# Patient Record
Sex: Female | Born: 1935 | Race: White | Hispanic: No | Smoking: Never smoker
Health system: Southern US, Community
[De-identification: ages and names within clinical notes are randomized; demographics above are authoritative.]

## PROBLEM LIST (undated history)

## (undated) DIAGNOSIS — Z860100 Personal history of colon polyps, unspecified: Secondary | ICD-10-CM

## (undated) DIAGNOSIS — B019 Varicella without complication: Secondary | ICD-10-CM

## (undated) DIAGNOSIS — C801 Malignant (primary) neoplasm, unspecified: Secondary | ICD-10-CM

## (undated) DIAGNOSIS — J45909 Unspecified asthma, uncomplicated: Secondary | ICD-10-CM

## (undated) DIAGNOSIS — Z85048 Personal history of other malignant neoplasm of rectum, rectosigmoid junction, and anus: Secondary | ICD-10-CM

## (undated) DIAGNOSIS — K921 Melena: Secondary | ICD-10-CM

## (undated) HISTORY — DX: Varicella without complication: B01.9

## (undated) HISTORY — PX: TONSILLECTOMY AND ADENOIDECTOMY: SUR1326

## (undated) HISTORY — DX: Melena: K92.1

---

## 1999-07-31 ENCOUNTER — Ambulatory Visit (HOSPITAL_COMMUNITY): Admission: RE | Admit: 1999-07-31 | Discharge: 1999-07-31 | Payer: Self-pay | Admitting: Obstetrics and Gynecology

## 1999-07-31 ENCOUNTER — Encounter: Payer: Self-pay | Admitting: Obstetrics and Gynecology

## 2000-05-03 ENCOUNTER — Encounter: Payer: Self-pay | Admitting: Specialist

## 2000-05-03 ENCOUNTER — Ambulatory Visit (HOSPITAL_COMMUNITY): Admission: RE | Admit: 2000-05-03 | Discharge: 2000-05-03 | Payer: Self-pay | Admitting: Specialist

## 2000-05-16 ENCOUNTER — Ambulatory Visit (HOSPITAL_COMMUNITY): Admission: RE | Admit: 2000-05-16 | Discharge: 2000-05-16 | Payer: Self-pay | Admitting: Internal Medicine

## 2000-05-16 ENCOUNTER — Encounter: Payer: Self-pay | Admitting: Internal Medicine

## 2001-01-29 ENCOUNTER — Other Ambulatory Visit: Admission: RE | Admit: 2001-01-29 | Discharge: 2001-01-29 | Payer: Self-pay | Admitting: Obstetrics and Gynecology

## 2002-02-13 ENCOUNTER — Encounter: Payer: Self-pay | Admitting: Obstetrics and Gynecology

## 2002-02-13 ENCOUNTER — Ambulatory Visit (HOSPITAL_COMMUNITY): Admission: RE | Admit: 2002-02-13 | Discharge: 2002-02-13 | Payer: Self-pay | Admitting: Obstetrics and Gynecology

## 2004-03-02 ENCOUNTER — Ambulatory Visit (HOSPITAL_COMMUNITY): Admission: RE | Admit: 2004-03-02 | Discharge: 2004-03-02 | Payer: Self-pay | Admitting: Obstetrics and Gynecology

## 2006-07-09 HISTORY — PX: OTHER SURGICAL HISTORY: SHX169

## 2006-12-27 ENCOUNTER — Ambulatory Visit: Payer: Self-pay | Admitting: Gastroenterology

## 2006-12-27 LAB — CONVERTED CEMR LAB
AST: 16 units/L (ref 0–37)
BUN: 15 mg/dL (ref 6–23)
Basophils Absolute: 0.1 10*3/uL (ref 0.0–0.1)
Bilirubin, Direct: 0.1 mg/dL (ref 0.0–0.3)
CEA: 33.6 ng/mL — ABNORMAL HIGH (ref 0.0–5.0)
CO2: 32 meq/L (ref 19–32)
Creatinine, Ser: 0.7 mg/dL (ref 0.4–1.2)
Eosinophils Absolute: 0.2 10*3/uL (ref 0.0–0.6)
GFR calc non Af Amer: 86 mL/min
Iron: 40 ug/dL — ABNORMAL LOW (ref 42–145)
Lymphocytes Relative: 13 % (ref 12.0–46.0)
Monocytes Absolute: 0.6 10*3/uL (ref 0.2–0.7)
Platelets: 280 10*3/uL (ref 150–400)
Potassium: 4.5 meq/L (ref 3.5–5.1)
RBC: 3.78 M/uL — ABNORMAL LOW (ref 3.87–5.11)
Saturation Ratios: 17.5 % — ABNORMAL LOW (ref 20.0–50.0)
Sodium: 142 meq/L (ref 135–145)
TSH: 1.64 microintl units/mL (ref 0.35–5.50)
Transferrin: 163.7 mg/dL — ABNORMAL LOW (ref >=212.0)
WBC: 6.3 10*3/uL (ref 4.5–10.5)

## 2007-01-01 ENCOUNTER — Encounter: Payer: Self-pay | Admitting: Gastroenterology

## 2007-01-01 ENCOUNTER — Ambulatory Visit: Payer: Self-pay | Admitting: Gastroenterology

## 2007-01-02 ENCOUNTER — Ambulatory Visit: Payer: Self-pay | Admitting: Cardiology

## 2007-01-14 DIAGNOSIS — Z85048 Personal history of other malignant neoplasm of rectum, rectosigmoid junction, and anus: Secondary | ICD-10-CM | POA: Diagnosis present

## 2007-01-15 ENCOUNTER — Inpatient Hospital Stay (HOSPITAL_COMMUNITY): Admission: RE | Admit: 2007-01-15 | Discharge: 2007-01-23 | Payer: Self-pay | Admitting: Surgery

## 2007-01-15 ENCOUNTER — Encounter (INDEPENDENT_AMBULATORY_CARE_PROVIDER_SITE_OTHER): Payer: Self-pay | Admitting: Surgery

## 2007-01-15 DIAGNOSIS — C20 Malignant neoplasm of rectum: Secondary | ICD-10-CM

## 2007-01-15 HISTORY — PX: LOW ANTERIOR BOWEL RESECTION: SUR1240

## 2007-01-15 HISTORY — DX: Malignant neoplasm of rectum: C20

## 2007-01-28 ENCOUNTER — Ambulatory Visit: Payer: Self-pay | Admitting: Oncology

## 2007-02-20 LAB — CBC WITH DIFFERENTIAL/PLATELET
BASO%: 0.6 % (ref 0.0–2.0)
LYMPH%: 16.2 % (ref 14.0–48.0)
MCV: 87.5 fL (ref 81.0–101.0)
MONO%: 7.7 % (ref 0.0–13.0)
NEUT#: 4.5 10*3/uL (ref 1.5–6.5)
NEUT%: 72.2 % (ref 39.6–76.8)
Platelets: 254 10*3/uL (ref 145–400)
RBC: 3.44 10*6/uL — ABNORMAL LOW (ref 3.70–5.32)
WBC: 6.3 10*3/uL (ref 3.9–10.0)

## 2007-02-20 LAB — CEA: CEA: <0.5 ng/mL (ref 0.0–5.0)

## 2007-02-20 LAB — IRON AND TIBC
%SAT: 10 % — ABNORMAL LOW (ref 20–55)
Iron: 22 ug/dL — ABNORMAL LOW (ref 42–145)
UIBC: 207 ug/dL

## 2007-02-20 LAB — COMPREHENSIVE METABOLIC PANEL
Alkaline Phosphatase: 61 U/L (ref 39–117)
CO2: 28 mEq/L (ref 19–32)
Chloride: 105 mEq/L (ref 96–112)
Creatinine, Ser: 0.82 mg/dL (ref 0.40–1.20)
Potassium: 4.1 mEq/L (ref 3.5–5.3)
Total Bilirubin: 0.5 mg/dL (ref 0.3–1.2)

## 2007-02-20 LAB — FERRITIN: Ferritin: 445 ng/mL — ABNORMAL HIGH (ref 10–291)

## 2007-02-20 LAB — LACTATE DEHYDROGENASE: LDH: 164 U/L (ref 94–250)

## 2007-02-26 ENCOUNTER — Ambulatory Visit: Admission: RE | Admit: 2007-02-26 | Discharge: 2007-04-08 | Payer: Self-pay | Admitting: Radiation Oncology

## 2007-03-20 ENCOUNTER — Ambulatory Visit: Payer: Self-pay | Admitting: Oncology

## 2007-03-20 LAB — CBC WITH DIFFERENTIAL/PLATELET
EOS%: 1.1 % (ref 0.0–7.0)
HGB: 11.3 g/dL — ABNORMAL LOW (ref 11.6–15.9)
MCV: 87.6 fL (ref 81.0–101.0)
NEUT#: 5.2 10*3/uL (ref 1.5–6.5)
NEUT%: 79 % — ABNORMAL HIGH (ref 39.6–76.8)
RBC: 3.84 10*6/uL (ref 3.70–5.32)
RDW: 15.6 % — ABNORMAL HIGH (ref 11.3–14.5)
WBC: 6.6 10*3/uL (ref 3.9–10.0)

## 2007-04-21 ENCOUNTER — Ambulatory Visit (HOSPITAL_COMMUNITY): Admission: RE | Admit: 2007-04-21 | Discharge: 2007-04-21 | Payer: Self-pay | Admitting: Obstetrics and Gynecology

## 2007-04-25 ENCOUNTER — Encounter: Admission: RE | Admit: 2007-04-25 | Discharge: 2007-04-25 | Payer: Self-pay | Admitting: Obstetrics and Gynecology

## 2007-05-16 ENCOUNTER — Ambulatory Visit: Payer: Self-pay | Admitting: Oncology

## 2007-05-20 LAB — COMPREHENSIVE METABOLIC PANEL
CO2: 29 mEq/L (ref 19–32)
Calcium: 9.4 mg/dL (ref 8.4–10.5)
Creatinine, Ser: 0.81 mg/dL (ref 0.40–1.20)
Glucose, Bld: 124 mg/dL — ABNORMAL HIGH (ref 70–99)
Sodium: 145 mEq/L (ref 135–145)
Total Bilirubin: 0.6 mg/dL (ref 0.3–1.2)

## 2007-05-20 LAB — CBC WITH DIFFERENTIAL/PLATELET
BASO%: 1 % (ref 0.0–2.0)
Basophils Absolute: 0.1 10*3/uL (ref 0.0–0.1)
EOS%: 1.4 % (ref 0.0–7.0)
HGB: 11.7 g/dL (ref 11.6–15.9)
MCHC: 33.3 g/dL (ref 32.0–36.0)
MONO#: 0.5 10*3/uL (ref 0.1–0.9)
RBC: 4.03 10*6/uL (ref 3.70–5.32)
RDW: 16.5 % — ABNORMAL HIGH (ref 11.3–14.5)
lymph#: 1 10*3/uL (ref 0.9–3.3)

## 2007-05-20 LAB — LACTATE DEHYDROGENASE: LDH: 149 U/L (ref 94–250)

## 2007-05-20 LAB — CEA: CEA: <0.5 ng/mL (ref 0.0–5.0)

## 2007-07-10 LAB — HM PAP SMEAR

## 2007-07-18 ENCOUNTER — Ambulatory Visit: Payer: Self-pay | Admitting: Oncology

## 2007-07-22 LAB — CBC WITH DIFFERENTIAL/PLATELET
BASO%: 0.6 % (ref 0.0–2.0)
EOS%: 1.5 % (ref 0.0–7.0)
HCT: 36.6 % (ref 34.8–46.6)
HGB: 12.1 g/dL (ref 11.6–15.9)
LYMPH%: 15.2 % (ref 14.0–48.0)
MCH: 29.3 pg (ref 26.0–34.0)
MCHC: 33.2 g/dL (ref 32.0–36.0)
MONO%: 6.8 % (ref 0.0–13.0)

## 2007-07-22 LAB — COMPREHENSIVE METABOLIC PANEL
CO2: 30 mEq/L (ref 19–32)
Calcium: 9.1 mg/dL (ref 8.4–10.5)
Chloride: 105 mEq/L (ref 96–112)
Glucose, Bld: 125 mg/dL — ABNORMAL HIGH (ref 70–99)
Sodium: 142 mEq/L (ref 135–145)
Total Bilirubin: 0.5 mg/dL (ref 0.3–1.2)
Total Protein: 7 g/dL (ref 6.0–8.3)

## 2007-07-22 LAB — LACTATE DEHYDROGENASE: LDH: 143 U/L (ref 94–250)

## 2007-07-22 LAB — CEA: CEA: <0.5 ng/mL (ref 0.0–5.0)

## 2007-10-16 ENCOUNTER — Ambulatory Visit: Payer: Self-pay | Admitting: Oncology

## 2007-10-21 LAB — CBC WITH DIFFERENTIAL/PLATELET
BASO%: 0.8 % (ref 0.0–2.0)
HGB: 13 g/dL (ref 11.6–15.9)
MCH: 30.4 pg (ref 26.0–34.0)
MONO#: 0.5 10*3/uL (ref 0.1–0.9)
MONO%: 7.1 % (ref 0.0–13.0)
NEUT#: 4.8 10*3/uL (ref 1.5–6.5)
Platelets: 230 10*3/uL (ref 145–400)
RBC: 4.27 10*6/uL (ref 3.70–5.32)
WBC: 6.5 10*3/uL (ref 3.9–10.0)
lymph#: 1 10*3/uL (ref 0.9–3.3)

## 2007-10-21 LAB — COMPREHENSIVE METABOLIC PANEL
BUN: 19 mg/dL (ref 6–23)
Creatinine, Ser: 0.83 mg/dL (ref 0.40–1.20)
Glucose, Bld: 107 mg/dL — ABNORMAL HIGH (ref 70–99)
Sodium: 143 mEq/L (ref 135–145)
Total Bilirubin: 0.6 mg/dL (ref 0.3–1.2)
Total Protein: 7.3 g/dL (ref 6.0–8.3)

## 2007-10-21 LAB — LACTATE DEHYDROGENASE: LDH: 149 U/L (ref 94–250)

## 2007-11-04 DIAGNOSIS — R1032 Left lower quadrant pain: Secondary | ICD-10-CM

## 2007-11-04 DIAGNOSIS — R142 Eructation: Secondary | ICD-10-CM

## 2007-11-04 DIAGNOSIS — R141 Gas pain: Secondary | ICD-10-CM

## 2007-11-04 DIAGNOSIS — R143 Flatulence: Secondary | ICD-10-CM

## 2008-02-17 ENCOUNTER — Ambulatory Visit: Payer: Self-pay | Admitting: Oncology

## 2008-03-23 ENCOUNTER — Ambulatory Visit: Payer: Self-pay | Admitting: Gastroenterology

## 2008-03-23 DIAGNOSIS — Z85038 Personal history of other malignant neoplasm of large intestine: Secondary | ICD-10-CM | POA: Insufficient documentation

## 2008-04-07 ENCOUNTER — Ambulatory Visit: Payer: Self-pay | Admitting: Gastroenterology

## 2008-04-12 ENCOUNTER — Encounter: Payer: Self-pay | Admitting: Gastroenterology

## 2008-04-12 ENCOUNTER — Ambulatory Visit: Payer: Self-pay | Admitting: Gastroenterology

## 2008-04-17 ENCOUNTER — Encounter: Payer: Self-pay | Admitting: Gastroenterology

## 2008-10-21 IMAGING — CT CT ABDOMEN W/ CM
2 of 7 series · 17 of 46 positions shown, 19 images · IV contrast (Omnipaque 300)
Comparison: None.

ABDOMEN CT WITH CONTRAST

CLINICAL DATA: Heavy rectal bleeding for the past 2 months. Intermittent
abdominal pain and diarrhea.
TECHNIQUE: Multidetector CT imaging of the abdomen and pelvis was performed
following the standard protocol during bolus administration of intravenous
contrast.

Contrast:  100 cc Omnipaque 300

[Series 2: abd_pel 5.0 b30f st · axial · 0.68mm/px · z∈[-395,-40]mm · 14 of 81 slices shown, 16 images]
[im 5/81  soft-tissue]
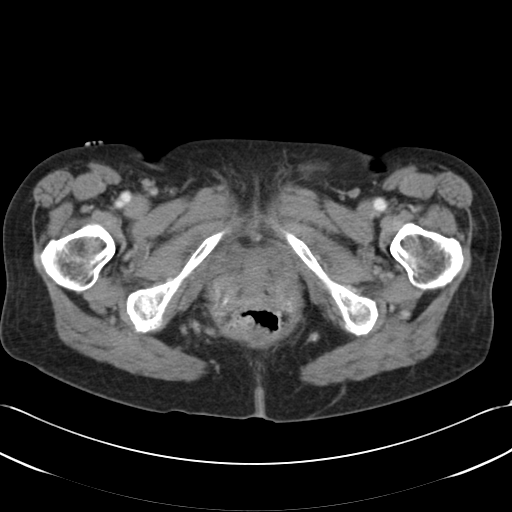
[im 5/81  bone]
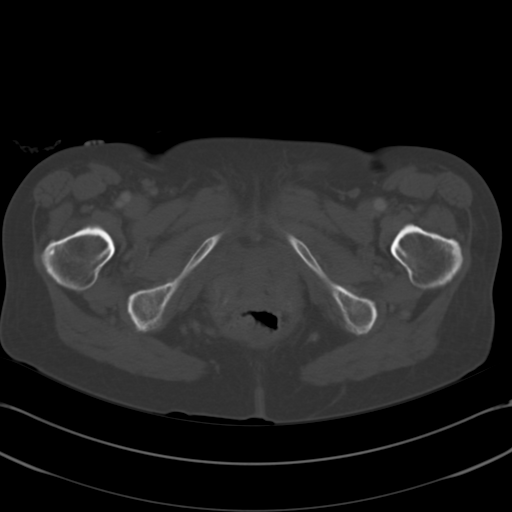
[im 9/81  soft-tissue]
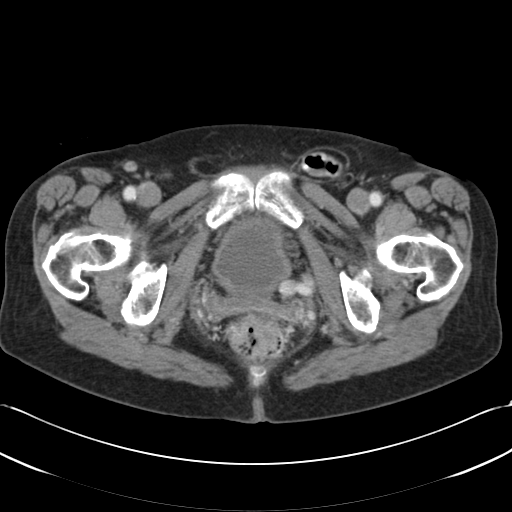
[im 18/81  soft-tissue]
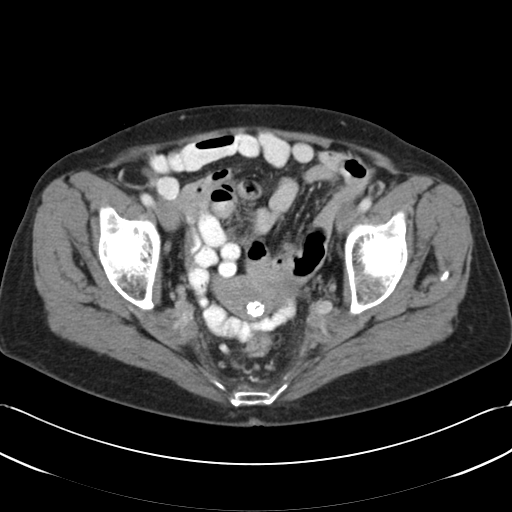
[im 23/81  soft-tissue]
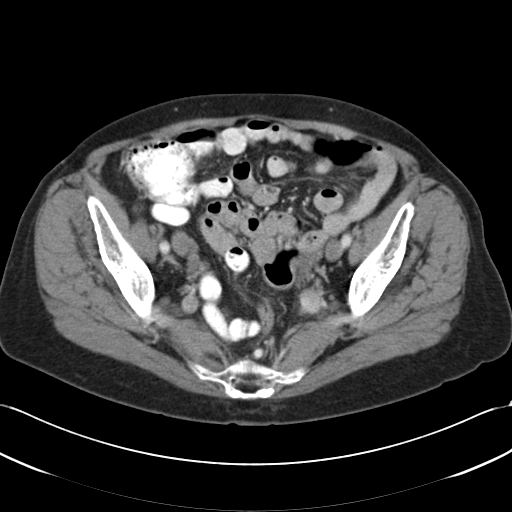
[im 27/81  soft-tissue]
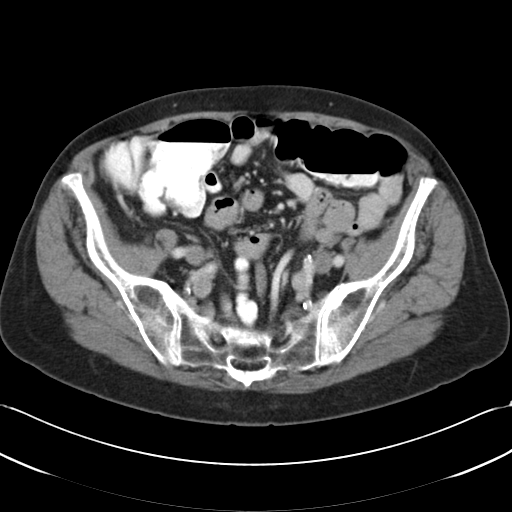
[im 32/81  soft-tissue]
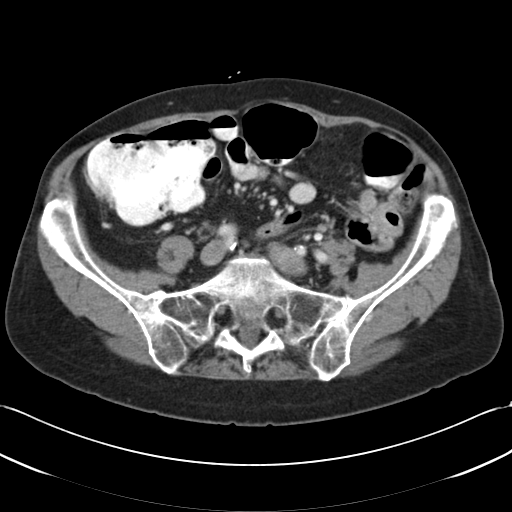
[im 36/81  soft-tissue]
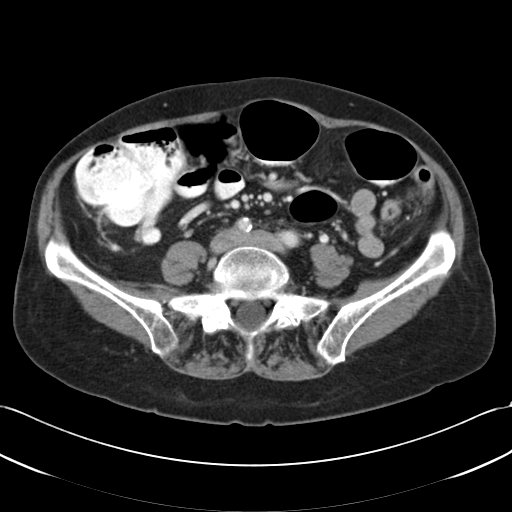
[im 45/81  soft-tissue]
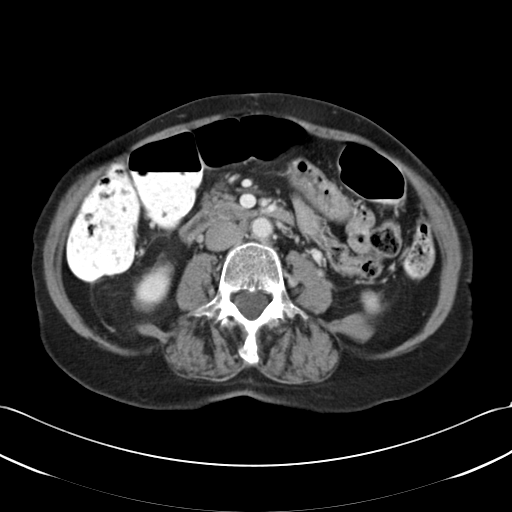
[im 49/81  soft-tissue]
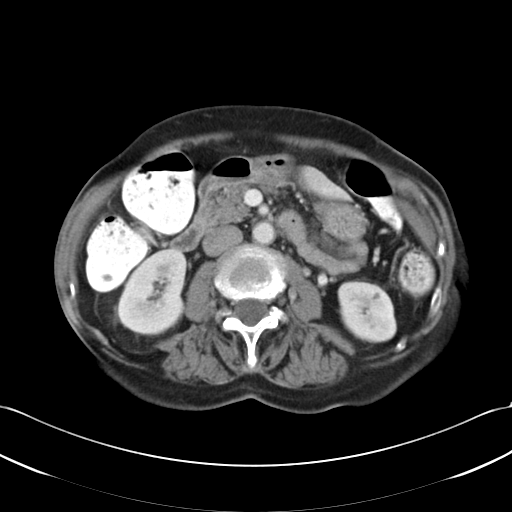
[im 49/81  bone]
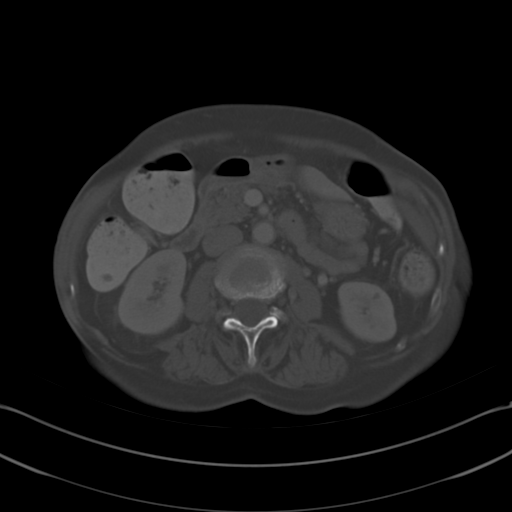
[im 54/81  soft-tissue]
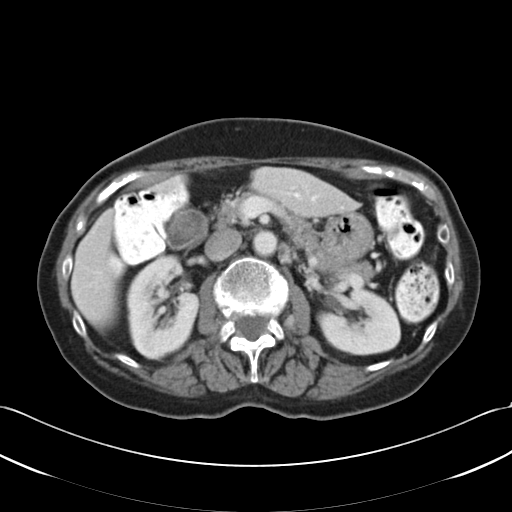
[im 58/81  soft-tissue]
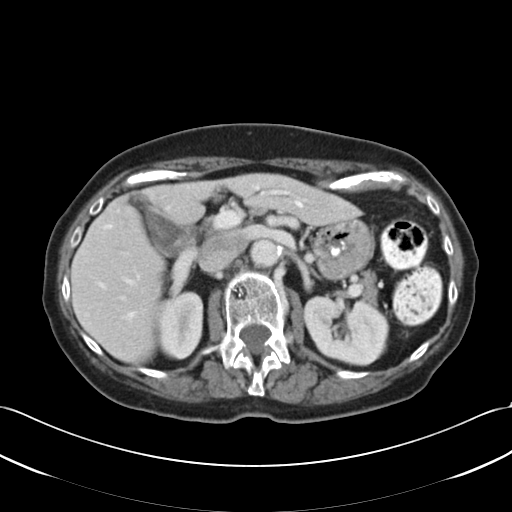
[im 63/81  soft-tissue]
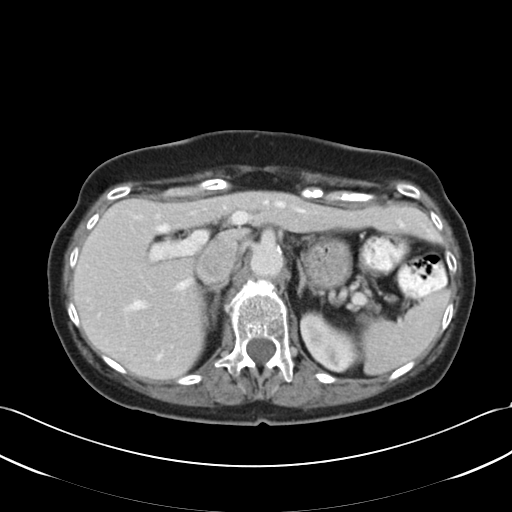
[im 72/81  soft-tissue]
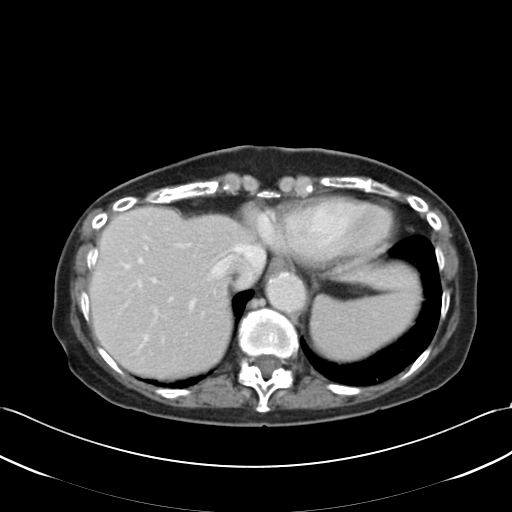
[im 76/81  soft-tissue]
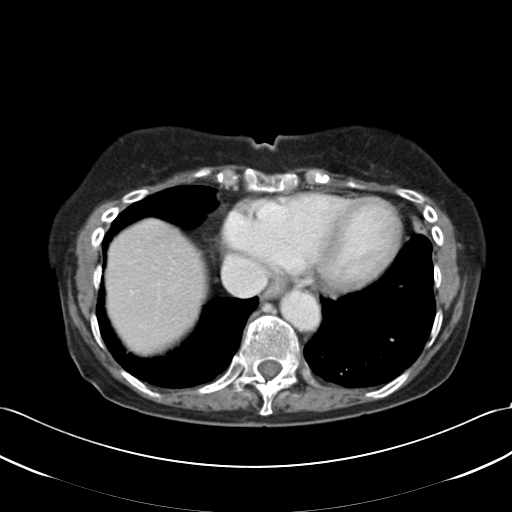

[Series 602: <mpr thick range> · coronal · 0.85mm/px · 3 of 72 slices shown]
[im 18/72  soft-tissue]
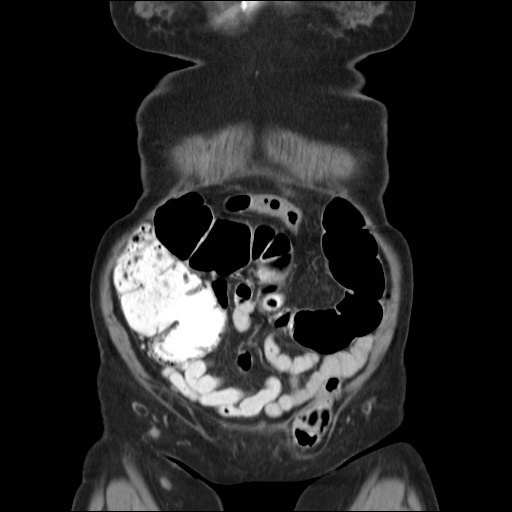
[im 36/72  soft-tissue]
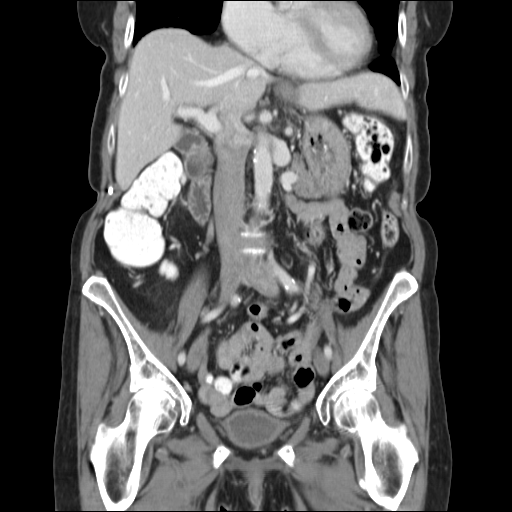
[im 54/72  soft-tissue]
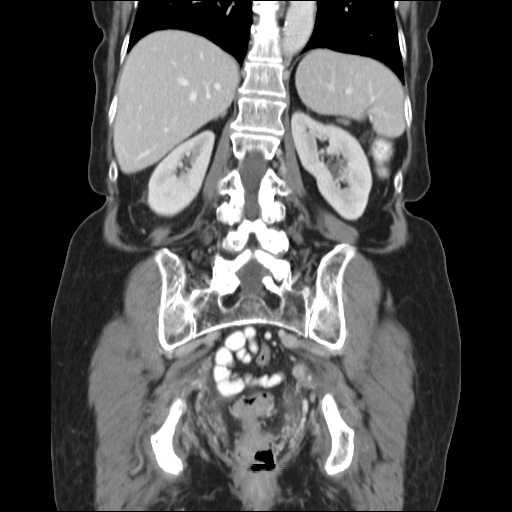

[17 of 46 positions shown; findings below may reference images not displayed]

FINDINGS: 2.3 cm oval area of mild soft tissue density within the gallbladder.
No gallbladder wall thickening or pericholecystic fluid. Tiny lower pole left
renal cyst. Unremarkable liver, spleen, pancreas, and adrenal glands and left
kidney. No intestinal abnormalities or enlarged lymph nodes. Specifically, no
colon mass visualized. Clear lung bases. Lumbar and lower thoracic spine
degenerative changes.

IMPRESSION

2.3 cm probable sludge ball in the gallbladder. No acute abnormality.

PELVIS CT WITH CONTRAST
FINDINGS: Small, calcified posterior uterine fibroid. Normal-appearing ovaries
urinary bladder. Left inguinal hernia containing a small bowel loop without
dilatation or wall thickening. Unremarkable bones.

IMPRESSION

Left inguinal hernia containing a small bowel loop without obstruction.

## 2009-03-17 ENCOUNTER — Encounter: Payer: Self-pay | Admitting: Gastroenterology

## 2009-11-15 ENCOUNTER — Ambulatory Visit: Payer: Self-pay | Admitting: Gastroenterology

## 2009-11-15 LAB — CONVERTED CEMR LAB
ALT: 14 units/L (ref 0–35)
AST: 22 units/L (ref 0–37)
Alkaline Phosphatase: 62 units/L (ref 39–117)
Basophils Absolute: 0.1 10*3/uL (ref 0.0–0.1)
CEA: 0.7 ng/mL (ref 0.0–5.0)
CO2: 33 meq/L — ABNORMAL HIGH (ref 19–32)
Creatinine, Ser: 0.7 mg/dL (ref 0.4–1.2)
GFR calc non Af Amer: 80.78 mL/min (ref >60)
Hemoglobin: 13 g/dL (ref 12.0–15.0)
Lymphocytes Relative: 15.8 % (ref 12.0–46.0)
MCHC: 33.9 g/dL (ref 30.0–36.0)
MCV: 93.2 fL (ref 78.0–100.0)
Monocytes Relative: 8.7 % (ref 3.0–12.0)
Neutrophils Relative %: 73 % (ref 43.0–77.0)
RBC: 4.13 M/uL (ref 3.87–5.11)
RDW: 15.3 % — ABNORMAL HIGH (ref 11.5–14.6)
Saturation Ratios: 19 % — ABNORMAL LOW (ref 20.0–50.0)
TSH: 1.81 microintl units/mL (ref 0.35–5.50)
Total Protein: 6.9 g/dL (ref 6.0–8.3)
Vitamin B-12: 570 pg/mL (ref 211–911)
WBC: 6.6 10*3/uL (ref 4.5–10.5)

## 2009-11-23 ENCOUNTER — Telehealth: Payer: Self-pay | Admitting: Gastroenterology

## 2010-07-30 ENCOUNTER — Encounter (HOSPITAL_COMMUNITY): Payer: Self-pay | Admitting: Oncology

## 2010-08-10 NOTE — Procedures (Signed)
Summary: Recall / [facility]  Recall / [facility]   Imported By: Lennie Odor 12/08/2009 16:49:21  _____________________________________________________________________  External Attachment:    Type:   Image     Comment:   External Document

## 2010-08-10 NOTE — Progress Notes (Signed)
Summary: results  Phone Note Call from Patient Call back at Home Phone [phone]   Caller: Patient Call For: Jarold Motto Reason for Call: Talk to Nurse, Lab or Test Results Summary of Call: Patient would like lab results from 5-10 Initial call taken by: Tawni Levy,  Nov 23, 2009 2:03 PM  Follow-up for Phone Call        Discussed lab results with pt.  Follow-up by: Ashok Cordia RN,  Nov 23, 2009 2:20 PM

## 2010-08-10 NOTE — Assessment & Plan Note (Signed)
Summary: recall office visit, hx colon cancer/lk   History of Present Illness Visit Type: Follow-up Visit Primary GI MD: Sheryn Bison MD FACP FAGA Primary Provider: n/a Requesting Provider: n/a Chief Complaint: Patient here for routine office visit. She has a history of colon cancer. She currently has no GI complaints. History of Present Illness:   77 year old Caucasian female status post low anterior resection for rectal carcinoma in July of 2008. She's had followup colonoscopy proximally year ago which was unremarkable including biopsies of the anastomotic area. She is status post chemotherapy by Dr. Arline Asp. Actually review her record shows no evidence of chemoradiation therapy. She currently has  regular bowel movements and denies abdominal pain, rectal bleeding, or any systemic symptomatology. She doesn't have a primary care physician.She  is not on any medications currently.   GI Review of Systems      Denies abdominal pain, acid reflux, belching, bloating, chest pain, dysphagia with liquids, dysphagia with solids, heartburn, loss of appetite, nausea, vomiting, vomiting blood, weight loss, and  weight gain.        Denies anal fissure, black tarry stools, change in bowel habit, constipation, diarrhea, diverticulosis, fecal incontinence, heme positive stool, hemorrhoids, irritable bowel syndrome, jaundice, light color stool, liver problems, rectal bleeding, and  rectal pain. Preventive Screening-Counseling & Management  Caffeine-Diet-Exercise     Does Patient Exercise: yes    Current Medications (verified): 1)  None  Allergies (verified): 1)  ! Levaquin  Past History:  Past medical, surgical, family and social histories (including risk factors) reviewed for relevance to current acute and chronic problems.  Past Medical History: Reviewed history from 03/23/2008 and no changes required. Current Problems:   ABDOMINAL PAIN, LEFT LOWER QUADRANT (ICD-789.04) ABDOMINAL  BLOATING (ICD-787.3)  Past Surgical History: Reviewed history from 03/23/2008 and no changes required. colon ca surgery low anterior resection 01/15/2007.for adenocarcinoma of the rectum. Tonsillectomy  Family History: Reviewed history from 03/22/2008 and no changes required. No FH of Colon Cancer: Lung Cancer: Father  Social History: Reviewed history from 03/22/2008 and no changes required. Occupation: retired from [employer] Three children one son and 2 daughters Patient has never smoked.  Alcohol Use - no Illicit Drug Use - no Patient gets regular exercise.  Review of Systems       The patient complains of muscle pains/cramps.  The patient denies allergy/sinus, anemia, anxiety-new, arthritis/joint pain, back pain, blood in urine, breast changes/lumps, change in vision, confusion, cough, coughing up blood, depression-new, fainting, fatigue, fever, headaches-new, hearing problems, heart murmur, heart rhythm changes, itching, menstrual pain, night sweats, nosebleeds, pregnancy symptoms, shortness of breath, skin rash, sleeping problems, sore throat, swelling of feet/legs, swollen lymph glands, thirst - excessive , urination - excessive , urination changes/pain, urine leakage, vision changes, and voice change.    Vital Signs:  Patient profile:   77 year old female Height:      64 inches Weight:      129 pounds BMI:     22.22 BSA:     1.62 Pulse rate:   80 / minute Pulse rhythm:   regular BP sitting:   176 / 70  (left arm) Cuff size:   regular  Vitals Entered By: Lamona Curl CMA Duncan Dull) (Nov 15, 2009 2:51 PM)  Physical Exam  General:  Well developed, well nourished, no acute distress.healthy appearing.   Head:  Normocephalic and atraumatic. Eyes:  PERRLA, no icterus.exam deferred to patient's ophthalmologist.   Abdomen:  Soft, nontender and nondistended. No masses, hepatosplenomegaly or hernias  noted. Normal bowel sounds. Rectal:  inspection of the perirectal  area is unremarkable. Rectal exam shows some stenosis in the rectum at the anastomotic site, but I was able to insert my digit without problems. There is no friability or rectal bleeding. Stools guaiac negative. Psych:  Alert and cooperative. Normal mood and affect.depressed affect.     Impression & Recommendations:  Problem # 1:  PERSONAL HX COLON CANCER (ICD-V10.05) Assessment Unchanged Labs ordered including CEA level. I have offered to establish her with care with primary care,but she refused. She also does not want her records sent to oncology. She seems to be doing well at this time and I will see her yearly basis. I do not think colonoscopy is needed just yet. This may change depending on her lab review. Orders: TLB-CBC Platelet - w/Differential (85025-CBCD) TLB-BMP (Basic Metabolic Panel-BMET) (80048-METABOL) TLB-Hepatic/Liver Function Pnl (80076-HEPATIC) TLB-TSH (Thyroid Stimulating Hormone) (84443-TSH) TLB-B12, Serum-Total ONLY (41324-M01) TLB-Ferritin (82728-FER) TLB-Folic Acid (Folate) (82746-FOL) TLB-IBC Pnl (Iron/FE;Transferrin) (83550-IBC) TLB-CEA (Carcinoembryonic Antigen) (82378-CEA)  Problem # 2:  ABDOMINAL PAIN, LEFT LOWER QUADRANT (ICD-789.04) Assessment: Improved  Orders: TLB-CBC Platelet - w/Differential (85025-CBCD) TLB-BMP (Basic Metabolic Panel-BMET) (80048-METABOL) TLB-Hepatic/Liver Function Pnl (80076-HEPATIC) TLB-TSH (Thyroid Stimulating Hormone) (84443-TSH) TLB-B12, Serum-Total ONLY (02725-D66) TLB-Ferritin (82728-FER) TLB-Folic Acid (Folate) (82746-FOL) TLB-IBC Pnl (Iron/FE;Transferrin) (83550-IBC) TLB-CEA (Carcinoembryonic Antigen) (82378-CEA)  Patient Instructions: 1)  Please go to the basement for lab work. 2)  Please continue current medications.  3)  The medication list was reviewed and reconciled.  All changed / newly prescribed medications were explained.  A complete medication list was provided to the patient / caregiver. 4)  Please schedule  a follow-up appointment in 1 year.

## 2010-11-21 NOTE — Assessment & Plan Note (Signed)
[facility]                         GASTROENTEROLOGY OFFICE NOTE   NAME:[name], [name]                        MRN:          [id number]  DATE:12/27/2006                            DOB:          [birth year]    [name] is a very nice 77 year old white female retiree referred by  Dr. Ambrose Faulkner for hematochezia.   [name] has had rectal bleeding almost daily for the last 3 months.  She saw Dr. Ambrose Faulkner yesterday who removed a pessary from her vaginal area  that has been present for several years.  On rectal exam, she was noted  to have a rectal mass.  The patient denies GI symptoms except for some  gas, bloating, and occasional left lower quadrant discomfort.  She has  never had colonoscopy or barium studies.  She says her appetite is good  and her weight is stable, and she denies any systemic complaints such as  fever, chills, anorexia, weight loss, etc.  She denies upper GI or  hepatobiliary problems.   PAST MEDICAL HISTORY:  She is a surprisingly healthy white female who  really has had no medical problems and is on no medications.   FAMILY HISTORY:  Remarkable for lung cancer in her father, but no known  gastrointestinal problems.   SOCIAL HISTORY:  The patient is married and lives with her husband.  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  She  does not smoke or use ethanol.   REVIEW OF SYSTEMS:  Noncontributory except for some nonspecific edema of  her lower extremities and some mild urinary incontinence.  She receives  her gynecologic care per Dr. Ambrose Faulkner.   She is a healthy-appearing white female appearing younger than her  stated age in no acute distress.  She is 5 feet 4 inches tall and weighs 121 pounds.  Blood pressure is  118/72 and pulse was 88 and regular.  There is no stigmata of chronic liver disease noted.  She appeared to be in a regular rhythm without murmurs, gallops, or  rubs.  Her chest was  clear.  I could not appreciate hepatosplenomegaly, abdominal masses, or  tenderness.  Bowel sounds were normal.  Inspection of the rectum was unremarkable.  RECTAL EXAM:  Did show a polypoid, firm, indurated mass in the rectum  with guaiac-positive stools.  Peripheral extremities were unremarkable.  Mental status was clear.   ASSESSMENT:  [name] most likely has a rectal adenocarcinoma per  her symptomatology and rectal exam.   RECOMMENDATIONS:  I have sent her by the lab to:  1. Check repeat lab tests including iron studies.  2. Outpatient colonoscopy as soon as possible for diagnostic purposes.  3. Consider abdominal/pelvic CT scans once colposcopy is completed.     [name]. Jarold Motto, MD, Caleen Essex, FAGA  Electronically Signed    DRP/MedQ  DD: 12/27/2006  DT: 12/27/2006  Job #: [id number]   cc:   [name], M.D.

## 2010-11-21 NOTE — Discharge Summary (Signed)
[name], [name]               ACCOUNT NO.:  [id number]   MEDICAL RECORD NO.:  [id number]          PATIENT TYPE:  INP   LOCATION:  1522                         FACILITY:  [facility]   PHYSICIAN:  Wilmon Arms. Corliss Skains, M.D. DATE OF BIRTH:  24-Feb-1924   DATE OF ADMISSION:  01/15/2007  DATE OF DISCHARGE:  01/23/2007                               DISCHARGE SUMMARY   ADMISSION DIAGNOSIS:  Rectal carcinoma   DISCHARGE DIAGNOSIS:  Rectal carcinoma   BRIEF HISTORY:  The patient is an 77 year old female in good health who  presents with gross blood per rectum over the last 3 months.  She  underwent a colonoscopy on 01/01/2007.  This showed a circumferential  fungating mass in the proximal rectum.  Biopsies confirmed  adenocarcinoma.  She was referred for surgical evaluation.   HOSPITAL COURSE:  The patient underwent a bowel prep at home.  She is  admitted to the hospital on 01/15/2007 and underwent a low anterior  resection.  She had a low stapled anastomosis approximately 3 cm above  the dentate line.  Her pathology showed invasive adenocarcinoma with  negative margins.  17 lymph nodes were examined and these were all  negative.  Postoperatively the patient had an inadvertent postop dose of  Lovenox the evening of her surgery.  This may have contributed to some  oozing from her midline incision.  This was addressed with additional  sutures in the lower midline which succeeded in stopping this bleeding.  The patient was managed conservatively with ice chips and sips of  liquids until postoperative day number five.  She began having flatus.  Her diet was slowly increased and she was transitioned to oral pain  medication.  She was given Milk of Magnesia and oral Dulcolax which  resulted in some diarrhea.  On postoperative day #7 the patient had  significant loose stool.  All of her laxatives were stopped and she was  started fiber supplement.  Her clostridium difficile titer was negative.  She  was discharged home on postop day #8.  Her wound looked good.  Her  bowel movements had decreased in frequency.  She was able to control her  bowel movements without difficulty.   DISCHARGE INSTRUCTIONS:  To follow-up with Dr. Corliss Skains in 1 week for  staple removal.  She should use fiber supplement as needed.  She may  also use Imodium as needed for diarrhea.  She is given a prescription  for Vicodin p.r.n. for pain.  She should refrain from any heavy lifting.      Wilmon Arms. Tsuei, M.D.  Electronically Signed     MKT/MEDQ  D:  02/08/2007  T:  02/09/2007  Job:  [id number]   cc:   Vania Rea. Jarold Motto, MD, FACG, FACP, FAGA  [address]  [place]  [place] [address]

## 2010-11-21 NOTE — Op Note (Signed)
[name], [name]               ACCOUNT NO.:  [id number]   MEDICAL RECORD NO.:  [id number]          PATIENT TYPE:  INP   LOCATION:  0004                         FACILITY:  [facility]   PHYSICIAN:  Wilmon Arms. Corliss Skains, M.D. DATE OF BIRTH:  1924-04-08   DATE OF PROCEDURE:  01/15/2007  DATE OF DISCHARGE:                               OPERATIVE REPORT   PREOPERATIVE DIAGNOSIS:  Rectal carcinoma.   POSTOPERATIVE DIAGNOSIS:  Rectal carcinoma.   PROCEDURE:  Low anterior resection.   SURGEON:  Wilmon Arms. Corliss Skains, M.D.   ASSISTANT:  Anselm Pancoast. Zachery Dakins, M.D.   ANESTHESIA:  General endotracheal.   INDICATIONS:  The patient is a 77 year old female in remarkably good  health who presents with gross blood per rectum over the last three  months.  She underwent evaluation by Dr. Sheryn Bison who performed a  colonoscopy in 01/01/2007.  This showed a circumferential fungating mass  in the proximal rectum.  Biopsies confirmed adenocarcinoma.  The  remainder of the colon was normal.  She is now referred for surgical  evaluation.  Preoperative CEA level was 33.6.  Hemoglobin was 11.0.   DESCRIPTION OF PROCEDURE:  The patient underwent a 2-day bowel prep at  home.  She was admitted to hospital on 01/15/2007 and was brought to the  operating room.  After an adequate level of general anesthesia was  obtained, the patient's legs were placed in yellow fin stirrups in  lithotomy position.  Foley catheter was placed under sterile technique.  The patient's abdomen was then prepped with Betadine and draped in  sterile fashion.  A time-out was taken assure proper patient, proper  procedure.  A lower midline incision was made from umbilicus to the  symphysis pubis.  Dissection was carried down to the fascia which was  opened vertically.  The peritoneal cavity was entered.  A Balfour  retractor was then inserted.  There were no adhesions in the abdomen.  The patient's sigmoid colon was very redundant.  I  palpated the liver.  There were no obvious metastatic lesions noted.  Her small bowel and  omentum appeared normal.  The small bowel and omentum were packed away  in the upper part of the abdomen and packed away behind the Balfour  expander retractor.  The distal sigmoid colon was divided with a GIA 55  stapler.  The mesocolon was taken with the LigaSure device.  We opened  up the peritoneum on both sides of the rectum heading down to the  pelvis.  We were able to identify both ureters and these were preserved  laterally.  We continued dissecting down into the presacral space.  Blunt dissection was used to continued this dissection only down to the  pelvic floor.  We took the lateral stalks with the LigaSure device.  The  uterus was retracted anteriorly and the peritoneum was opened across the  front of the rectum.  We continued this dissection all the way down  until we were at the levator muscles.  We could palpate the tumor in the  rectum.  We continued dissecting we were past the  tumor.  The rectum was  then divided very low with a contour stapler with green staple load.  The rectum was passed off the field and opened on the back table.  It  appeared that we had a clear margin all the way around the tumor.  The  specimen was sent for pathologic examination.  The pelvis was then  thoroughly irrigated with saline.  No bleeding was noted.  We amputated  the end of the staple line and the proximal sigmoid colon.  A 29 mm EEA  anvil was inserted and a pursestring suture of 2-0 Prolene was placed  around the end of the proximal sigmoid colon.  This was tied down around  the shaft of the anvil.  I then went below and dilated the anus up to  three fingers.  We passed a 29 mm EEA stapler and performed a low  stapled anastomosis.  This was only about 3 cm above the dentate line.  The pelvis was then filled with saline.  I gently inserted rigid  proctoscope and inflated the rectum with air.  No  air bubbles were  noted.  The staple line appeared to be intact with no sign of bleeding.  The air was released and the scope was removed.  We then counted all of  our sponges which were removed from the abdomen.  The abdomen was then  thoroughly irrigated with saline.  A #1 PDS suture was used to close the  fascia.  Staples were used to close the skin.  Clean dressing was  applied.  The patient was then extubated and brought to recovery in  stable condition.  All sponge, instrument, needle counts correct.      Wilmon Arms. Tsuei, M.D.  Electronically Signed     MKT/MEDQ  D:  01/15/2007  T:  01/16/2007  Job:  [id number]   cc:   Vania Rea. Jarold Motto, MD, FACG, FACP, FAGA  [address]  [place]  [place] [address]

## 2011-04-23 LAB — CLOSTRIDIUM DIFFICILE EIA

## 2011-04-24 LAB — DIFFERENTIAL
Eosinophils Relative: 2
Lymphocytes Relative: 13
Lymphs Abs: 0.8

## 2011-04-24 LAB — CBC
HCT: 30.8 — ABNORMAL LOW
HCT: 32.8 — ABNORMAL LOW
Hemoglobin: 11 — ABNORMAL LOW
MCHC: 33.2
MCHC: 33.3
MCV: 88
Platelets: 299
RBC: 3.49 — ABNORMAL LOW
RBC: 3.73 — ABNORMAL LOW
RDW: 15.4 — ABNORMAL HIGH
RDW: 15.6 — ABNORMAL HIGH
WBC: 15.9 — ABNORMAL HIGH
WBC: 6.1

## 2011-04-24 LAB — COMPREHENSIVE METABOLIC PANEL
ALT: 13
AST: 18
Albumin: 2.8 — ABNORMAL LOW
Alkaline Phosphatase: 59
BUN: 16
CO2: 28
Calcium: 8.9
Chloride: 107
Creatinine, Ser: 0.74
GFR calc Af Amer: >60
GFR calc non Af Amer: >60
Glucose, Bld: 86
Potassium: 3.8
Sodium: 139
Total Bilirubin: 0.8
Total Protein: 6.8

## 2011-04-24 LAB — TYPE AND SCREEN
ABO/RH(D): O POS
Antibody Screen: NEGATIVE

## 2011-04-24 LAB — PROTIME-INR
INR: 1
Prothrombin Time: 13.4

## 2011-04-24 LAB — ABO/RH: ABO/RH(D): O POS

## 2012-02-26 ENCOUNTER — Inpatient Hospital Stay (HOSPITAL_COMMUNITY)
Admission: EM | Admit: 2012-02-26 | Discharge: 2012-02-27 | DRG: 603 | Disposition: A | Discharge status: Home / Self Care | Payer: Medicare Other | Attending: Internal Medicine | Admitting: Internal Medicine

## 2012-02-26 ENCOUNTER — Encounter (HOSPITAL_COMMUNITY): Payer: Self-pay | Admitting: *Deleted

## 2012-02-26 DIAGNOSIS — Z85048 Personal history of other malignant neoplasm of rectum, rectosigmoid junction, and anus: Secondary | ICD-10-CM

## 2012-02-26 DIAGNOSIS — M7989 Other specified soft tissue disorders: Secondary | ICD-10-CM

## 2012-02-26 DIAGNOSIS — L0390 Cellulitis, unspecified: Secondary | ICD-10-CM

## 2012-02-26 DIAGNOSIS — R030 Elevated blood-pressure reading, without diagnosis of hypertension: Secondary | ICD-10-CM

## 2012-02-26 DIAGNOSIS — L03115 Cellulitis of right lower limb: Secondary | ICD-10-CM

## 2012-02-26 DIAGNOSIS — I10 Essential (primary) hypertension: Secondary | ICD-10-CM | POA: Diagnosis present

## 2012-02-26 DIAGNOSIS — L03119 Cellulitis of unspecified part of limb: Secondary | ICD-10-CM

## 2012-02-26 DIAGNOSIS — M79609 Pain in unspecified limb: Secondary | ICD-10-CM

## 2012-02-26 DIAGNOSIS — L02419 Cutaneous abscess of limb, unspecified: Principal | ICD-10-CM | POA: Diagnosis present

## 2012-02-26 HISTORY — DX: Personal history of colon polyps, unspecified: Z86.0100

## 2012-02-26 HISTORY — DX: Unspecified asthma, uncomplicated: J45.909

## 2012-02-26 HISTORY — DX: Malignant (primary) neoplasm, unspecified: C80.1

## 2012-02-26 HISTORY — DX: Personal history of colonic polyps: Z86.010

## 2012-02-26 HISTORY — DX: Personal history of other malignant neoplasm of rectum, rectosigmoid junction, and anus: Z85.048

## 2012-02-26 LAB — CBC
MCH: 30.6 pg (ref 26.0–34.0)
Platelets: 247 10*3/uL (ref 150–400)
RBC: 4.21 MIL/uL (ref 3.87–5.11)
RDW: 14.2 % (ref 11.5–15.5)
WBC: 9 10*3/uL (ref 4.0–10.5)

## 2012-02-26 LAB — BASIC METABOLIC PANEL
CO2: 29 mEq/L (ref 19–32)
Calcium: 9 mg/dL (ref 8.4–10.5)
Chloride: 103 mEq/L (ref 96–112)
GFR calc Af Amer: 89 mL/min — ABNORMAL LOW (ref >90)
Sodium: 139 mEq/L (ref 135–145)

## 2012-02-26 MED ORDER — ENOXAPARIN SODIUM 40 MG/0.4ML ~~LOC~~ SOLN
40.0000 mg | SUBCUTANEOUS | Status: DC
  Administered 2012-02-26: 40 mg via SUBCUTANEOUS
  Filled 2012-02-26 (×2): qty 0.4

## 2012-02-26 MED ORDER — HYDRALAZINE HCL 25 MG PO TABS
25.0000 mg | ORAL_TABLET | Freq: Three times a day (TID) | ORAL | Status: DC | PRN
  Administered 2012-02-26: 25 mg via ORAL
  Filled 2012-02-26 (×2): qty 1

## 2012-02-26 MED ORDER — CLINDAMYCIN PHOSPHATE 300 MG/50ML IV SOLN
300.0000 mg | Freq: Three times a day (TID) | INTRAVENOUS | Status: DC
  Filled 2012-02-26: qty 50

## 2012-02-26 MED ORDER — SODIUM CHLORIDE 0.9 % IJ SOLN: 3.0000 mL | Freq: Two times a day (BID) | INTRAMUSCULAR | Status: DC

## 2012-02-26 MED ORDER — VANCOMYCIN HCL IN DEXTROSE 1-5 GM/200ML-% IV SOLN
1000.0000 mg | Freq: Once | INTRAVENOUS | Status: AC
  Administered 2012-02-26: 1000 mg via INTRAVENOUS
  Filled 2012-02-26: qty 200

## 2012-02-26 MED ORDER — ACETAMINOPHEN 650 MG RE SUPP: 650.0000 mg | Freq: Four times a day (QID) | RECTAL | Status: DC | PRN

## 2012-02-26 MED ORDER — CLINDAMYCIN PHOSPHATE 600 MG/50ML IV SOLN
600.0000 mg | Freq: Three times a day (TID) | INTRAVENOUS | Status: DC
  Administered 2012-02-26 – 2012-02-27 (×3): 600 mg via INTRAVENOUS
  Filled 2012-02-26 (×4): qty 50

## 2012-02-26 MED ORDER — DEXTROSE 5 % IV SOLN
1.0000 g | Freq: Once | INTRAVENOUS | Status: AC
  Administered 2012-02-26: 1 g via INTRAVENOUS
  Filled 2012-02-26: qty 10

## 2012-02-26 MED ORDER — SODIUM CHLORIDE 0.9 % IV SOLN: 250.0000 mL | INTRAVENOUS | Status: DC | PRN

## 2012-02-26 MED ORDER — ACETAMINOPHEN 325 MG PO TABS: 650.0000 mg | ORAL_TABLET | Freq: Four times a day (QID) | ORAL | Status: DC | PRN

## 2012-02-26 MED ORDER — SODIUM CHLORIDE 0.9 % IJ SOLN: 3.0000 mL | INTRAMUSCULAR | Status: DC | PRN

## 2012-02-26 NOTE — Progress Notes (Signed)
[name], is a 79 y.o. female,   MRN: [id number]  -  DOB - 1923-12-21  Outpatient Primary MD for the patient is No primary provider on file.  in for    Chief Complaint  Patient presents with  . Leg Swelling     Blood pressure 204/77, pulse 84, temperature 98.1 F (36.7 C), temperature source Oral, resp. rate 16, SpO2 99.00%.  Principal Problem:  *Cellulitis Active Problems:  Swelling of left lower extremity  HTN (hypertension)   78 yo hx rectal CA, asthma, presents to ED with cc right lower extremity swelling/redness/pain. States started 3-4 days ago and has gotten worse. RLE pain with weight bearing. Also some decreased po intake and 2 episodes of diarrhea this am. Denies CP, SOB. Has been sitting prolonged periods as her husband is pt intubated in ICU.   In Ed RLE duplex without evidence DVT. Rt LE with erythema from ankle to mid calf. Warm/tender to touch.   SBP 200 no hx HTN. Other vitals stable. Non toxic appearing  Will admit to obs for IV antibiotics and BP monitoring.

## 2012-02-26 NOTE — ED Provider Notes (Signed)
History     CSN: [id number]  Arrival date & time 02/26/12  1107   First MD Initiated Contact with Patient 02/26/12 1123      Chief Complaint  Patient presents with  . Leg Swelling     The history is provided by the patient.   the patient reports worsening swelling of her right lower extremity over the past 3 days.  She's developed a redness and irritation this area.  She reports painful.  No prior history of DVT or pulmonary embolism.  She denies chest pain or shortness of breath.  Denies numbness or tingling in her right lower extremity.  No recent trauma.  She denies fevers or chills.  Her pain is worsened by movement and palpation.  Nothing improves her symptoms.  She reports the redness is spreading.  History reviewed. No pertinent past medical history.  History reviewed. No pertinent past surgical history.  No family history on file.  History  Substance Use Topics  . Smoking status: Never Smoker   . Smokeless tobacco: Not on file  . Alcohol Use: No    OB History    Grav Para Term Preterm Abortions TAB SAB Ect Mult Living                  Review of Systems  All other systems reviewed and are negative.    Allergies  Levofloxacin  Home Medications  No current outpatient prescriptions on file.  BP 204/77  Pulse 84  Temp 98.1 F (36.7 C) (Oral)  Resp 16  SpO2 99%  Physical Exam  Nursing note and vitals reviewed. Constitutional: She is oriented to person, place, and time. She appears well-developed and well-nourished.  HENT:  Head: Normocephalic.  Eyes: EOM are normal.  Neck: Normal range of motion.  Pulmonary/Chest: Effort normal.  Abdominal: She exhibits no distension.  Musculoskeletal: Normal range of motion.       Right lower extremity with erythema warmth and swelling from her proximal right ankle to her proximal third anterior tibia.  She has pitting edema in this area.  She has no swelling proximal in her right leg or thigh.  Normal pulses in her  right foot.  Normal perfusion of her right foot.  Neurological: She is alert and oriented to person, place, and time.  Psychiatric: She has a normal mood and affect.    ED Course  Procedures (including critical care time)   Labs Reviewed  CBC  BASIC METABOLIC PANEL  CULTURE, BLOOD (ROUTINE X 2)  CULTURE, BLOOD (ROUTINE X 2)   No results found.    1.  Cellulitis right lower extremity   MDM  The patient has evidence of right lower extremity cellulitis.  She was started on antibiotics at this time.  She has no PCP and given her age I think she'll benefit from observational stay in the hospital for IV antibiotics and observation of her cellulitis.  Patient and family are agreeable to this.  Right lower extremity duplex without evidence of deep vein thromboses.        Lyanne Co, MD 02/26/12 1318

## 2012-02-26 NOTE — ED Notes (Signed)
Pt states she has been having swelling in her rt lower leg for 3-4 days, notes to be swollen and red in appearance

## 2012-02-26 NOTE — H&P (Addendum)
[facility] History and Physical  [name] WUJ:[id number] DOB: 06/28/1924 DOA: 02/26/2012  Referring physician:Dr campos PCP: does not have a PCP.  Chief Complaint: erythema and swelling of right leg x 3-4 days  HPI:  78 year old female with history of rectal cancer stage status post low anterior resection in 2008 (followed with Dr. Arline Asp and Dr. Jarold Motto) presented to the ED with symptoms of  Right lower leg erythema and swelling since past 3-4 days. Patient noticed erythema over the right leg for over a week but did not have any pain or swelling or difficulty ambulating until 4 days back when she noticed swelling with pain around the calf and lower leg with difficulty walking . She denies any fever, chills, trauma to the leg, any insect bite, or recent travel. She denies any chest pain, palpitations, shortness of breath, chills, abdominal pain, nausea or vomiting, bowel or urinary symptoms. In the ED she had Doppler of her lower extremity done which was negative for DVT , Baker's cyst or superficial thrombosis. Patient being admitted under hospitalist service for cellulitis of her right lower leg.  Review of Systems:  Constitutional: Denies fever, chills, diaphoresis, appetite change and fatigue.  HEENT: Denies photophobia, eye pain, redness, hearing loss, ear pain, congestion, sore throat, rhinorrhea, sneezing, mouth sores, trouble swallowing, neck pain, neck stiffness and tinnitus.   Respiratory: Denies SOB, DOE, cough, chest tightness,  and wheezing.   Cardiovascular: Denies chest pain, palpitations and leg swelling.  Gastrointestinal: Denies nausea, vomiting, abdominal pain, diarrhea, constipation, blood in stool and abdominal distention.  Genitourinary: Denies dysuria, urgency, frequency, hematuria, flank pain and difficulty urinating.  Musculoskeletal: pain and swelling over right lower leg. Denies myalgias, back pain, joint swelling, arthralgias. Has difficulty walking on  right leg. Skin: Denies pallor, rash and wound.  Neurological: Denies dizziness, seizures, syncope, weakness, light-headedness, numbness and headaches.  Hematological: Denies adenopathy. Easy bruising, personal or family bleeding history  Psychiatric/Behavioral: Denies suicidal ideation, mood changes, confusion, nervousness, sleep disturbance and agitation   Past Medical History  Diagnosis Date  . History of rectal cancer   . History of colon polyps   . Cancer     rectal ca no chemo/radiation  . Asthma    History reviewed. No pertinent past surgical history. Social History:  reports that she has never smoked. She has never used smokeless tobacco. She reports that she does not drink alcohol or use illicit drugs.  Family history: non pertinent  Allergies  Allergen Reactions  . Levofloxacin     REACTION: Got very hot and turned red all over    History reviewed. No pertinent family history.  Prior to Admission medications   Not on File    Physical Exam:  Filed Vitals:   02/26/12 1121 02/26/12 1422  BP: 204/77 159/69  Pulse: 84 71  Temp: 98.1 F (36.7 C) 97.9 F (36.6 C)  TempSrc: Oral Oral  Resp: 16 16  SpO2: 99% 98%    Constitutional: Vital signs reviewed.  Patient is a well-developed and well-nourished in no acute distress and cooperative with exam. Alert and oriented x3.  Head: Normocephalic and atraumatic Ear: TM normal bilaterally Mouth: no erythema or exudates, MMM Eyes: PERRL, EOMI, conjunctivae normal, No scleral icterus.  Neck: Supple, Trachea midline normal ROM, No JVD, mass, thyromegaly, or carotid bruit present.  Cardiovascular: RRR, S1 normal, S2 normal, no MRG, pulses symmetric and intact bilaterally Pulmonary/Chest: CTAB, no wheezes, rales, or rhonchi Abdominal: old laparotomy scar.  Soft. Non-tender, non-distended, bowel sounds are  normal, no masses, organomegaly, or guarding present.  GU: no CVA tenderness Musculoskeletal: erythema involving middle  third of  right tibia extending to the calf with swelling and tenderness to palpation. No joint deformities,  or stiffness, ROM full and no nontender Ext: no edema and no cyanosis, pulses palpable bilaterally (DP and PT) Hematology: no cervical, inginal, or axillary adenopathy.  Neurological: A&O x3, Strenght is normal and symmetric bilaterally, cranial nerve II-XII are grossly intact, no focal motor deficit, sensory intact to light touch bilaterally.  Skin: Warm, dry and intact. No rash, cyanosis, or clubbing.  Psychiatric: Normal mood and affect. speech and behavior is normal. Judgment and thought content normal. Cognition and memory are normal.   Labs on Admission:  Basic Metabolic Panel:  Lab 02/26/12 0454  NA 139  K 4.2  CL 103  CO2 29  GLUCOSE 93  BUN 17  CREATININE 0.71  CALCIUM 9.0  MG --  PHOS --   Liver Function Tests: No results found for this basename: AST:5,ALT:5,ALKPHOS:5,BILITOT:5,PROT:5,ALBUMIN:5 in the last 168 hours No results found for this basename: LIPASE:5,AMYLASE:5 in the last 168 hours No results found for this basename: AMMONIA:5 in the last 168 hours CBC:  Lab 02/26/12 1238  WBC 9.0  NEUTROABS --  HGB 12.9  HCT 39.4  MCV 93.6  PLT 247   Cardiac Enzymes: No results found for this basename: CKTOTAL:5,CKMB:5,CKMBINDEX:5,TROPONINI:5 in the last 168 hours BNP: No components found with this basename: POCBNP:5 CBG: No results found for this basename: GLUCAP:5 in the last 168 hours  Radiological Exams on Admission: No results found.   Assessment/Plan Principal Problem:  *Cellulitis of right leg Patient received a dose of IV vancomycin and rocephin in ED  -Place patient on IV clindamycin 600 mg every 8 hours -No signs of DVT on Doppler -Get PT eval -Monitor for progression of symptoms   Elevated BP Patient noted to be hypertensive in ED. No on any meds at home. Will place on prn hydralazine   Hx of rectal ca  s/p surgery in 08. Stable and  follows with Dr Jarold Motto as outpt  Remaining medical issues are stable patient not on any medications at home  Code Status: Full code  **Disposition Plan: Monitor overnight and can be discharged in 1 to 2 days on po abx  if symptoms improve   Eddie North [facility] Pager [phone]*  If 7PM-7AM, please contact night-coverage [email or web address] Password [email or web address] 02/26/2012, 3:11 PM

## 2012-02-26 NOTE — Progress Notes (Signed)
Right:  No evidence of DVT, superficial thrombosis, or Baker's cyst.  Left:  Negative for DVT in the common femoral vein.  

## 2012-02-27 DIAGNOSIS — L02419 Cutaneous abscess of limb, unspecified: Principal | ICD-10-CM

## 2012-02-27 DIAGNOSIS — L03119 Cellulitis of unspecified part of limb: Principal | ICD-10-CM

## 2012-02-27 DIAGNOSIS — I10 Essential (primary) hypertension: Secondary | ICD-10-CM

## 2012-02-27 MED ORDER — DOXYCYCLINE HYCLATE 100 MG PO TABS: 100.0000 mg | ORAL_TABLET | Freq: Two times a day (BID) | ORAL | Status: AC

## 2012-02-27 NOTE — Evaluation (Signed)
Physical Therapy Evaluation Patient Details Name: [name] MRN: [id number] DOB: 10-Dec-1923 Today's Date: 02/27/2012 Time: 1125-1140 PT Time Calculation (min): 15 min  PT Assessment / Plan / Recommendation Clinical Impression  78 yo admitted with cellulitis who has had reduction in edema in right leg since admission,but still with pain in right ankle.  She was very independent prior to admission and pt does not want to use a RW at home.  Anticipate she will improve in mobility as pain decreases and cellulitis resolves.  She does not need further PT or DME    PT Assessment  Patent does not need any further PT services    Follow Up Recommendations  No PT follow up    Barriers to Discharge        Equipment Recommendations  None recommended by PT    Recommendations for Other Services     Frequency      Precautions / Restrictions     Pertinent Vitals/Pain Pt c/o pain in right ankle that increased with activity      Mobility  Bed Mobility Bed Mobility: Rolling Right;Rolling Left Rolling Right: 7: Independent Rolling Left: 7: Independent Transfers Transfers: Sit to Stand;Stand to Sit Sit to Stand: 7: Independent Stand to Sit: 7: Independent Ambulation/Gait Ambulation/Gait Assistance: 6: Modified independent (Device/Increase time) Ambulation Distance (Feet): 20 Feet (10x2) Assistive device: Rolling walker;Other (Comment) (pushes IV pole) Ambulation/Gait Assistance Details: pt with some c/o pain in right ankle when walking, but she does not want to use RW to help take weight off it.  She did try it, however, but found that it did not help Gait Pattern: Antalgic;Decreased stance time - right General Gait Details: pt with pain with weight bearing on right leg Stairs: No (verbally reviewed sideways technique) Wheelchair Mobility Wheelchair Mobility: No    Exercises     PT Diagnosis:    PT Problem List:   PT Treatment Interventions:     PT Goals    Visit  Information  Last PT Received On: 02/27/12 Assistance Needed: +1    Subjective Data  Subjective: I don't want no confounded walker Patient Stated Goal: to go home ASAP   Prior Functioning  Home Living Lives With: Family Available Help at Discharge: Family Type of Home: House Home Layout: Two level Alternate Level Stairs-Number of Steps: flight Alternate Level Stairs-Rails: Right;Left Home Adaptive Equipment: Walker - rolling;Bedside commode/3-in-1 Additional Comments: pt has access to husband's medical equipment.  Husband currently in ICU Prior Function Level of Independence: Independent Able to Take Stairs?: Yes Communication Communication: No difficulties    Cognition  Overall Cognitive Status: Appears within functional limits for tasks assessed/performed Arousal/Alertness: Awake/alert Orientation Level: Appears intact for tasks assessed Behavior During Session: [facility] for tasks performed    Extremity/Trunk Assessment Right Lower Extremity Assessment RLE ROM/Strength/Tone: Deficits RLE ROM/Strength/Tone Deficits: pt with some trophic changes in  skin around right ankle. No real edema perceived, but pt does have pain in palpation. Left Lower Extremity Assessment LLE ROM/Strength/Tone: Within functional levels   Balance Balance Balance Assessed: No  End of Session PT - End of Session Activity Tolerance: Patient limited by pain Patient left: in bed;with family/visitor present  GP     Donnetta Hail 02/27/2012, 11:54 AM

## 2012-02-27 NOTE — Progress Notes (Signed)
Pt. Discharged to daughters. IV discontinued. Discharge instructions explained to daughter and pt. Prescriptions called in to [facility] on [address]. Pt's husband being taken off life support in the ICU, she and other family members going to ICU after being discharged.

## 2012-02-27 NOTE — Progress Notes (Signed)
   CARE MANAGEMENT NOTE 02/27/2012  Patient:  SOMYA, JAUREGUI   Account Number:  [id number]  Date Initiated:  02/27/2012  Documentation initiated by:  PEARSON,COOKIE  Subjective/Objective Assessment:   pt admitted with cco right leg erythema and swelling for 3-4 days.     Action/Plan:   from home   Anticipated DC Date:  02/28/2012   Anticipated DC Plan:  HOME/SELF CARE      DC Planning Services  CM consult      Choice offered to / List presented to:             Status of service:  In process, will continue to follow Medicare Important Message given?  NA - LOS <3 / Initial given by admissions (If response is "NO", the following Medicare IM given date fields will be blank) Date Medicare IM given:   Date Additional Medicare IM given:    Discharge Disposition:    Per UR Regulation:  Reviewed for med. necessity/level of care/duration of stay  If discussed at Long Length of Stay Meetings, dates discussed:    Comments:  02/27/12 MPearson, RN, BSN Chart reviewed. Pt plan to dc home at discharge. Will continue to follow for any home health needs.

## 2012-02-27 NOTE — Discharge Summary (Addendum)
Physician Discharge Summary  Eniyah Eastmond ZOX:[id number] DOB: 1924-03-04 DOA: 02/26/2012  PCP: No primary provider on file.  Admit date: 02/26/2012 Discharge date: 02/27/2012  Recommendations for Outpatient Follow-up:  1. Discharge home 2. Needs to have her BP checked preferably at an urgent care in 1 week. She is planning to get a PCP in the community.  Discharge Diagnoses:  Principal Problem:  *Cellulitis of right lowe leg    Discharge Condition: fair  Diet recommendation: low sodium  Filed Weights   02/26/12 1527  Weight: 53.7 kg (118 lb 6.2 oz)    History of present illness:  78 year old female with history of rectal cancer stage status post low anterior resection in 2008 (followed with Dr. Arline Asp and Dr. Jarold Motto) presented to the ED with symptoms of Right lower leg erythema and swelling since past 3-4 days. Patient noticed erythema over the right leg for over a week but did not have any pain or swelling or difficulty ambulating until 4 days back when she noticed swelling with pain around the calf and lower leg with difficulty walking . She denies any fever, chills, trauma to the leg, any insect bite, or recent travel. She denies any chest pain, palpitations, shortness of breath, chills, abdominal pain, nausea or vomiting, bowel or urinary symptoms.  In the ED she had Doppler of her lower extremity done which was negative for DVT , Baker's cyst or superficial thrombosis. Patient being admitted under hospitalist service for cellulitis of her right lower leg.      Hospital Course:  Cellulitis of right leg  Patient received a dose of IV vancomycin and rocephin in ED  -Place patient on IV clindamycin 600 mg every 8 hours  -No signs of DVT on Doppler USG -her cellulitis has markedly resolved. i will discharge her on a course of po doxycycline to complete a  10 day course -Get PT eval recommends stable for discharge home   Elevated BP  Patient noted to be hypertensive in  ED. No on any meds at home. Possibly related to acute infection. Should follow up her BP as outpatient. does not have a PCP and is planning to establish care in the community.   Hx of rectal ca  s/p surgery in 08. Stable and follows with Dr Jarold Motto as outpt   Procedures:  none  Consultations:  none  Discharge Exam: Filed Vitals:   02/27/12 0649  BP: 174/76  Pulse: 76  Temp: 98.2 F (36.8 C)  Resp: 20   Filed Vitals:   02/26/12 1527 02/26/12 2015 02/26/12 2237 02/27/12 0649  BP: 191/71 143/62 147/68 174/76  Pulse: 73  85 76  Temp: 97.6 F (36.4 C)  98.4 F (36.9 C) 98.2 F (36.8 C)  TempSrc: Oral  Oral Oral  Resp: 18  20 20   Height: 5\' 4"  (1.626 m)     Weight: 53.7 kg (118 lb 6.2 oz)     SpO2: 100%  96% 98%    General: elderly female in NAD Cardiovascular: N S1 1 &s2,no murmurs Respiratory: clear b./l , no added sounds Abd: soft, NT, ND, BS+ Ext: warm, no edema,RLE erythema and swelling markedly improved CNS: AAOX3   Discharge Instructions   Medication List  As of 02/27/2012 12:07 PM   TAKE these medications         doxycycline 100 MG tablet   Commonly known as: VIBRA-TABS   Take 1 tablet (100 mg total) by mouth 2 (two) times daily. FOR 9 DAYS  The results of significant diagnostics from this hospitalization (including imaging, microbiology, ancillary and laboratory) are listed below for reference.    Significant Diagnostic Studies: No results found.  Microbiology: No results found for this or any previous visit (from the past 240 hour(s)).   Labs: Basic Metabolic Panel:  Lab 02/26/12 9604  NA 139  K 4.2  CL 103  CO2 29  GLUCOSE 93  BUN 17  CREATININE 0.71  CALCIUM 9.0  MG --  PHOS --   Liver Function Tests: No results found for this basename: AST:5,ALT:5,ALKPHOS:5,BILITOT:5,PROT:5,ALBUMIN:5 in the last 168 hours No results found for this basename: LIPASE:5,AMYLASE:5 in the last 168 hours No results found for this  basename: AMMONIA:5 in the last 168 hours CBC:  Lab 02/26/12 1238  WBC 9.0  NEUTROABS --  HGB 12.9  HCT 39.4  MCV 93.6  PLT 247   Cardiac Enzymes: No results found for this basename: CKTOTAL:5,CKMB:5,CKMBINDEX:5,TROPONINI:5 in the last 168 hours BNP: BNP (last 3 results) No results found for this basename: PROBNP:3 in the last 8760 hours CBG: No results found for this basename: GLUCAP:5 in the last 168 hours  Time coordinating discharge: 40 minutes  Signed:  Eddie North  [facility] 02/27/2012, 12:07 PM

## 2012-03-04 LAB — CULTURE, BLOOD (ROUTINE X 2): Culture: NO GROWTH

## 2012-03-25 ENCOUNTER — Ambulatory Visit (INDEPENDENT_AMBULATORY_CARE_PROVIDER_SITE_OTHER): Payer: Medicare Other | Admitting: Family Medicine

## 2012-03-25 ENCOUNTER — Encounter: Payer: Self-pay | Admitting: Family Medicine

## 2012-03-25 VITALS — BP 162/76 | HR 104 | Temp 98.1°F | Ht 63.0 in | Wt 116.0 lb

## 2012-03-25 DIAGNOSIS — I10 Essential (primary) hypertension: Secondary | ICD-10-CM

## 2012-03-25 DIAGNOSIS — L0291 Cutaneous abscess, unspecified: Secondary | ICD-10-CM

## 2012-03-25 DIAGNOSIS — L0390 Cellulitis, unspecified: Secondary | ICD-10-CM

## 2012-03-25 LAB — CBC WITH DIFFERENTIAL/PLATELET
Basophils Absolute: 0 10*3/uL (ref 0.0–0.1)
Basophils Relative: 0.5 % (ref 0.0–3.0)
Eosinophils Absolute: 0.1 10*3/uL (ref 0.0–0.7)
Hemoglobin: 12.7 g/dL (ref 12.0–15.0)
Lymphocytes Relative: 10.7 % — ABNORMAL LOW (ref 12.0–46.0)
MCHC: 32.1 g/dL (ref 30.0–36.0)
MCV: 94 fl (ref 78.0–100.0)
Monocytes Absolute: 0.6 10*3/uL (ref 0.1–1.0)
Neutro Abs: 6.6 10*3/uL (ref 1.4–7.7)
Neutrophils Relative %: 80.1 % — ABNORMAL HIGH (ref 43.0–77.0)
RDW: 14.9 % — ABNORMAL HIGH (ref 11.5–14.6)
WBC: 8.3 10*3/uL (ref 4.5–10.5)

## 2012-03-25 LAB — BASIC METABOLIC PANEL
BUN: 20 mg/dL (ref 6–23)
Chloride: 97 mEq/L (ref 96–112)
Creatinine, Ser: 0.9 mg/dL (ref 0.4–1.2)
GFR: 67.39 mL/min (ref >60.00)
Potassium: 5.3 mEq/L — ABNORMAL HIGH (ref 3.5–5.1)

## 2012-03-25 MED ORDER — DOXYCYCLINE HYCLATE 100 MG PO TABS: 100.0000 mg | ORAL_TABLET | Freq: Two times a day (BID) | ORAL | Status: DC

## 2012-03-25 MED ORDER — SULFAMETHOXAZOLE-TRIMETHOPRIM 800-160 MG PO TABS: 1.0000 | ORAL_TABLET | Freq: Two times a day (BID) | ORAL | Status: DC

## 2012-03-25 NOTE — Progress Notes (Addendum)
Chief Complaint  Patient presents with  . Establish Care    HPI:  [name] is here to establish care and to address her blood pressure. Per review of chart she was hospitalized in March 24, 2012 for treatment of cellulitis and had elevated blood pressures during her stay. The discharge document states it was recommended that she establish care with a PCP for management of her elevated blood pressure and follow up for her cellulitis.  She reports she had difficulty finding our office and is flustered because she was late.  She has a history of rectal cancer and colon polyps followed by Dr. Jarold Motto, but has been doing well in this regard for many years.  She reports she has not had any major chronic problems otherwise and is here to address the hypertension. She reports that remotely she was treated with prednisone for bilateral leg stiffness. This resolved years ago and per her report she was told she did not need to take prednisone again.   Social: She now finally has time to take care of herself as her husband recently passed away in 24-Mar-2012  Medications, PMH, PSH, SH, FH and allergies were reviewed. Vaccines Reviewed and will address at next appointment.  HTN: -no history of treatment -did have elevated BP during hospital stay recently -pt thinks this may be related to stress from recent passing of husband -now lives with daughter -denies CP, SOB, palpitations, vision changes, HA  Leg Swelling: -seen in ED in Sep 16, 2013for this and treated with IV vanc and then oral abx per patient and review of chart -per discharge summary [place] for DVT negative at the time -patient reports R lower leg has looked the same since discharge with swelling, erythema and pain -she reports the swelling improves when she elevates her legs at night -denies fever, chills, nausea, anorexia, numbness in her R leg or foot, weakness, malaise or SOB     Past Medical History  Diagnosis Date  . History of rectal  cancer   . History of colon polyps   . Cancer     rectal ca no chemo/radiation  . Asthma   . Blood in stool   . Chicken pox     Family History  Problem Relation Age of Onset  . Cancer Other     colon  . Heart disease Other     History   Social History  . Marital Status: Married    Spouse Name: N/A    Number of Children: N/A  . Years of Education: N/A   Social History Main Topics  . Smoking status: Never Smoker   . Smokeless tobacco: Never Used  . Alcohol Use: No  . Drug Use: No  . Sexually Active: No   Other Topics Concern  . None   Social History Narrative  . None    Current outpatient prescriptions:doxycycline (VIBRA-TABS) 100 MG tablet, Take 1 tablet (100 mg total) by mouth 2 (two) times daily., Disp: 20 tablet, Rfl: 0;  LOTEMAX 0.5 % GEL, Place 1 drop into both eyes daily. , Disp: , Rfl: ;  sulfamethoxazole-trimethoprim (BACTRIM DS) 800-160 MG per tablet, Take 1 tablet by mouth 2 (two) times daily., Disp: 20 tablet, Rfl: 0  EXAM:  Filed Vitals:   03/25/12 1119  BP: 162/76  Pulse: 104  Temp: 98.1 F (36.7 C)  Pulse normal on recheck: 84  GENERAL: vitals reviewed and listed below, alert, oriented, appears well hydrated and in no acute distress though is slightly embarrassed and  flustered about being late - otherwise appears very comfortable  HEENT: atraumatic, conjucntiva clear, no obvious abnormalities on inspection  LUNGS: clear to auscultation bilaterally, no wheezes, rales or rhonchi, good air movement  CV: HRRR, no peripheral edema  MS: moves all extremities: R lower leg mild-mod  Erythema, rubor, pitting edema/swelling and tenderness to palpation to upper calf, normal pedal pulses, normal movement of lower leg and foot  PSYCH: pleasant and cooperative, no obvious depression or anxiety  ASSESSMENT AND PLAN:  Discussed the following assessment and plan:  1. Cellulitis  Lower Extremity Venous Reflux Right, CBC with Differential  2. Hypertension   Basic metabolic panel    Orders Placed This Encounter  Procedures  . HM MAMMOGRAPHY    This external order was created through the Results Console.  Marland Kitchen HM PAP SMEAR    This external order was created through the Results Console.  Marland Kitchen CBC with Differential  . Basic metabolic panel  . Lower Extremity Venous Reflux Right    Standing Status: Future     Number of Occurrences:      Standing Expiration Date: 03/25/2013    R/o DVT - RLE swelling and erythema for 1 month    Order Specific Question:  Laterality    Answer:  Right    Order Specific Question:  Where should this test be performed:    Answer:  [facility]- [address]   Cellulitis: likley, Patient appears to have a persistent cellulitis vs erysiphelas in the R lower extremity. Currently no symptoms to suggest systemic infection, but will obtain a repeat LE [place] and labs to rule out new DVT and systemic infection. Will double cover for MRSA and strep given persistent infection after treatment. Serious and and common potential adverse effects discussed. Patient to follow up in 48 hours or sooner if worsening or new symptoms.  HTN: will order renal function and electrolytes today and add BP medication at follow up appointment pending results.   Patient Instructions  -we ordered labs and will contact you if these are abnormal  -please take your antibiotics as instructed and contact us if you have any problems with these medications  -please get the ultrasound done as scheduled  -follow up with me on Thursday or Friday of this week  Cellulitis Cellulitis is an infection of the skin and the tissue beneath it. The area is typically red and tender. It is caused by germs (bacteria) (usually staph or strep) that enter the body through cuts or sores. Cellulitis most commonly occurs in the arms or lower legs.  HOME CARE INSTRUCTIONS   If you are given a prescription for medications which kill germs (antibiotics), take as directed until  finished.   If the infection is on the arm or leg, keep the limb elevated as able.   Use a warm cloth several times per day to relieve pain and encourage healing.   See your caregiver for recheck of the infected site as directed if problems arise.   Only take over-the-counter or prescription medicines for pain, discomfort, or fever as directed by your caregiver.  SEEK MEDICAL CARE IF:   The area of redness (inflammation) is spreading, there are red streaks coming from the infected site, or if a part of the infection begins to turn dark in color.   The joint or bone underneath the infected skin becomes painful after the skin has healed.   The infection returns in the same or another area after it seems to have gone  away.   A boil or bump swells up. This may be an abscess.   New, unexplained problems such as pain or fever develop.  SEEK IMMEDIATE MEDICAL CARE IF:   You have a fever.   You or your child feels drowsy or lethargic.   There is vomiting, diarrhea, or lasting discomfort or feeling ill (malaise) with muscle aches and pains.  MAKE SURE YOU:   Understand these instructions.   Will watch your condition.   Will get help right away if you are not doing well or get worse.  Document Released: 04/04/2005 Document Revised: 06/14/2011 Document Reviewed: 02/11/2008 [facility] Patient Information 2012 [place], [place].   Thank you for enrolling in MyChart. Please follow the instructions below to securely access your online medical record. MyChart allows you to send messages to your doctor, view your test results, renew your prescriptions, schedule appointments, and more.  How Do I Sign Up? 1. In your Internet browser, go to [email or web address] 2. Click on the New  User? link in the Sign In box.  3. Enter your MyChart Access Code exactly as it appears below. You will not need to use this code after you have completed the sign-up process. If you do not sign up  before the expiration date, you must request a new code. MyChart Access Code: [email or web address] Expires: 04/24/2012 12:07 PM  4. Enter the last four digits of your Social Security Number (xxxx) and Date of Birth (mm/dd/yyyy) as indicated and click Next. You will be taken to the next sign-up page. 5. Create a MyChart ID. This will be your MyChart login ID and cannot be changed, so think of one that is secure and easy to remember. 6. Create a MyChart password. You can change your password at any time. 7. Enter your Password Reset Question and Answer and click Next. This can be used at a later time if you forget your password.  8. Select your communication preference, and if applicable enter your e-mail address. You will receive e-mail notification when new information is available in MyChart by choosing to receive e-mail notifications and filling in your e-mail. 9. Click Sign In. You can now view your medical record.   Additional Information If you have questions, you can email [email or web address] or call [phone] to talk to our MyChart staff. Remember, MyChart is NOT to be used for urgent needs. For medical emergencies, dial 911.      Return to clinic immediately if symptoms worsen or persist or new concerns.  Return in about 2 days (around 03/27/2012).  Kriste Basque R.

## 2012-03-25 NOTE — Patient Instructions (Addendum)
-we ordered labs and will contact you if these are abnormal  -please take your antibiotics as instructed and contact us if you have any problems with these medications  -please get the ultrasound done as scheduled  -follow up with me on Thursday or Friday of this week  Cellulitis Cellulitis is an infection of the skin and the tissue beneath it. The area is typically red and tender. It is caused by germs (bacteria) (usually staph or strep) that enter the body through cuts or sores. Cellulitis most commonly occurs in the arms or lower legs.  HOME CARE INSTRUCTIONS   If you are given a prescription for medications which kill germs (antibiotics), take as directed until finished.   If the infection is on the arm or leg, keep the limb elevated as able.   Use a warm cloth several times per day to relieve pain and encourage healing.   See your caregiver for recheck of the infected site as directed if problems arise.   Only take over-the-counter or prescription medicines for pain, discomfort, or fever as directed by your caregiver.  SEEK MEDICAL CARE IF:   The area of redness (inflammation) is spreading, there are red streaks coming from the infected site, or if a part of the infection begins to turn dark in color.   The joint or bone underneath the infected skin becomes painful after the skin has healed.   The infection returns in the same or another area after it seems to have gone away.   A boil or bump swells up. This may be an abscess.   New, unexplained problems such as pain or fever develop.  SEEK IMMEDIATE MEDICAL CARE IF:   You have a fever.   You or your child feels drowsy or lethargic.   There is vomiting, diarrhea, or lasting discomfort or feeling ill (malaise) with muscle aches and pains.  MAKE SURE YOU:   Understand these instructions.   Will watch your condition.   Will get help right away if you are not doing well or get worse.  Document Released: 04/04/2005  Document Revised: 06/14/2011 Document Reviewed: 02/11/2008 [facility] Patient Information 2012 [place], [place].   Thank you for enrolling in MyChart. Please follow the instructions below to securely access your online medical record. MyChart allows you to send messages to your doctor, view your test results, renew your prescriptions, schedule appointments, and more.  How Do I Sign Up? 1. In your Internet browser, go to [email or web address] 2. Click on the New  User? link in the Sign In box.  3. Enter your MyChart Access Code exactly as it appears below. You will not need to use this code after you have completed the sign-up process. If you do not sign up before the expiration date, you must request a new code. MyChart Access Code: [email or web address] Expires: 04/24/2012 12:07 PM  4. Enter the last four digits of your Social Security Number (xxxx) and Date of Birth (mm/dd/yyyy) as indicated and click Next. You will be taken to the next sign-up page. 5. Create a MyChart ID. This will be your MyChart login ID and cannot be changed, so think of one that is secure and easy to remember. 6. Create a MyChart password. You can change your password at any time. 7. Enter your Password Reset Question and Answer and click Next. This can be used at a later time if you forget your password.  8. Select your communication preference, and if applicable enter your e-mail  address. You will receive e-mail notification when new information is available in MyChart by choosing to receive e-mail notifications and filling in your e-mail. 9. Click Sign In. You can now view your medical record.   Additional Information If you have questions, you can email [email or web address] or call [phone] to talk to our MyChart staff. Remember, MyChart is NOT to be used for urgent needs. For medical emergencies, dial 911.

## 2012-03-26 ENCOUNTER — Encounter (INDEPENDENT_AMBULATORY_CARE_PROVIDER_SITE_OTHER): Payer: No Typology Code available for payment source

## 2012-03-26 DIAGNOSIS — M7989 Other specified soft tissue disorders: Secondary | ICD-10-CM

## 2012-03-26 DIAGNOSIS — L0390 Cellulitis, unspecified: Secondary | ICD-10-CM

## 2012-03-26 DIAGNOSIS — M79609 Pain in unspecified limb: Secondary | ICD-10-CM

## 2012-03-27 ENCOUNTER — Ambulatory Visit (INDEPENDENT_AMBULATORY_CARE_PROVIDER_SITE_OTHER): Payer: No Typology Code available for payment source | Admitting: Family Medicine

## 2012-03-27 ENCOUNTER — Encounter: Payer: Self-pay | Admitting: Family Medicine

## 2012-03-27 VITALS — BP 140/70 | Temp 98.2°F | Wt 118.0 lb

## 2012-03-27 DIAGNOSIS — L0390 Cellulitis, unspecified: Secondary | ICD-10-CM

## 2012-03-27 NOTE — Patient Instructions (Addendum)
-  take your antibiotics every day as directed  -elevated feet 3-4 times per day for 20-30 minutes  -See a doctor immediately if fevers, chills, feeling bad or redness or swelling worsening or spreading   -follow up in 3 days

## 2012-03-27 NOTE — Progress Notes (Addendum)
Chief Complaint  Patient presents with  . Follow-up    HPI:  Cellulitis: -R lower ext -warmth, redness, swelling - unchanged -unfortunately she just started the abx today -denies: fevers, chills, malaise, palpations, CP  Hyperkalemia: -very mild on labs -has had a little GI bug that went thru the family recenlty -eats lots of bananas and K in OJ -denies: palpations   ROS: See pertinent positives and negatives per HPI.  Past Medical History  Diagnosis Date  . History of rectal cancer   . History of colon polyps   . Cancer     rectal ca no chemo/radiation  . Asthma   . Blood in stool   . Chicken pox     Family History  Problem Relation Age of Onset  . Cancer Other     colon  . Heart disease Other     History   Social History  . Marital Status: Married    Spouse Name: N/A    Number of Children: N/A  . Years of Education: N/A   Social History Main Topics  . Smoking status: Never Smoker   . Smokeless tobacco: Never Used  . Alcohol Use: No  . Drug Use: No  . Sexually Active: No   Other Topics Concern  . None   Social History Narrative  . None    Current outpatient prescriptions:doxycycline (VIBRA-TABS) 100 MG tablet, Take 1 tablet (100 mg total) by mouth 2 (two) times daily., Disp: 20 tablet, Rfl: 0;  LOTEMAX 0.5 % GEL, Place 1 drop into both eyes daily. , Disp: , Rfl: ;  sulfamethoxazole-trimethoprim (BACTRIM DS) 800-160 MG per tablet, Take 1 tablet by mouth 2 (two) times daily., Disp: 20 tablet, Rfl: 0  EXAM:  Filed Vitals:   03/27/12 1401  BP: 140/70  Temp: 98.2 F (36.8 C)    There is no height on file to calculate BMI.  GENERAL: vitals reviewed and listed below, alert, oriented, appears well hydrated and in no acute distress  HEENT: atraumatic, conjucntiva clear, no obvious abnormalities on inspection of external nose and ears  NECK: no masses on inspection  MS: moves all extremities without noticeable abnormality, rubor, erthyema and  swelling of R LE unchanged from previous exam, good DP pulses  PSYCH: pleasant and cooperative, no obvious depression or anxiety  ASSESSMENT AND PLAN:  Discussed the following assessment and plan:  1. Cellulitis    LE venous duplex results reviewed and normal with no evidence of DVT. Labs reviewed and ok with a mild hypokal - pt recovering from diarrheal illness last visit - no doing better. Advised to drink plenty of fluids and decrease fortified OJ and will recheck on Monday. Cellulitis unchanges, but pt just started abx today. Stressed importance of aggressive management - pt repeated back how to take her abx and is to elevate and use compresses. Advised daughter and pt  of seriousness of infection and that in someone this age this can become a serious systemic infection with the risk of multiple complications including death. I also discussed the option of hospital management, but patient was adamantly against this and patient and daughter agreed to follow treatment recs closely and go to the ED if any worsening. Close follow up and strict ED precautions provided to patient and daughter who was present for this visit.  No orders of the defined types were placed in this encounter.    Patient Instructions  -take your antibiotics every day as directed  -elevated feet 3-4 times per  day for 20-30 minutes  -See a doctor immediately if fevers, chills, feeling bad or redness or swelling worsening or spreading   -follow up in 3 days     Return to clinic immediately if symptoms worsen or persist or new concerns.  Return in about 4 days (around 03/31/2012) for cellulitis.  Kriste Basque R.

## 2012-03-29 ENCOUNTER — Telehealth: Payer: Self-pay | Admitting: Family Medicine

## 2012-03-29 NOTE — Telephone Encounter (Signed)
Call to check on pt. Left message to see a doctor if worsening or feels unwell.

## 2012-03-31 ENCOUNTER — Encounter: Payer: Self-pay | Admitting: Family Medicine

## 2012-03-31 ENCOUNTER — Ambulatory Visit (INDEPENDENT_AMBULATORY_CARE_PROVIDER_SITE_OTHER): Payer: No Typology Code available for payment source | Admitting: Family Medicine

## 2012-03-31 VITALS — BP 124/70 | Temp 98.1°F | Wt 116.0 lb

## 2012-03-31 DIAGNOSIS — L0291 Cutaneous abscess, unspecified: Secondary | ICD-10-CM

## 2012-03-31 DIAGNOSIS — L0390 Cellulitis, unspecified: Secondary | ICD-10-CM

## 2012-03-31 MED ORDER — SULFAMETHOXAZOLE-TRIMETHOPRIM 800-160 MG PO TABS: 1.0000 | ORAL_TABLET | Freq: Two times a day (BID) | ORAL | Status: DC

## 2012-03-31 MED ORDER — DOXYCYCLINE HYCLATE 100 MG PO TABS: 100.0000 mg | ORAL_TABLET | Freq: Two times a day (BID) | ORAL | Status: DC

## 2012-03-31 NOTE — Progress Notes (Signed)
Chief Complaint  Patient presents with  . 4 day check up    HPI:  ROS: See pertinent positives and negatives per HPI.  Cellulitis: -improving a little -has been taking abx every day -did warm compresses a few time first day -elevating leg frequently -denies: fevers, malaise, chills, nausea   Past Medical History  Diagnosis Date  . History of rectal cancer   . History of colon polyps   . Cancer     rectal ca no chemo/radiation  . Asthma   . Blood in stool   . Chicken pox     Family History  Problem Relation Age of Onset  . Cancer Other     colon  . Heart disease Other     History   Social History  . Marital Status: Married    Spouse Name: N/A    Number of Children: N/A  . Years of Education: N/A   Social History Main Topics  . Smoking status: Never Smoker   . Smokeless tobacco: Never Used  . Alcohol Use: No  . Drug Use: No  . Sexually Active: No   Other Topics Concern  . None   Social History Narrative  . None    Current outpatient prescriptions:doxycycline (VIBRA-TABS) 100 MG tablet, Take 1 tablet (100 mg total) by mouth 2 (two) times daily., Disp: 20 tablet, Rfl: 0;  LOTEMAX 0.5 % GEL, Place 1 drop into both eyes daily. , Disp: , Rfl: ;  sulfamethoxazole-trimethoprim (BACTRIM DS) 800-160 MG per tablet, Take 1 tablet by mouth 2 (two) times daily., Disp: 20 tablet, Rfl: 0  EXAM:  Filed Vitals:   03/31/12 1556  BP: 124/70  Temp: 98.1 F (36.7 C)    There is no height on file to calculate BMI.  GENERAL: vitals reviewed and listed below, alert, oriented, appears well hydrated and in no acute distress  Skin: improving appearance of cellulitis (diminished erythema and swelling)  MS: moves all extremities without noticeable abnormality  PSYCH: pleasant and cooperative, no obvious depression or anxiety  ASSESSMENT AND PLAN:  Discussed the following assessment and plan:  1. Cellulitis  doxycycline (VIBRA-TABS) 100 MG tablet,  sulfamethoxazole-trimethoprim (BACTRIM DS) 800-160 MG per tablet   Cellulitis improving. Advised continued tx with close follow up and strict return precautions.  Patient Instructions  - continue anitbiotic every day until next appointment - refill sent to your pharmacy  -elevate leg for 30 minutes at least 3-4 times per day  -warm compresses 3-4 times per day  -follow up next week (7-10 days)    Return to clinic immediately if symptoms worsen or persist or new concerns.  Return in about 8 weeks (around 05/26/2012) for Cellulitis.  Kriste Basque R.

## 2012-03-31 NOTE — Patient Instructions (Signed)
-   continue anitbiotic every day until next appointment - refill sent to your pharmacy  -elevate leg for 30 minutes at least 3-4 times per day  -warm compresses 3-4 times per day  -follow up next week (7-10 days)

## 2012-04-04 ENCOUNTER — Telehealth: Payer: Self-pay | Admitting: Family Medicine

## 2012-04-04 NOTE — Telephone Encounter (Signed)
Caller: [name]/Patient; Patient Name: [name]; PCP: Kriste Basque [facility]); Best Callback Phone Number: [phone] Caller seen in office and diagnosed with cellulitis  on 03/31/12. Started Doxycline 100mg  bid  and Generic Bactrim DS- 800-160mg   bid. Caller now reporting Nausea x 04/02/12. States she can no longer take medicine due to nausea. Denies vomiting or fever. Reports Cellulitis area to leg seems to be iimproving.  Hiccuping " all the time" x 04/03/12.  Decreased appetite. All emergent symptoms ruled out per Nausea guideline with exception "  unable to take or keep down prescription medication ". Dr. Milinda Antis contacted via cell at approximately 17:32pm. MD recommended triage nurse call in Phenergan 25mg  tabs. , one tab three times a day as needed, #15 tabs with no refill and have patient take before taking cuirrent med treatment and then try taking current meds with food.,and call back with any problems. Call returned to patient and advised Dr.Tower would like for her to continue the current meds she is on since her cellulitis is improving with treatment. Discussed with patient that Dr. Milinda Antis requested nurse call patient in a script for Phenergan to take before taking meds. to prevent the nausea. Also, discussed with patient that she should take antibiotics wth food after taking Phenergan. Suggested patient not take both antibiotics together and try to space out with an hour between if possible. Advsied patient that traige nurses are available all night and all weekend covering the clinic  if she has any concerns, and all she has to do is dial the clinic phone number to reach one. Caller verbalized understanding and agreement. Script for Phenergan 25mg  as ordered per Dr. Milinda Antis called into  [facility] - [phone].

## 2012-04-07 NOTE — Telephone Encounter (Signed)
Called and spoke with pt and pt states that she is feeling better.  Pt states the phenergan helped a lot.  Pt states she is still taking the antibotics.  Advised pt to call office if symptoms come back or worsen.

## 2012-04-09 ENCOUNTER — Ambulatory Visit (INDEPENDENT_AMBULATORY_CARE_PROVIDER_SITE_OTHER): Payer: No Typology Code available for payment source | Admitting: Family Medicine

## 2012-04-09 ENCOUNTER — Encounter: Payer: Self-pay | Admitting: Family Medicine

## 2012-04-09 VITALS — BP 130/70 | Temp 97.7°F | Wt 114.0 lb

## 2012-04-09 DIAGNOSIS — L0390 Cellulitis, unspecified: Secondary | ICD-10-CM

## 2012-04-09 MED ORDER — DOXYCYCLINE HYCLATE 100 MG PO TABS: 100.0000 mg | ORAL_TABLET | Freq: Two times a day (BID) | ORAL | Status: DC

## 2012-04-09 NOTE — Patient Instructions (Addendum)
-  keep taking both antibiotics for two more days until October 4th (Friday) --> THEN stop taking the Bactrim and continue the doxycycline  -follow up with me next Wednesday or Thursday  -continue to elevate for 30 minutes at least 3 times per day with warm compress and wear compression stockings

## 2012-04-09 NOTE — Progress Notes (Addendum)
Chief Complaint  Patient presents with  . 7-10 day f/u    HPI:  F/u Cellulitis: -initially seen by me on 03/25/12 for persistent cellulitis after hospitalization for this with IV abx -improved on oral abx last visit - day 13 of abx -did have some nausea with abx but this resolved with phenergan rx by on call system -no diarrhea -continues to take doxy and bactrim -appetite good and drinking plenty of fluids -thinks leg is improving -no fevers, chills, malaise   ROS: See pertinent positives and negatives per HPI.  Past Medical History  Diagnosis Date  . History of rectal cancer   . History of colon polyps   . Cancer     rectal ca no chemo/radiation  . Asthma   . Blood in stool   . Chicken pox     Family History  Problem Relation Age of Onset  . Cancer Other     colon  . Heart disease Other     History   Social History  . Marital Status: Married    Spouse Name: N/A    Number of Children: N/A  . Years of Education: N/A   Social History Main Topics  . Smoking status: Never Smoker   . Smokeless tobacco: Never Used  . Alcohol Use: No  . Drug Use: No  . Sexually Active: No   Other Topics Concern  . None   Social History Narrative  . None    Current outpatient prescriptions:doxycycline (VIBRA-TABS) 100 MG tablet, Take 1 tablet (100 mg total) by mouth 2 (two) times daily., Disp: 14 tablet, Rfl: 0;  LOTEMAX 0.5 % GEL, Place 1 drop into both eyes daily. , Disp: , Rfl: ;  promethazine (PHENERGAN) 25 MG tablet, , Disp: , Rfl: ;  sulfamethoxazole-trimethoprim (BACTRIM DS) 800-160 MG per tablet, Take 1 tablet by mouth 2 (two) times daily., Disp: 20 tablet, Rfl: 0  EXAM:  Filed Vitals:   04/09/12 1024  BP: 130/70  Temp: 97.7 F (36.5 C)    There is no height on file to calculate BMI.  GENERAL: vitals reviewed and listed below, alert, oriented, appears well hydrated and in no acute distress  HEENT: atraumatic, conjucntiva clear, no obvious abnormalities on  inspection of external nose and ears  SKIN: improved appearance of cellulitis R LE some continued erythema and woody induration of distal LE  MS: moves all extremities without noticeable abnormality  PSYCH: pleasant and cooperative, no obvious depression or anxiety  ASSESSMENT AND PLAN:  Discussed the following assessment and plan:  1. Cellulitis  doxycycline (VIBRA-TABS) 100 MG tablet  -improving -discussed possibly stopping abx all together at next visit pending continued improvement   Patient Instructions  -keep taking both antibiotics for two more days until October 4th (Friday) --> THEN stop taking the Bactrim and continue the doxycycline  -follow up with me next Wednesday or Thursday  -continue to elevate for 30 minutes at least 3 times per day with warm compress and wear compression stockings      Return to clinic immediately if symptoms worsen or persist or new concerns.  Return in about 1 week (around 04/16/2012) for cellulitis.  Kriste Basque R.

## 2012-04-15 ENCOUNTER — Ambulatory Visit (INDEPENDENT_AMBULATORY_CARE_PROVIDER_SITE_OTHER): Payer: No Typology Code available for payment source | Admitting: Family Medicine

## 2012-04-15 ENCOUNTER — Telehealth: Payer: Self-pay | Admitting: Family Medicine

## 2012-04-15 ENCOUNTER — Encounter: Payer: Self-pay | Admitting: Family Medicine

## 2012-04-15 VITALS — BP 128/70 | Temp 97.6°F | Wt 118.0 lb

## 2012-04-15 DIAGNOSIS — I872 Venous insufficiency (chronic) (peripheral): Secondary | ICD-10-CM

## 2012-04-15 DIAGNOSIS — I8310 Varicose veins of unspecified lower extremity with inflammation: Secondary | ICD-10-CM

## 2012-04-15 DIAGNOSIS — L0390 Cellulitis, unspecified: Secondary | ICD-10-CM

## 2012-04-15 MED ORDER — TRIAMCINOLONE ACETONIDE 0.5 % EX CREA: TOPICAL_CREAM | Freq: Two times a day (BID) | CUTANEOUS | Status: DC

## 2012-04-15 NOTE — Patient Instructions (Addendum)
-  apply steroid cream 2x daily and moisturizer then elevate legs to let absorb and cover with an old knee high sock before putting on compression stocking  Good moisterizers:vasoline or eucerin or other think moisterizer that does not have any scent or color - hypoallergenic  -elevate legs at least 2x daily for 30- 90 minutes  -get the compression stockings today and use all the time  -please follow up with me in 2 weeks UNLESS it starts to get worse, more red - then let us know immediately and we may have you see a skin specialist

## 2012-04-15 NOTE — Telephone Encounter (Signed)
Pls advise.  

## 2012-04-15 NOTE — Progress Notes (Signed)
Chief Complaint  Patient presents with  . 1 week f/u    HPI:  Here for follow up for cellulitis: -initially tx in hospital with IV vanc, then PO abx, but presented to me with recurring cellulitis -she completed a 14 day course of doxy and bactrim since the last visit -she reports continued improvement with no chills, fevers or malaise -no pain anymore -improvement in swelling  ROS: See pertinent positives and negatives per HPI.  Past Medical History  Diagnosis Date  . History of rectal cancer   . History of colon polyps   . Cancer     rectal ca no chemo/radiation  . Asthma   . Blood in stool   . Chicken pox     Family History  Problem Relation Age of Onset  . Cancer Other     colon  . Heart disease Other     History   Social History  . Marital Status: Married    Spouse Name: N/A    Number of Children: N/A  . Years of Education: N/A   Social History Main Topics  . Smoking status: Never Smoker   . Smokeless tobacco: Never Used  . Alcohol Use: No  . Drug Use: No  . Sexually Active: No   Other Topics Concern  . None   Social History Narrative  . None    Current outpatient prescriptions:doxycycline (VIBRA-TABS) 100 MG tablet, Take 1 tablet (100 mg total) by mouth 2 (two) times daily., Disp: 14 tablet, Rfl: 0;  LOTEMAX 0.5 % GEL, Place 1 drop into both eyes daily. , Disp: , Rfl: ;  promethazine (PHENERGAN) 25 MG tablet, , Disp: , Rfl: ;  sulfamethoxazole-trimethoprim (BACTRIM DS) 800-160 MG per tablet, Take 1 tablet by mouth 2 (two) times daily., Disp: 20 tablet, Rfl: 0 triamcinolone cream (KENALOG) 0.5 %, Apply topically 2 (two) times daily. Apply to affected area of leg twice daily and cover with vasoline or eucerin, Disp: 30 g, Rfl: 0  EXAM:  Filed Vitals:   04/15/12 1345  BP: 128/70  Temp: 97.6 F (36.4 C)    There is no height on file to calculate BMI.  GENERAL: vitals reviewed and listed above, alert, oriented, appears well hydrated and in no  acute distress  HEENT: atraumatic, conjunttiva clear, no obvious abnormalities on inspection of external nose and ears  NECK: no obvious masses on inspection  LUNGS: clear to auscultation bilaterally, no wheezes, rales or rhonchi, good air movement  CV: HRRR, no peripheral edema  SKIN: continued improved appearance of R LE with only a small amount of edema (1+), mild erythema around lower leg, and mild induration of tissue. Tr edema contralateral leg as well.  MS: moves all extremities without noticeable abnormality  PSYCH: pleasant and cooperative, no obvious depression or anxiety  ASSESSMENT AND PLAN:  Discussed the following assessment and plan:  1. Cellulitis   2. Venous stasis dermatitis    -continued improvement and do think residual findings more resolving inflammatory findings following longstanding infection and venous stasis dermatitis. -will stop abx, provided rx for compression stockings to use all the time and will try a topical steroid (discussed risk/benefits) -follow up in 2 weeks or sooner if returning erythema or any other concerns  -Patient advised to return to notify a doctor immediately if symptoms worsen or persist or new concerns arise.  Patient Instructions  -apply steroid cream 2x daily and moisturizer then elevate legs to let absorb and cover with an old knee high sock  before puttine on compression stocking  Good moisterizers:vasoline or eucerin or other think moisterizer that does not have any scent or color - hypoallergenic  -elevate legs at least 2x daily for 30- 90 minutes  -get the compression stockings today and use all the time  -please follow up with me in 2 weeks UNLESS it start to get worse, more red - then let us know immediately and we may have you see a skin specialist     Tiny Chaudhary, Dahlia Client R.

## 2012-04-15 NOTE — Telephone Encounter (Signed)
Pt stated [facility] [address] does not have compression stocking. [facility]   will have to order stocking and it will take 10 business day. Please advise

## 2012-04-16 ENCOUNTER — Telehealth: Payer: Self-pay

## 2012-04-16 NOTE — Telephone Encounter (Signed)
Pt called and states that [facility] told pt she would have to schedule an appt and then the compression stockings would be ordered but it may take the same amount of time as [facility].  Pt would like to know if Dr. Selena Batten would like her to use the cream even though she does not have the stockings yet or does pt need to wait for the stockings.  Pt states she has to go to [facility] today and may see if they can put a rush on her compression stockings.  Pls advise.

## 2012-04-16 NOTE — Telephone Encounter (Signed)
Called pt to make aware that [facility] on [address] may have compression stockings as well.

## 2012-04-16 NOTE — Telephone Encounter (Signed)
Called and spoke with pt and pt is aware of [facility] information.  Pt given number to [facility] and advised to call office back if compression stockings are not available to give [place] a call back.

## 2012-04-16 NOTE — Telephone Encounter (Signed)
[name],  Can we find out what other places in [name] have compression stockings and send her there? They should be able to use the Rx I gave her. Don't want her to wait for 10 days.  [name]

## 2012-04-17 NOTE — Telephone Encounter (Signed)
Yes, can use cream and very important she elevated legs for 30-90 minutes 2 times daily. Thanks.

## 2012-04-17 NOTE — Telephone Encounter (Signed)
Called and spoke with pt and pt is awrae.

## 2012-04-29 ENCOUNTER — Encounter: Payer: Self-pay | Admitting: Family Medicine

## 2012-04-29 ENCOUNTER — Ambulatory Visit (INDEPENDENT_AMBULATORY_CARE_PROVIDER_SITE_OTHER): Payer: No Typology Code available for payment source | Admitting: Family Medicine

## 2012-04-29 VITALS — BP 120/70 | HR 93 | Temp 98.0°F | Wt 117.0 lb

## 2012-04-29 DIAGNOSIS — L0390 Cellulitis, unspecified: Secondary | ICD-10-CM

## 2012-04-29 DIAGNOSIS — Z23 Encounter for immunization: Secondary | ICD-10-CM

## 2012-04-29 DIAGNOSIS — L0291 Cutaneous abscess, unspecified: Secondary | ICD-10-CM

## 2012-04-29 NOTE — Patient Instructions (Signed)
-  wear compression stockings all day  -continue to use cream and elevate legs when you can

## 2012-04-29 NOTE — Progress Notes (Signed)
. Chief Complaint  Patient presents with  . 2 week follow up    cellulitis     HPI:  Here for follow up for cellulitis: -initially tx in hospital with IV vanc, then PO abx, but presented to me with recurring cellulitis -she completed a 14 day course of doxy and bactrim on 10/3, and was doing topical steroid and compression starting on 10/10 for residual woody edema  -she did have some trouble getting the compression stockings as multiple stores had them on back order and just started compression last week but only  wearing for 30 minutes per day -she reports continued improvement with no chills, fevers or malaise -no pain anymore -improvement in swelling  ROS: See pertinent positives and negatives per HPI.  Past Medical History  Diagnosis Date  . History of rectal cancer   . History of colon polyps   . Cancer     rectal ca no chemo/radiation  . Asthma   . Blood in stool   . Chicken pox     Family History  Problem Relation Age of Onset  . Cancer Other     colon  . Heart disease Other     History   Social History  . Marital Status: Married    Spouse Name: N/A    Number of Children: N/A  . Years of Education: N/A   Social History Main Topics  . Smoking status: Never Smoker   . Smokeless tobacco: Never Used  . Alcohol Use: No  . Drug Use: No  . Sexually Active: No   Other Topics Concern  . None   Social History Narrative  . None    Current outpatient prescriptions:triamcinolone cream (KENALOG) 0.5 %, Apply topically 2 (two) times daily. Apply to affected area of leg twice daily and cover with vasoline or eucerin, Disp: 30 g, Rfl: 0;  LOTEMAX 0.5 % GEL, Place 1 drop into both eyes daily. , Disp: , Rfl: ;  promethazine (PHENERGAN) 25 MG tablet, , Disp: , Rfl:   EXAM:  Filed Vitals:   04/29/12 1343  BP: 120/70  Pulse: 93  Temp: 98 F (36.7 C)    There is no height on file to calculate BMI.  GENERAL: vitals reviewed and listed above, alert, oriented,  appears well hydrated and in no acute distress  HEENT: atraumatic, conjunttiva clear, no obvious abnormalities on inspection of external nose and ears  NECK: no obvious masses on inspection  LUNGS: clear to auscultation bilaterally, no wheezes, rales or rhonchi, good air movement  CV: HRRR, no peripheral edema  SKIN: continued improved appearance of R LE with only a small amount of edema (1+), mild erythema around lower leg, and mild induration of tissue. Tr edema contralateral leg as well.  MS: moves all extremities without noticeable abnormality  PSYCH: pleasant and cooperative, no obvious depression or anxiety  ASSESSMENT AND PLAN:  Discussed the following assessment and plan:  1. Cellulitis    -continued improvement and do think residual findings more c/w venous stasis dermatitis. -continue compression throughout day as pt was only using for 30 minutes per day, elevation, topical steroid -flu today -follow up in 2 weeks or sooner if returning erythema or any other concerns  -Patient advised to return to notify a doctor immediately if symptoms worsen or persist or new concerns arise.  Patient Instructions  -wear compression stockings all day  -continue to use cream and elevate legs when you can     Kriste Basque R.

## 2012-05-13 ENCOUNTER — Ambulatory Visit (INDEPENDENT_AMBULATORY_CARE_PROVIDER_SITE_OTHER): Payer: No Typology Code available for payment source | Admitting: Family Medicine

## 2012-05-13 ENCOUNTER — Encounter: Payer: Self-pay | Admitting: Family Medicine

## 2012-05-13 VITALS — BP 130/78 | HR 82 | Temp 97.8°F | Wt 117.0 lb

## 2012-05-13 DIAGNOSIS — L0390 Cellulitis, unspecified: Secondary | ICD-10-CM

## 2012-05-13 LAB — CBC
RBC: 4.23 Mil/uL (ref 3.87–5.11)
RDW: 14.9 % — ABNORMAL HIGH (ref 11.5–14.6)
WBC: 6 10*3/uL (ref 4.5–10.5)

## 2012-05-13 LAB — C-REACTIVE PROTEIN: CRP: 0.7 mg/dL (ref 0.5–20.0)

## 2012-05-13 LAB — BASIC METABOLIC PANEL
CO2: 29 mEq/L (ref 19–32)
Chloride: 100 mEq/L (ref 96–112)
Potassium: 3.8 mEq/L (ref 3.5–5.1)

## 2012-05-13 NOTE — Progress Notes (Signed)
Chief Complaint  Patient presents with  . 2 week f/u    cellutlitis    HPI:  F/u LE swelling: -s/p treatment with resolution of signs/symptoms of infection -residual woody edema and venous stasis tx with compression and topical steroid -applies steroid cream daily and wearing stocking compression stockings most days  -denies: pain, fever, malaise  ROS: See pertinent positives and negatives per HPI.  Past Medical History  Diagnosis Date  . History of rectal cancer   . History of colon polyps   . Cancer     rectal ca no chemo/radiation  . Asthma   . Blood in stool   . Chicken pox     Family History  Problem Relation Age of Onset  . Cancer Other     colon  . Heart disease Other     History   Social History  . Marital Status: Married    Spouse Name: N/A    Number of Children: N/A  . Years of Education: N/A   Social History Main Topics  . Smoking status: Never Smoker   . Smokeless tobacco: Never Used  . Alcohol Use: No  . Drug Use: No  . Sexually Active: No   Other Topics Concern  . None   Social History Narrative  . None    Current outpatient prescriptions:LOTEMAX 0.5 % GEL, Place 1 drop into both eyes daily. , Disp: , Rfl: ;  promethazine (PHENERGAN) 25 MG tablet, , Disp: , Rfl: ;  triamcinolone cream (KENALOG) 0.5 %, Apply topically 2 (two) times daily. Apply to affected area of leg twice daily and cover with vasoline or eucerin, Disp: 30 g, Rfl: 0  EXAM:  Filed Vitals:   05/13/12 1347  BP: 130/78  Pulse: 82  Temp: 97.8 F (36.6 C)    There is no height on file to calculate BMI.  GENERAL: vitals reviewed and listed above, alert, oriented, appears well hydrated and in no acute distress  HEENT: atraumatic, conjunttiva clear, no obvious abnormalities on inspection of external nose and ears  NECK: no obvious masses on inspection  SKIN: similar appearance of RLE - woody edema with mild erythema and swelling   MS: moves all extremities without  noticeable abnormality  PSYCH: pleasant and cooperative, no obvious depression or anxiety  ASSESSMENT AND PLAN:  Discussed the following assessment and plan:  1. Cellulitis  MR Tibia Fibula Right W Wo Contrast, CBC (no diff), C-reactive Protein, Basic metabolic panel   -will get MRI to ensure no deep seated infection or osteo - plan pending these results -continue compression, elevation and topical steroid -Patient advised to return or notify a doctor immediately if symptoms worsen or persist or new concerns arise.  There are no Patient Instructions on file for this visit.   Kriste Basque R.

## 2012-05-15 ENCOUNTER — Ambulatory Visit
Admission: RE | Admit: 2012-05-15 | Discharge: 2012-05-15 | Disposition: A | Discharge status: Home / Self Care | Payer: Medicare Other | Source: Ambulatory Visit | Attending: Family Medicine | Admitting: Family Medicine

## 2012-05-15 DIAGNOSIS — L0390 Cellulitis, unspecified: Secondary | ICD-10-CM

## 2012-05-15 MED ORDER — GADOBENATE DIMEGLUMINE 529 MG/ML IV SOLN
10.0000 mL | Freq: Once | INTRAVENOUS | Status: AC | PRN
  Administered 2012-05-15: 10 mL via INTRAVENOUS

## 2012-05-16 ENCOUNTER — Telehealth: Payer: Self-pay | Admitting: Family Medicine

## 2012-05-16 MED ORDER — CLINDAMYCIN HCL 300 MG PO CAPS: 300.0000 mg | ORAL_CAPSULE | Freq: Three times a day (TID) | ORAL | Status: DC

## 2012-05-16 NOTE — Telephone Encounter (Signed)
[phone] (home)  Talked with patient about MRI results. While leg has improved some, has not resolved ans still appears to have cellulitis on MRI without signs of deeper infection. Pt denies pain in leg, fevers, chills and worsening of redness or swelling. We discussed tx options nad she would prefer to do one abx. Will try 7 day course of clinda to cover for strep and MRSA. Advised of potential adverse effects. Will follow up in 1 week. Elevation advised. Will follow up with her in 1 week.

## 2012-05-19 ENCOUNTER — Telehealth: Payer: Self-pay | Admitting: Family Medicine

## 2012-05-19 NOTE — Telephone Encounter (Signed)
[facility] Triage Call Report Triage Record Num: [id number] Operator: Di Kindle Patient Name: Shamyra Farias Call Date & Time: 05/17/2012 12:39:38PM Patient Phone: [phone] PCP: Kriste Basque Patient Gender: Female PCP Fax : Patient DOB: 08-Apr-1924 [facility]: [facility] Reason for Call: Caller: Cindy/daughter POA ; PCP: Kriste Basque (Family Practice); CB#: [phone]; Call following treatment with antibiotic: Clindamycin due to pharmacist telling her may cause diarrhea, which she is refusing to take with severe cramping in right leg, being treated for ongoing infection in right leg, not with patient. Health information. Protocol(s) Used: Information Only Call; No Symptom Triage (Adult) Recommended Outcome per Protocol: Caller to Return Call within 30 min Reason for Outcome: Caller not with patient and is unable to provide clinical information about patient to facilitate triage. Care Advice: ~ 11/09/

## 2012-05-22 ENCOUNTER — Other Ambulatory Visit: Payer: Medicare Other

## 2012-05-26 ENCOUNTER — Ambulatory Visit (INDEPENDENT_AMBULATORY_CARE_PROVIDER_SITE_OTHER): Payer: No Typology Code available for payment source | Admitting: Family Medicine

## 2012-05-26 ENCOUNTER — Encounter: Payer: Self-pay | Admitting: Family Medicine

## 2012-05-26 VITALS — BP 140/82 | HR 78 | Temp 97.7°F | Wt 117.0 lb

## 2012-05-26 DIAGNOSIS — L0291 Cutaneous abscess, unspecified: Secondary | ICD-10-CM

## 2012-05-26 DIAGNOSIS — L0390 Cellulitis, unspecified: Secondary | ICD-10-CM

## 2012-05-26 NOTE — Patient Instructions (Addendum)
-  We placed a referral for you as discussed. It usually takes about 1-2 weeks to process and schedule this referral. If you have not heard from us regarding this appointment in 2 weeks please contact our office.  

## 2012-05-26 NOTE — Progress Notes (Signed)
Chief Complaint  Patient presents with  . Follow-up    HPI: Follow up recurrent/persistent cellulitis R LE: -hospitalized in August and tx with clinda, ceftriaxone, IV vanc and then sent home on doxy -in September, one month after discharge, pt presented with recurrent cellulitis in LLE -tx with Bactrim and then doxy with elevation followed by compression and topical steroid cream for resulting edema - improved, then recurred -MRI last visit showed no signs of underlying infection in bone or soft tissues, but continued cellulitis - day 6/7  of clinda   -does not know anything about telephone call regarding her not taking this medication -pain has improved some -redness and swelling has improved on antibiotic ROS: See pertinent positives and negatives per HPI.  Past Medical History  Diagnosis Date  . History of rectal cancer   . History of colon polyps   . Cancer     rectal ca no chemo/radiation  . Asthma   . Blood in stool   . Chicken pox     Family History  Problem Relation Age of Onset  . Cancer Other     colon  . Heart disease Other     History   Social History  . Marital Status: Widowed    Spouse Name: N/A    Number of Children: N/A  . Years of Education: N/A   Social History Main Topics  . Smoking status: Never Smoker   . Smokeless tobacco: Never Used  . Alcohol Use: No  . Drug Use: No  . Sexually Active: No   Other Topics Concern  . None   Social History Narrative  . None    Current outpatient prescriptions:clindamycin (CLEOCIN) 300 MG capsule, Take 1 capsule (300 mg total) by mouth 3 (three) times daily., Disp: 21 capsule, Rfl: 0;  LOTEMAX 0.5 % GEL, Place 1 drop into both eyes daily. , Disp: , Rfl: ;  promethazine (PHENERGAN) 25 MG tablet, , Disp: , Rfl:  triamcinolone cream (KENALOG) 0.5 %, Apply topically 2 (two) times daily. Apply to affected area of leg twice daily and cover with vasoline or eucerin, Disp: 30 g, Rfl: 0  EXAM:  Filed Vitals:     05/26/12 1006  BP: 140/82  Pulse: 78  Temp: 97.7 F (36.5 C)    There is no height on file to calculate BMI.  GENERAL: vitals reviewed and listed above, alert, oriented, appears well hydrated and in no acute distress  HEENT: atraumatic, conjunttiva clear, no obvious abnormalities on inspection of external nose and ears  SKIN: R LE woody edema, tenderness and mild erythema - erythema and edema improved from last visit  MS: moves all extremities without noticeable abnormality  PSYCH: pleasant and cooperative, no obvious depression or anxiety  ASSESSMENT AND PLAN:  Discussed the following assessment and plan:  1. Cellulitis  Ambulatory referral to Infectious Disease  -s/p several courses of abx with recurrence of erythema, swelling and tenderness (no fevers or systemic symptoms)after each cessation of abx -s/p tx with compression stocking (pt only wears for half the day), elevation and topical steroid for resulting edema -some improvement on recent clinda, but still with some redness, warmth and TTP, pt is concerned will recur with cessation of abx -discussed case with ID and they will see pt, referral sent  -Patient advised to return or notify a doctor immediately if symptoms worsen or persist or new concerns arise.  Patient Instructions  -We placed a referral for you as discussed. It usually takes about 1-2  weeks to process and schedule this referral. If you have not heard from [place] regarding this appointment in 2 weeks please contact our office.      Colin Benton R.

## 2012-05-28 ENCOUNTER — Encounter: Payer: Self-pay | Admitting: Infectious Diseases

## 2012-05-28 ENCOUNTER — Telehealth: Payer: Self-pay | Admitting: Family Medicine

## 2012-05-28 ENCOUNTER — Ambulatory Visit (INDEPENDENT_AMBULATORY_CARE_PROVIDER_SITE_OTHER): Payer: Medicare Other | Admitting: Infectious Diseases

## 2012-05-28 VITALS — BP 189/81 | HR 79 | Temp 97.9°F | Ht 64.0 in | Wt 116.8 lb

## 2012-05-28 DIAGNOSIS — I10 Essential (primary) hypertension: Secondary | ICD-10-CM

## 2012-05-28 DIAGNOSIS — L0390 Cellulitis, unspecified: Secondary | ICD-10-CM

## 2012-05-28 MED ORDER — PENICILLIN V POTASSIUM 250 MG PO TABS: 250.0000 mg | ORAL_TABLET | Freq: Four times a day (QID) | ORAL | Status: DC

## 2012-05-28 NOTE — Telephone Encounter (Signed)
Please let [name] know that I read the notes from infectious disease. I see her blood pressure was running high and she should see me sometime in the next month to recheck this and address. Thanks.

## 2012-05-28 NOTE — Telephone Encounter (Signed)
Called and spoke with pt.  Pt is aware of Dr. Elmyra Ricks recommendations.  Pt states her bp was high because she went into the wrong building yesterday and had a time trying to get to the correct building.  Pt states her blood pressure was good yesterday.

## 2012-05-28 NOTE — Telephone Encounter (Signed)
Still advise we see her in the next month or so to recheck.

## 2012-05-28 NOTE — Assessment & Plan Note (Signed)
She will f/u with her PCP. Is asx.

## 2012-05-28 NOTE — Progress Notes (Signed)
  Subjective:    Patient ID: [name], female    DOB: 10/17/1923, 78 y.o.   MRN: [id number]  HPI 78 yo F with hx of HTN and recurrent LLE cellulitis. She was hospitalized for this in August 20 for 1 day. She had w/u for DVT (-). Her erythema of her RLE recurred in September of this year. She states that the erythema have not resolved. She has been on multiple courses of anbx (bactrim, doxy, clinda). Finished clinda last PM. She had MRI of her RLE (11-7) which showed possible cellulitis.  Has also been applying triamcinolone ointment to her leg. Feels like this helps some. Has been wearing support hose. Is from her knee to her ankle, does not extend to her foot. Does now report (with prompting) that she has numbness on the top of her foot.    Review of Systems  Constitutional: Negative for fever, chills, appetite change and unexpected weight change.  Cardiovascular: Positive for leg swelling. Negative for chest pain.  Gastrointestinal: Negative for diarrhea and constipation.  Genitourinary: Negative for difficulty urinating.  Neurological: Negative for headaches.       Objective:   Physical Exam  Constitutional: She appears well-developed and well-nourished.  HENT:  Mouth/Throat: No oropharyngeal exudate.  Eyes: EOM are normal. Pupils are equal, round, and reactive to light.  Neck: Neck supple.  Cardiovascular: Normal rate, regular rhythm and normal heart sounds.   Pulmonary/Chest: Effort normal and breath sounds normal.  Abdominal: Soft. Bowel sounds are normal. There is no tenderness.  Musculoskeletal:       Legs: Lymphadenopathy:    She has no cervical adenopathy.          Assessment & Plan:

## 2012-05-28 NOTE — Assessment & Plan Note (Signed)
Her leg does not appear infected to me. It is possible that she has phlebitis (as noted by her venous insufficiency) after her previous episode of cellulitis. I am worried about an underlying vascular cause of current erythema. I would suggest she get ABI and consider further vascular w/u. She is clear she does not want surgery, which I agree with given her age. I suggested that there may be further blood "thinners", medications that could help with this. I suggested we try low dose PEN to prevent further episodes of cellulitis. Will give her this for 3 months while her vascular eval is pending.  She will rtc prn.

## 2012-05-29 NOTE — Telephone Encounter (Signed)
Called and spoke with pt and pt is aware.  Pt states she will call back to schedule appt.  

## 2016-11-15 ENCOUNTER — Ambulatory Visit: Payer: No Typology Code available for payment source | Admitting: Podiatry

## 2020-10-16 ENCOUNTER — Encounter (HOSPITAL_BASED_OUTPATIENT_CLINIC_OR_DEPARTMENT_OTHER): Payer: Self-pay | Admitting: *Deleted

## 2020-10-16 ENCOUNTER — Emergency Department (HOSPITAL_BASED_OUTPATIENT_CLINIC_OR_DEPARTMENT_OTHER): Payer: 59

## 2020-10-16 ENCOUNTER — Other Ambulatory Visit: Payer: Self-pay

## 2020-10-16 ENCOUNTER — Emergency Department (HOSPITAL_BASED_OUTPATIENT_CLINIC_OR_DEPARTMENT_OTHER)
Admission: EM | Admit: 2020-10-16 | Discharge: 2020-10-16 | Disposition: A | Discharge status: Home / Self Care | Payer: 59 | Attending: Emergency Medicine | Admitting: Emergency Medicine

## 2020-10-16 DIAGNOSIS — I10 Essential (primary) hypertension: Secondary | ICD-10-CM | POA: Insufficient documentation

## 2020-10-16 DIAGNOSIS — Z85048 Personal history of other malignant neoplasm of rectum, rectosigmoid junction, and anus: Secondary | ICD-10-CM | POA: Diagnosis not present

## 2020-10-16 DIAGNOSIS — M79604 Pain in right leg: Secondary | ICD-10-CM | POA: Insufficient documentation

## 2020-10-16 DIAGNOSIS — J45909 Unspecified asthma, uncomplicated: Secondary | ICD-10-CM | POA: Insufficient documentation

## 2020-10-16 MED ORDER — AMLODIPINE BESYLATE 2.5 MG PO TABS: 2.5000 mg | ORAL_TABLET | Freq: Every day | ORAL | 0 refills | Status: AC

## 2020-10-16 MED ORDER — TRAMADOL HCL 50 MG PO TABS: 25.0000 mg | ORAL_TABLET | Freq: Three times a day (TID) | ORAL | 0 refills | Status: DC | PRN

## 2020-10-16 MED ORDER — AMLODIPINE BESYLATE 5 MG PO TABS
2.5000 mg | ORAL_TABLET | Freq: Once | ORAL | Status: AC
  Administered 2020-10-16: 2.5 mg via ORAL
  Filled 2020-10-16: qty 1

## 2020-10-16 MED ORDER — LIDOCAINE 5 % EX PTCH
1.0000 | MEDICATED_PATCH | CUTANEOUS | 0 refills | Status: DC
Start: 2020-10-16 — End: 2022-07-05

## 2020-10-16 NOTE — Discharge Instructions (Addendum)
Take the medications as needed for pain.  Follow-up with your primary care doctor for further evaluation

## 2020-10-16 NOTE — ED Provider Notes (Addendum)
[facility] Provider Note   CSN: [id number] Arrival date & time: 10/16/20  1447     History Chief Complaint  Patient presents with  . Leg Pain    [name] is a 87 y.o. female.  HPI   The history is provided by both the patient and her daughter.  Patient states she has been having pain for couple of weeks.  Initially she felt like she had pain in her foot but it has now moved up to her entire leg.  Patient states that during the day does not seem to be too bad but at night it is more uncomfortable and she has to get up.  The daughter states that the pain has been actually been ongoing for about a year or so.  The daughter states initially she was told it was just in her ankle and she had been seeing a skin doctor.  There is a small lesion overlying the medial malleolus of her right leg which actually has been improving.  Daughter was not aware that she has having pain in her leg until recently.  That is what actually brought her in.  Patient states that the pain does not increase with walking however the daughter disagrees and states her mother has complained of pain when she is trying to walk up steps etc.  She is not having any trouble with back pain.  She does feel like her foot gets numb sometimes.  Past Medical History:  Diagnosis Date  . Asthma   . Blood in stool   . Cancer ([place])    rectal ca no chemo/radiation  . Chicken pox   . History of colon polyps   . History of rectal cancer     Patient Active Problem List   Diagnosis Date Noted  . Swelling of left lower extremity 02/26/2012  . Cellulitis 02/26/2012  . HTN (hypertension) 02/26/2012  . PERSONAL HX COLON CANCER 03/23/2008  . ABDOMINAL BLOATING 11/04/2007  . ABDOMINAL PAIN, LEFT LOWER QUADRANT 11/04/2007    Past Surgical History:  Procedure Laterality Date  . colorectal resection surgery  2008  . TONSILLECTOMY AND ADENOIDECTOMY       OB History   No obstetric history on  file.     Family History  Problem Relation Age of Onset  . Cancer Other        colon  . Heart disease Other     Social History   Tobacco Use  . Smoking status: Never Smoker  . Smokeless tobacco: Never Used  Substance Use Topics  . Alcohol use: No  . Drug use: No    Home Medications Prior to Admission medications   Medication Sig Start Date End Date Taking? Authorizing Provider  lidocaine (LIDODERM) 5 % Place 1 patch onto the skin daily. Remove & Discard patch within 12 hours or as directed by MD 10/16/20  Yes Dorie Rank, MD  traMADol (ULTRAM) 50 MG tablet Take 0.5 tablets (25 mg total) by mouth every 8 (eight) hours as needed for severe pain. 10/16/20  Yes Dorie Rank, MD  clindamycin (CLEOCIN) 300 MG capsule Take 1 capsule (300 mg total) by mouth 3 (three) times daily. 05/16/12   Colin Benton R, DO  LOTEMAX 0.5 % GEL Place 1 drop into both eyes daily.  12/30/11   [provider]  penicillin v potassium (VEETID) 250 MG tablet Take 1 tablet (250 mg total) by mouth 4 (four) times daily. 05/28/12   Campbell Riches, MD  triamcinolone cream (KENALOG) 0.5 % Apply topically 2 (two) times daily. Apply to affected area of leg twice daily and cover with vasoline or eucerin 04/15/12   Lucretia Kern, DO    Allergies    Levofloxacin  Review of Systems   Review of Systems  All other systems reviewed and are negative.   Physical Exam Updated Vital Signs BP (!) 191/88 (BP Location: Left Arm)   Pulse 97   Temp 98.2 F (36.8 C) (Oral)   Resp 18   Ht 1.626 m (5\' 4" )   Wt 54.4 kg   SpO2 98%   BMI 20.60 kg/m   Physical Exam Vitals and nursing note reviewed.  Constitutional:      Appearance: She is well-developed. She is not toxic-appearing or diaphoretic.  HENT:     Head: Normocephalic and atraumatic.     Right Ear: External ear normal.     Left Ear: External ear normal.  Eyes:     General: No scleral icterus.       Right eye: No discharge.        Left eye: No  discharge.     Conjunctiva/sclera: Conjunctivae normal.  Neck:     Trachea: No tracheal deviation.  Cardiovascular:     Rate and Rhythm: Normal rate and regular rhythm.  Pulmonary:     Effort: Pulmonary effort is normal. No respiratory distress.     Breath sounds: Normal breath sounds. No stridor. No wheezing or rales.  Abdominal:     General: Bowel sounds are normal. There is no distension.     Palpations: Abdomen is soft.     Tenderness: There is no abdominal tenderness. There is no guarding or rebound.  Musculoskeletal:        General: No tenderness.     Cervical back: Neck supple.     Right lower leg: No edema.     Left lower leg: No edema.     Comments: Bilateral lower extremities warm well perfused, no cyanosis, palpable DP pulses, small skin lesion medial malleolus without any erythema, no tenderness palpation lumbar spine, no tenderness palpation knee no gross deformities noted in the extremities  Skin:    General: Skin is warm and dry.     Findings: No rash.  Neurological:     Mental Status: She is alert.     Cranial Nerves: No cranial nerve deficit (no facial droop, extraocular movements intact, no slurred speech).     Sensory: No sensory deficit.     Motor: No abnormal muscle tone or seizure activity.     Coordination: Coordination normal.     ED Results / Procedures / Treatments    Radiology DG Lumbar Spine Complete  Result Date: 10/16/2020 CLINICAL DATA:  Leg pain. EXAM: LUMBAR SPINE - COMPLETE 4+ VIEW COMPARISON:  CT of the abdomen and pelvis January 02, 2007 FINDINGS: Osteopenia. No definite compression fractures of the visualized vertebral bodies. Multilevel osteoarthritic changes with endplate sclerosis, disc space narrowing and osteophyte formation. Associated posterior facet arthropathy. IMPRESSION: 1. No definite compression fractures of the visualized vertebral bodies. 2. Multilevel osteoarthritic changes. Electronically Signed   By: Fidela Salisbury M.D.    On: 10/16/2020 17:44   DG Hip Unilat W or Wo Pelvis 2-3 Views Right  Result Date: 10/16/2020 CLINICAL DATA:  Right leg pain.  No reported injury. EXAM: DG HIP (WITH OR WITHOUT PELVIS) 2-3V RIGHT COMPARISON:  None. FINDINGS: Diffuse osteopenia. Normal appearing hips. 1.1 cm rounded calcification in the inferior pelvis  in the midline. Mild lower lumbar spine degenerative changes. Mild atheromatous arterial calcifications. IMPRESSION: 1. No acute abnormality. 2. 1.1 cm rounded calcification in the inferior pelvis in the midline. This could represent a bladder calculus. This is more inferiorly located than expected for a uterine fibroid. Electronically Signed   By: Claudie Revering M.D.   On: 10/16/2020 17:43    Procedures Procedures   Medications Ordered in ED Medications - No data to display  ED Course  I have reviewed the triage vital signs and the nursing notes.  Pertinent labs & imaging results that were available during my care of the patient were reviewed by me and considered in my medical decision making (see chart for details).    MDM Rules/Calculators/A&P                          Patient's symptoms are suggestive of possible radiculopathy.  She does not have any signs of infection or cellulitis.  No signs of acute vascular compromise.  Patient has been having some numbness in her foot and I suspect possible radiculopathy.  She also was noted to be hypertensive.  I did review prior records.  Patient was hypertensive back in 2013.  She has not seen a primary care doctor in approximately 10 years.  I do recommend following with primary care doctor to follow-up on her blood pressure but do not feel she needs any emergent treatment for her hypertension at this time. Final Clinical Impression(s) / ED Diagnoses Final diagnoses:  Hypertension, unspecified type  Pain of right lower extremity    Rx / DC Orders ED Discharge Orders         Ordered    traMADol (ULTRAM) 50 MG tablet  Every 8  hours PRN        10/16/20 1822    lidocaine (LIDODERM) 5 %  Every 24 hours        10/16/20 Lenard Lance, MD 10/16/20 1824 Blood pressure remains elevated.  We will give her dose of Norvasc and give her a prescription for that   Dorie Rank, MD 10/16/20 1900

## 2020-10-16 NOTE — ED Triage Notes (Signed)
Pt reports sore on her right ankle x > 1 year. States leg pain that is radiating up from the sore into her groin x 2 months.  Increased pain at night.

## 2020-10-20 DIAGNOSIS — M545 Low back pain, unspecified: Secondary | ICD-10-CM | POA: Insufficient documentation

## 2020-11-14 DIAGNOSIS — M5416 Radiculopathy, lumbar region: Secondary | ICD-10-CM | POA: Diagnosis present

## 2020-12-10 DIAGNOSIS — M51369 Other intervertebral disc degeneration, lumbar region without mention of lumbar back pain or lower extremity pain: Secondary | ICD-10-CM | POA: Insufficient documentation

## 2020-12-10 DIAGNOSIS — M5136 Other intervertebral disc degeneration, lumbar region: Secondary | ICD-10-CM | POA: Insufficient documentation

## 2022-06-30 ENCOUNTER — Inpatient Hospital Stay (HOSPITAL_BASED_OUTPATIENT_CLINIC_OR_DEPARTMENT_OTHER)
Admission: EM | Admit: 2022-06-30 | Discharge: 2022-07-01 | DRG: 389 | Discharge status: Against Medical Advice | Payer: Medicare Other | Attending: Internal Medicine | Admitting: Internal Medicine

## 2022-06-30 ENCOUNTER — Encounter (HOSPITAL_BASED_OUTPATIENT_CLINIC_OR_DEPARTMENT_OTHER): Payer: Self-pay | Admitting: Emergency Medicine

## 2022-06-30 ENCOUNTER — Emergency Department (HOSPITAL_BASED_OUTPATIENT_CLINIC_OR_DEPARTMENT_OTHER): Payer: Medicare Other

## 2022-06-30 ENCOUNTER — Encounter (HOSPITAL_COMMUNITY): Payer: Self-pay

## 2022-06-30 DIAGNOSIS — R7989 Other specified abnormal findings of blood chemistry: Secondary | ICD-10-CM

## 2022-06-30 DIAGNOSIS — J4489 Other specified chronic obstructive pulmonary disease: Secondary | ICD-10-CM | POA: Diagnosis present

## 2022-06-30 DIAGNOSIS — K4030 Unilateral inguinal hernia, with obstruction, without gangrene, not specified as recurrent: Secondary | ICD-10-CM

## 2022-06-30 DIAGNOSIS — Z86010 Personal history of colonic polyps: Secondary | ICD-10-CM

## 2022-06-30 DIAGNOSIS — Z6820 Body mass index (BMI) 20.0-20.9, adult: Secondary | ICD-10-CM

## 2022-06-30 DIAGNOSIS — Z79899 Other long term (current) drug therapy: Secondary | ICD-10-CM

## 2022-06-30 DIAGNOSIS — K4090 Unilateral inguinal hernia, without obstruction or gangrene, not specified as recurrent: Secondary | ICD-10-CM | POA: Diagnosis present

## 2022-06-30 DIAGNOSIS — Z800 Family history of malignant neoplasm of digestive organs: Secondary | ICD-10-CM

## 2022-06-30 DIAGNOSIS — K56609 Unspecified intestinal obstruction, unspecified as to partial versus complete obstruction: Principal | ICD-10-CM

## 2022-06-30 DIAGNOSIS — Z5329 Procedure and treatment not carried out because of patient's decision for other reasons: Secondary | ICD-10-CM | POA: Diagnosis present

## 2022-06-30 DIAGNOSIS — R197 Diarrhea, unspecified: Secondary | ICD-10-CM | POA: Diagnosis present

## 2022-06-30 DIAGNOSIS — E441 Mild protein-calorie malnutrition: Secondary | ICD-10-CM | POA: Diagnosis present

## 2022-06-30 DIAGNOSIS — I10 Essential (primary) hypertension: Secondary | ICD-10-CM | POA: Diagnosis present

## 2022-06-30 DIAGNOSIS — E785 Hyperlipidemia, unspecified: Secondary | ICD-10-CM | POA: Diagnosis present

## 2022-06-30 DIAGNOSIS — K439 Ventral hernia without obstruction or gangrene: Secondary | ICD-10-CM | POA: Diagnosis present

## 2022-06-30 DIAGNOSIS — Z85048 Personal history of other malignant neoplasm of rectum, rectosigmoid junction, and anus: Secondary | ICD-10-CM

## 2022-06-30 DIAGNOSIS — I2489 Other forms of acute ischemic heart disease: Secondary | ICD-10-CM | POA: Diagnosis present

## 2022-06-30 LAB — CBC
HCT: 41 % (ref 36.0–46.0)
Hemoglobin: 13.2 g/dL (ref 12.0–15.0)
MCH: 30.1 pg (ref 26.0–34.0)
MCHC: 32.2 g/dL (ref 30.0–36.0)
MCV: 93.6 fL (ref 80.0–100.0)
Platelets: 222 10*3/uL (ref 150–400)
RBC: 4.38 MIL/uL (ref 3.87–5.11)
RDW: 13.8 % (ref 11.5–15.5)
WBC: 8.4 10*3/uL (ref 4.0–10.5)
nRBC: 0 % (ref 0.0–0.2)

## 2022-06-30 LAB — URINALYSIS, ROUTINE W REFLEX MICROSCOPIC
Bilirubin Urine: NEGATIVE
Glucose, UA: NEGATIVE mg/dL
Hgb urine dipstick: NEGATIVE
Ketones, ur: 40 mg/dL — AB
Nitrite: NEGATIVE
Specific Gravity, Urine: >1.046 — ABNORMAL HIGH (ref 1.005–1.030)
pH: 5.5 (ref 5.0–8.0)

## 2022-06-30 LAB — COMPREHENSIVE METABOLIC PANEL
ALT: 10 U/L (ref 0–44)
AST: 22 U/L (ref 15–41)
Albumin: 4.1 g/dL (ref 3.5–5.0)
Alkaline Phosphatase: 51 U/L (ref 38–126)
Anion gap: 13 (ref 5–15)
BUN: 53 mg/dL — ABNORMAL HIGH (ref 8–23)
CO2: 27 mmol/L (ref 22–32)
Calcium: 9.3 mg/dL (ref 8.9–10.3)
Chloride: 98 mmol/L (ref 98–111)
Creatinine, Ser: 1.16 mg/dL — ABNORMAL HIGH (ref 0.44–1.00)
GFR, Estimated: 43 mL/min — ABNORMAL LOW (ref >60)
Glucose, Bld: 104 mg/dL — ABNORMAL HIGH (ref 70–99)
Potassium: 4.4 mmol/L (ref 3.5–5.1)
Sodium: 138 mmol/L (ref 135–145)
Total Bilirubin: 1.2 mg/dL (ref 0.3–1.2)
Total Protein: 7.3 g/dL (ref 6.5–8.1)

## 2022-06-30 LAB — LIPASE, BLOOD: Lipase: 43 U/L (ref 11–51)

## 2022-06-30 LAB — TROPONIN I (HIGH SENSITIVITY)
Troponin I (High Sensitivity): 315 ng/L (ref <18)
Troponin I (High Sensitivity): 278 ng/L (ref <18)

## 2022-06-30 MED ORDER — LACTATED RINGERS IV BOLUS
1000.0000 mL | Freq: Once | INTRAVENOUS | Status: AC
  Administered 2022-06-30: 1000 mL via INTRAVENOUS

## 2022-06-30 MED ORDER — ONDANSETRON HCL 4 MG/2ML IJ SOLN
4.0000 mg | Freq: Once | INTRAMUSCULAR | Status: AC
  Administered 2022-06-30: 4 mg via INTRAVENOUS
  Filled 2022-06-30: qty 2

## 2022-06-30 MED ORDER — IOHEXOL 300 MG/ML  SOLN
80.0000 mL | Freq: Once | INTRAMUSCULAR | Status: AC | PRN
  Administered 2022-06-30: 80 mL via INTRAVENOUS

## 2022-06-30 NOTE — ED Notes (Signed)
Consult for Hospitalist - Called [facility] at 800pm, spoke to [place]. - ReCalled [facility] at 835pm, spoke to sally.

## 2022-06-30 NOTE — Consult Note (Incomplete)
Reason for Consult/Chief Complaint: inguinal hernia Consultant: Maryan Rued, MD  [name] is an 88 y.o. female.   HPI: ***  Past Medical History:  Diagnosis Date   Asthma    Blood in stool    Cancer ([place])    rectal ca no chemo/radiation   Chicken pox    History of colon polyps    History of rectal cancer     Past Surgical History:  Procedure Laterality Date   colorectal resection surgery  2008   TONSILLECTOMY AND ADENOIDECTOMY      Family History  Problem Relation Age of Onset   Cancer Other        colon   Heart disease Other     Social History:  reports that she has never smoked. She has never used smokeless tobacco. She reports that she does not drink alcohol and does not use drugs.  Allergies:  Allergies  Allergen Reactions   Levofloxacin     REACTION: Got very hot and turned red all over    Medications: I have reviewed the patient's current medications.  Results for orders placed or performed during the hospital encounter of 06/30/22 (from the past 48 hour(s))  Lipase, blood     Status: None   Collection Time: 06/30/22 11:59 AM  Result Value Ref Range   Lipase 43 11 - 51 U/L    Comment: Performed at [facility], [place], [place] [address]  Comprehensive metabolic panel     Status: Abnormal   Collection Time: 06/30/22 11:59 AM  Result Value Ref Range   Sodium 138 135 - 145 mmol/L   Potassium 4.4 3.5 - 5.1 mmol/L   Chloride 98 98 - 111 mmol/L   CO2 27 22 - 32 mmol/L   Glucose, Bld 104 (H) 70 - 99 mg/dL    Comment: Glucose reference range applies only to samples taken after fasting for at least 8 hours.   BUN 53 (H) 8 - 23 mg/dL   Creatinine, Ser 1.16 (H) 0.44 - 1.00 mg/dL   Calcium 9.3 8.9 - 10.3 mg/dL   Total Protein 7.3 6.5 - 8.1 g/dL   Albumin 4.1 3.5 - 5.0 g/dL   AST 22 15 - 41 U/L   ALT 10 0 - 44 U/L   Alkaline Phosphatase 51 38 - 126 U/L   Total Bilirubin 1.2 0.3 - 1.2 mg/dL   GFR, Estimated 43 (L)  >60 mL/min    Comment: (NOTE) Calculated using the CKD-EPI Creatinine Equation (2021)    Anion gap 13 5 - 15    Comment: Performed at [facility], [address], [place], [place] [address]  CBC     Status: None   Collection Time: 06/30/22 11:59 AM  Result Value Ref Range   WBC 8.4 4.0 - 10.5 K/uL   RBC 4.38 3.87 - 5.11 MIL/uL   Hemoglobin 13.2 12.0 - 15.0 g/dL   HCT 41.0 36.0 - 46.0 %   MCV 93.6 80.0 - 100.0 fL   MCH 30.1 26.0 - 34.0 pg   MCHC 32.2 30.0 - 36.0 g/dL   RDW 13.8 11.5 - 15.5 %   Platelets 222 150 - 400 K/uL   nRBC 0.0 0.0 - 0.2 %    Comment: Performed at [facility], [place], [place] [address]  Troponin I (High Sensitivity)     Status: Abnormal   Collection Time: 06/30/22  5:39 PM  Result Value Ref Range  Troponin I (High Sensitivity) 315 (HH) <18 ng/L    Comment: CRITICAL RESULT CALLED TO, READ BACK BY AND VERIFIED WITH: MEGAN RUGGIERRO @ 1191 06/30/2022 PERRY,J. (NOTE) Elevated high sensitivity troponin I (hsTnI) values and significant  changes across serial measurements may suggest ACS but many other  chronic and acute conditions are known to elevate hsTnI results.  Refer to the Links section for chest pain algorithms and additional  guidance. Performed at [facility], [address] [place], [place] [address]     CT ABDOMEN PELVIS W CONTRAST  Result Date: 06/30/2022 CLINICAL DATA:  Nausea vomiting and diarrhea with pain to left groin area EXAM: CT ABDOMEN AND PELVIS WITH CONTRAST TECHNIQUE: Multidetector CT imaging of the abdomen and pelvis was performed using the standard protocol following bolus administration of intravenous contrast. RADIATION DOSE REDUCTION: This exam was performed according to the departmental dose-optimization program which includes automated exposure control, adjustment of the mA and/or kV according to patient size and/or use of iterative reconstruction  technique. CONTRAST:  64m OMNIPAQUE IOHEXOL 300 MG/ML  SOLN COMPARISON:  CT abdomen and pelvis dated July 31, 2007 FINDINGS: Lower chest: Cardiomegaly.  Mitral annular calcifications. Hepatobiliary: No focal liver abnormality is seen. No gallstones, gallbladder wall thickening, or biliary dilatation. Pancreas: Unremarkable. No pancreatic ductal dilatation or surrounding inflammatory changes. Spleen: Normal in size without focal abnormality. Adrenals/Urinary Tract: Bilateral adrenal glands are unremarkable. No hydronephrosis or nephrolithiasis. Bilateral low-attenuation renal lesions, largest are compatible with simple cysts, others are too small to completely characterize, no specific follow-up imaging is recommended for these lesions. Thick-walled and trabeculated urinary bladder. Stomach/Bowel: Multiple dilated loops of small bowel with transition point at the neck of a moderate sized left inguinal hernia. Decompressed distal small bowel loops are seen distal to the hernia sac. No evidence of bowel wall thickening or pneumatosis. Vascular/Lymphatic: Severe aortic atherosclerosis. No enlarged abdominal or pelvic lymph nodes. Reproductive: Multi fibroid uterus Other: No abdominopelvic ascites or intra-free air. Musculoskeletal: No acute or significant osseous findings. IMPRESSION: 1. Multiple dilated loops of small bowel with gradual transition to decompressed bowel at the neck of a moderate sized left inguinal hernia, findings are favored to be due to small-bowel obstruction secondary to hernia. 2. Thick-walled and trabeculated urinary bladder, suggestive of chronic bladder dysfunction. Recommend correlation with urinalysis to exclude infection. 3. Aortic Atherosclerosis (ICD10-I70.0). Electronically Signed   By: LYetta GlassmanM.D.   On: 06/30/2022 18:29    ROS 10 point review of systems is negative except as listed above in HPI.   Physical Exam Blood pressure (!) 140/75, pulse 93, temperature 97.6 F  (36.4 C), temperature source Oral, resp. rate 20, height '5\' 4"'$  (1.626 m), weight 54.4 kg, SpO2 100 %. Constitutional: well-developed, well-nourished*** HEENT: pupils equal, round, reactive to light, 2***mm b/l, moist conjunctiva, external inspection of ears and nose normal, hearing {Blank single:19197::"intact","diminished","unable to be assessed"} Oropharynx: normal oropharyngeal mucosa, {Blank single:19197::"normal","poor"} dentition Neck: no thyromegaly, trachea midline, {Blank single:19197::"+","no","unable to assess"} midline cervical tenderness to palpation Chest: breath sounds equal bilaterally, {Blank single:19197::"normal","absent","labored"} respiratory effort, {Blank single:19197::"+","no"} midline or lateral chest wall tenderness to palpation/deformity Abdomen: soft, NT, no bruising, no hepatosplenomegaly GU: {Blank single:19197::"no blood at urethral meatus of penis, no scrotal masses or abnormality","normal female genitalia"}  Back: no wounds, {Blank single:19197::"+","no","unable to assess"} thoracic/lumbar spine tenderness to palpation, {Blank single:19197::"+","no"} thoracic/lumbar spine stepoffs Rectal: {Blank single:19197::"good tone, no blood","deferred"} Extremities: 2+ radial and pedal pulses bilaterally, {Blank single:19197::"intact","unable to assess"} motor and sensation bilateral UE and LE, {Blank  single:19197::"+","no"} peripheral edema MSK: {Blank single:19197::"normal","abnormal","unable to assess"} gait/station, no clubbing/cyanosis of fingers/toes, {Blank single:19197::"normal","limited","unable to assess"} ROM of all four extremities Skin: warm, dry, no rashes Psych: {Blank single:19197::"normal memory, normal mood/affect","unable to assess"}     Assessment/Plan: ***   Jesusita Oka, MD General and [facility]

## 2022-06-30 NOTE — Plan of Care (Signed)
[facility] will assume care on arrival to accepting facility. Until arrival, care as per EDP. However, [facility] available 24/7 for questions and assistance.  Nursing staff, please page [facility] Admits and Consults ([phone]) as soon as the patient arrives to the hospital.   

## 2022-06-30 NOTE — ED Triage Notes (Signed)
Patient presents with family C/O dec appetite, abdominal pain, N/V/D, since Sunday. Also pain to L groin area, hx if inguinal hernia.

## 2022-06-30 NOTE — ED Provider Notes (Signed)
[facility] EMERGENCY DEPT Provider Note   CSN: [id number] Arrival date & time: 06/30/22  1127     History  Chief Complaint  Patient presents with   Abdominal Pain    [name] is a 88 y.o. female presents emergency department with chief complaint of vomiting and poor oral intake.  History given by the patient.  1 week ago this past Sunday the patient ate something and began having nausea and vomiting.  She has not had any desire to eat since that time.  She has persistent nausea and has only taken sips of water.  Her daughter states that she has a friend who does mobile nursing who brought her an IV and noticed that she has a large left lower abdominal/inguinal hernia and was concerned she could have a bowel obstruction asked her to come here.  She did receive a liter of fluid prior to arrival.  She has persistent nausea.  She denies chest pain or fevers.  She denies urinary symptoms.  Her hernia only hurts when she stands or walks.  She normally uses a panty girdle to apply outward pressure toward it.   Abdominal Pain      Home Medications Prior to Admission medications   Medication Sig Start Date End Date Taking? Authorizing Provider  amLODipine (NORVASC) 2.5 MG tablet Take 1 tablet (2.5 mg total) by mouth daily. 10/16/20 11/15/20  Dorie Rank, MD  clindamycin (CLEOCIN) 300 MG capsule Take 1 capsule (300 mg total) by mouth 3 (three) times daily. 05/16/12   Lucretia Kern, DO  lidocaine (LIDODERM) 5 % Place 1 patch onto the skin daily. Remove & Discard patch within 12 hours or as directed by MD 10/16/20   Dorie Rank, MD  LOTEMAX 0.5 % GEL Place 1 drop into both eyes daily.  12/30/11   [provider]  penicillin v potassium (VEETID) 250 MG tablet Take 1 tablet (250 mg total) by mouth 4 (four) times daily. 05/28/12   Campbell Riches, MD  traMADol (ULTRAM) 50 MG tablet Take 0.5 tablets (25 mg total) by mouth every 8 (eight) hours as needed for severe pain.  10/16/20   Dorie Rank, MD  triamcinolone cream (KENALOG) 0.5 % Apply topically 2 (two) times daily. Apply to affected area of leg twice daily and cover with vasoline or eucerin 04/15/12   Lucretia Kern, DO      Allergies    Levofloxacin    Review of Systems   Review of Systems  Gastrointestinal:  Positive for abdominal pain.    Physical Exam Updated Vital Signs BP (!) 117/100   Pulse 96   Temp 97.6 F (36.4 C) (Oral)   Resp 14   Ht '5\' 4"'$  (1.626 m)   Wt 54.4 kg   SpO2 100%   BMI 20.60 kg/m  Physical Exam Vitals and nursing note reviewed.  Constitutional:      General: She is not in acute distress.    Appearance: She is well-developed. She is not diaphoretic.  HENT:     Head: Normocephalic and atraumatic.     Right Ear: External ear normal.     Left Ear: External ear normal.     Nose: Nose normal.     Mouth/Throat:     Mouth: Mucous membranes are moist.  Eyes:     General: No scleral icterus.    Conjunctiva/sclera: Conjunctivae normal.  Cardiovascular:     Rate and Rhythm: Normal rate and regular rhythm.     Heart  sounds: Normal heart sounds. No murmur heard.    No friction rub. No gallop.  Pulmonary:     Effort: Pulmonary effort is normal. No respiratory distress.     Breath sounds: Normal breath sounds.  Abdominal:     General: Bowel sounds are normal. There is no distension.     Palpations: Abdomen is soft. There is no mass.     Tenderness: There is no abdominal tenderness. There is no guarding.     Hernia: A hernia is present. Hernia is present in the left inguinal area.     Comments: Abdominal distention noted from the umbilicus down.  There is easily reducible hernia near the left inguina  Musculoskeletal:     Cervical back: Normal range of motion.  Skin:    General: Skin is warm and dry.  Neurological:     Mental Status: She is alert and oriented to person, place, and time.  Psychiatric:        Behavior: Behavior normal.     ED Results / Procedures /  Treatments   Labs (all labs ordered are listed, but only abnormal results are displayed) Labs Reviewed  COMPREHENSIVE METABOLIC PANEL - Abnormal; Notable for the following components:      Result Value   Glucose, Bld 104 (*)    BUN 53 (*)    Creatinine, Ser 1.16 (*)    GFR, Estimated 43 (*)    All other components within normal limits  URINALYSIS, ROUTINE W REFLEX MICROSCOPIC - Abnormal; Notable for the following components:   Specific Gravity, Urine >1.046 (*)    Ketones, ur 40 (*)    Protein, ur TRACE (*)    Leukocytes,Ua MODERATE (*)    Bacteria, UA RARE (*)    All other components within normal limits  TROPONIN I (HIGH SENSITIVITY) - Abnormal; Notable for the following components:   Troponin I (High Sensitivity) 315 (*)    All other components within normal limits  TROPONIN I (HIGH SENSITIVITY) - Abnormal; Notable for the following components:   Troponin I (High Sensitivity) 278 (*)    All other components within normal limits  LIPASE, BLOOD  CBC    EKG EKG Interpretation  Date/Time:  Saturday June 30 2022 17:49:14 EST Ventricular Rate:  78 PR Interval:  175 QRS Duration: 87 QT Interval:  411 QTC Calculation: 469 R Axis:   21 Text Interpretation: Sinus rhythm Atrial premature complexes RSR' in V1 or V2, right VCD or RVH No significant change since last tracing Confirmed by Blanchie Dessert [phone]) on 06/30/2022 5:56:43 PM  Radiology CT ABDOMEN PELVIS W CONTRAST  Result Date: 06/30/2022 CLINICAL DATA:  Nausea vomiting and diarrhea with pain to left groin area EXAM: CT ABDOMEN AND PELVIS WITH CONTRAST TECHNIQUE: Multidetector CT imaging of the abdomen and pelvis was performed using the standard protocol following bolus administration of intravenous contrast. RADIATION DOSE REDUCTION: This exam was performed according to the departmental dose-optimization program which includes automated exposure control, adjustment of the mA and/or kV according to patient size  and/or use of iterative reconstruction technique. CONTRAST:  66m OMNIPAQUE IOHEXOL 300 MG/ML  SOLN COMPARISON:  CT abdomen and pelvis dated July 31, 2007 FINDINGS: Lower chest: Cardiomegaly.  Mitral annular calcifications. Hepatobiliary: No focal liver abnormality is seen. No gallstones, gallbladder wall thickening, or biliary dilatation. Pancreas: Unremarkable. No pancreatic ductal dilatation or surrounding inflammatory changes. Spleen: Normal in size without focal abnormality. Adrenals/Urinary Tract: Bilateral adrenal glands are unremarkable. No hydronephrosis or nephrolithiasis. Bilateral low-attenuation renal lesions,  largest are compatible with simple cysts, others are too small to completely characterize, no specific follow-up imaging is recommended for these lesions. Thick-walled and trabeculated urinary bladder. Stomach/Bowel: Multiple dilated loops of small bowel with transition point at the neck of a moderate sized left inguinal hernia. Decompressed distal small bowel loops are seen distal to the hernia sac. No evidence of bowel wall thickening or pneumatosis. Vascular/Lymphatic: Severe aortic atherosclerosis. No enlarged abdominal or pelvic lymph nodes. Reproductive: Multi fibroid uterus Other: No abdominopelvic ascites or intra-free air. Musculoskeletal: No acute or significant osseous findings. IMPRESSION: 1. Multiple dilated loops of small bowel with gradual transition to decompressed bowel at the neck of a moderate sized left inguinal hernia, findings are favored to be due to small-bowel obstruction secondary to hernia. 2. Thick-walled and trabeculated urinary bladder, suggestive of chronic bladder dysfunction. Recommend correlation with urinalysis to exclude infection. 3. Aortic Atherosclerosis (ICD10-I70.0). Electronically Signed   By: Yetta Glassman M.D.   On: 06/30/2022 18:29    Procedures Hernia reduction  Date/Time: 06/30/2022 11:44 PM  Performed by: Margarita Mail,  PA-C Authorized by: Margarita Mail, PA-C  Consent: Verbal consent obtained. Risks and benefits: risks, benefits and alternatives were discussed Consent given by: patient Patient identity confirmed: verbally with patient Time out: Immediately prior to procedure a "time out" was called to verify the correct patient, procedure, equipment, support staff and site/side marked as required. Preparation: Patient was prepped and draped in the usual sterile fashion. Local anesthesia used: no  Anesthesia: Local anesthesia used: no  Sedation: Patient sedated: no  Comments: The hernia was easily reducible but immediately fell back out       Medications Ordered in ED Medications  ondansetron (ZOFRAN) injection 4 mg (4 mg Intravenous Given 06/30/22 1741)  lactated ringers bolus 1,000 mL ( Intravenous Stopped 06/30/22 1911)  iohexol (OMNIPAQUE) 300 MG/ML solution 80 mL (80 mLs Intravenous Contrast Given 06/30/22 1756)    ED Course/ Medical Decision Making/ A&P                           Medical Decision Making 88 year old female who presents emergency department with chief complaint of nausea, poor oral intake and vomiting earlier this week. The emergent differential diagnosis for vomiting includes, but is not limited to ACS/MI, DKA, Ischemic bowel, Meningitis, Sepsis, Acute gastric dilation, Adrenal insufficiency, Appendicitis,  Bowel obstruction/ileus, Carbon monoxide poisoning, Cholecystitis, Electrolyte abnormalities, Elevated ICP, Gastric outlet obstruction, Pancreatitis, Ruptured viscus, Biliary colic, Cannabinoid hyperemesis syndrome, Gastritis, Gastroenteritis, Gastroparesis,  Narcotic withdrawal, Peptic ulcer disease, and UTI   She has a known inguinal hernia which has been present for a very long time and she normally uses a panty girdle to hold it in  I visualized and interpreted CT abdomen and pelvis which shows small bowel obstruction secondary to bowel loops present in the inguinal  hernia and thickening of the bladder can concerning for potential infection.  She has no urinary symptoms.   Patient having no active vomiting and no significant pain at rest.  I reviewed the patient's labs which shows contaminated urine without evidence of infection, initial troponin elevated at 315, no active chest pain.  Lipase within normal limits, CMP shows mild change in the patient's creatinine as compared to 10 years ago and likely does represent dehydration with a BUN of 53.  BBC is within normal limits.   Case discussed with Dr. Bobbye Morton who recommends trying to put the hernia in to reduce symptoms of small  bowel obstruction.  Unfortunately there is no way to reduce the hernia because it just falls back out.   I discussed all findings with patient's and patient's family at bedside.  Case discussed with Dr. Marlowe Sax who will admit the patient.  Surgery will consult.  She appears otherwise appropriate for admission.  Amount and/or Complexity of Data Reviewed Labs: ordered. Radiology: ordered. ECG/medicine tests: ordered and independent interpretation performed.    Details: I personally interpreted EKG shows sinus rhythm at a rate of 78  Risk Prescription drug management. Decision regarding hospitalization.           Final Clinical Impression(s) / ED Diagnoses Final diagnoses:  SBO (small bowel obstruction) (HCC)  Non-recurrent unilateral inguinal hernia with obstruction without gangrene  Elevated troponin I level    Rx / DC Orders ED Discharge Orders     None         Margarita Mail, PA-C 06/30/22 2348    Blanchie Dessert, MD 07/02/22 1909

## 2022-07-01 ENCOUNTER — Other Ambulatory Visit: Payer: Self-pay

## 2022-07-01 ENCOUNTER — Inpatient Hospital Stay (HOSPITAL_COMMUNITY): Payer: Medicare Other

## 2022-07-01 DIAGNOSIS — I2489 Other forms of acute ischemic heart disease: Secondary | ICD-10-CM | POA: Diagnosis present

## 2022-07-01 DIAGNOSIS — E441 Mild protein-calorie malnutrition: Secondary | ICD-10-CM | POA: Diagnosis present

## 2022-07-01 DIAGNOSIS — J4489 Other specified chronic obstructive pulmonary disease: Secondary | ICD-10-CM | POA: Diagnosis present

## 2022-07-01 DIAGNOSIS — K4090 Unilateral inguinal hernia, without obstruction or gangrene, not specified as recurrent: Secondary | ICD-10-CM | POA: Diagnosis present

## 2022-07-01 DIAGNOSIS — E785 Hyperlipidemia, unspecified: Secondary | ICD-10-CM | POA: Diagnosis present

## 2022-07-01 DIAGNOSIS — I10 Essential (primary) hypertension: Secondary | ICD-10-CM | POA: Diagnosis present

## 2022-07-01 DIAGNOSIS — Z800 Family history of malignant neoplasm of digestive organs: Secondary | ICD-10-CM | POA: Diagnosis not present

## 2022-07-01 DIAGNOSIS — Z6820 Body mass index (BMI) 20.0-20.9, adult: Secondary | ICD-10-CM | POA: Diagnosis not present

## 2022-07-01 DIAGNOSIS — R197 Diarrhea, unspecified: Secondary | ICD-10-CM | POA: Diagnosis present

## 2022-07-01 DIAGNOSIS — Z5329 Procedure and treatment not carried out because of patient's decision for other reasons: Secondary | ICD-10-CM | POA: Diagnosis present

## 2022-07-01 DIAGNOSIS — Z86010 Personal history of colonic polyps: Secondary | ICD-10-CM | POA: Diagnosis not present

## 2022-07-01 DIAGNOSIS — K56609 Unspecified intestinal obstruction, unspecified as to partial versus complete obstruction: Secondary | ICD-10-CM

## 2022-07-01 DIAGNOSIS — Z79899 Other long term (current) drug therapy: Secondary | ICD-10-CM | POA: Diagnosis not present

## 2022-07-01 DIAGNOSIS — Z85048 Personal history of other malignant neoplasm of rectum, rectosigmoid junction, and anus: Secondary | ICD-10-CM | POA: Diagnosis not present

## 2022-07-01 DIAGNOSIS — K439 Ventral hernia without obstruction or gangrene: Secondary | ICD-10-CM | POA: Diagnosis present

## 2022-07-01 MED ORDER — ENOXAPARIN SODIUM 30 MG/0.3ML IJ SOSY: 30.0000 mg | PREFILLED_SYRINGE | INTRAMUSCULAR | Status: DC

## 2022-07-01 MED ORDER — ONDANSETRON HCL 4 MG/2ML IJ SOLN
4.0000 mg | Freq: Four times a day (QID) | INTRAMUSCULAR | Status: DC | PRN
  Administered 2022-07-01: 4 mg via INTRAVENOUS
  Filled 2022-07-01: qty 2

## 2022-07-01 MED ORDER — ASPIRIN 300 MG RE SUPP
150.0000 mg | Freq: Every day | RECTAL | Status: DC
  Filled 2022-07-01: qty 1

## 2022-07-01 MED ORDER — TRIAMCINOLONE ACETONIDE 0.5 % EX CREA
TOPICAL_CREAM | Freq: Two times a day (BID) | CUTANEOUS | Status: DC
  Filled 2022-07-01: qty 15

## 2022-07-01 MED ORDER — HYDROMORPHONE HCL 1 MG/ML IJ SOLN: 0.5000 mg | INTRAMUSCULAR | Status: DC | PRN

## 2022-07-01 MED ORDER — SODIUM CHLORIDE 0.9 % IV SOLN: INTRAVENOUS | Status: DC

## 2022-07-01 NOTE — ED Notes (Signed)
Daughter is refusing to have her mother transferred to [facility] ER to see the surgeon or to wait for a room. She prefers for her mother to stay in our ED and wait until she is assigned a bed before being transferred over. She also wants to talk to the surgeon before having surgery as she is afraid the "surgery will be rushed" otherwise. Daughter states, "we haven't talked to a surgeon to know what the plan is" I attempted to explain that was the reason for going to [facility] ED as for the pt to see the surgeon and to further discuss the plan. Daughter states, "I don't want my mother transferred until we come up there"

## 2022-07-01 NOTE — H&P (Signed)
History and Physical    [name] QIO:[id number] DOB: Aug 03, 1923 DOA: 06/30/2022  PCP: Pcp, No (Confirm with patient/family/NH records and if not entered, this has to be entered at [facility] point of entry) Patient coming from: Home  I have personally briefly reviewed patient's old medical records in [place]  Chief Complaint: Feeling better  HPI: [name] is a 88 y.o. female with medical history significant of HTN, rectal cancer status post low anterior resection in 2008, chronic abdominal ventral hernia, presented with worsening of abdominal pain.  The patient has been at her usual status of health until about 1 week, she started to have intermittent cramping-like abdominal pain decreased oral intake appetite nauseous vomiting diarrhea initially, 1-2 times a day diarrhea, denies any tenesmus no fever or chills.  Vomiting has been intermittent associated with eating, continuing stomach content with occasional dark-colored.  Yesterday, nauseous vomiting abdominal pain became constant and stopped passing gas yesterday.  Meantime she has noticed a large bump in her lower abdomen, "must be the hernia".  Denies any chest pain no shortness of breath  ED Course: Afebrile, none tachycardia nonhypotensive nonhypoxic.  CT abdomen pelvis showed SBO.  Surgical consult required at [place] troponin elevated 315> 278  Review of Systems: As per HPI otherwise 14 point review of systems negative.    Past Medical History:  Diagnosis Date   Asthma    Blood in stool    Cancer ([place])    rectal ca no chemo/radiation   Chicken pox    History of colon polyps    History of rectal cancer     Past Surgical History:  Procedure Laterality Date   colorectal resection surgery  2008   [place]       reports that she has never smoked. She has never used smokeless tobacco. She reports that she does not drink alcohol and does not use drugs.  Allergies  Allergen Reactions    Levofloxacin     REACTION: Got very hot and turned red all over    Family History  Problem Relation Age of Onset   Cancer Other        colon   Heart disease Other      Prior to Admission medications   Medication Sig Start Date End Date Taking? Authorizing Provider  amLODipine (NORVASC) 2.5 MG tablet Take 1 tablet (2.5 mg total) by mouth daily. 10/16/20 11/15/20  Dorie Rank, MD  clindamycin (CLEOCIN) 300 MG capsule Take 1 capsule (300 mg total) by mouth 3 (three) times daily. 05/16/12   Lucretia Kern, DO  lidocaine (LIDODERM) 5 % Place 1 patch onto the skin daily. Remove & Discard patch within 12 hours or as directed by MD 10/16/20   Dorie Rank, MD  LOTEMAX 0.5 % GEL Place 1 drop into both eyes daily.  12/30/11   [provider]  penicillin v potassium (VEETID) 250 MG tablet Take 1 tablet (250 mg total) by mouth 4 (four) times daily. 05/28/12   Campbell Riches, MD  traMADol (ULTRAM) 50 MG tablet Take 0.5 tablets (25 mg total) by mouth every 8 (eight) hours as needed for severe pain. 10/16/20   Dorie Rank, MD  triamcinolone cream (KENALOG) 0.5 % Apply topically 2 (two) times daily. Apply to affected area of leg twice daily and cover with vasoline or eucerin 04/15/12   Lucretia Kern, DO    Physical Exam: Vitals:   07/01/22 0821 07/01/22 0900 07/01/22 1000 07/01/22 1154  BP:  118/63 (!) 119/54 (!) 140/67  Pulse:  84 90 95  Resp:  '19 20 17  '$ Temp: 98 F (36.7 C)   97.9 F (36.6 C)  TempSrc: Oral   Oral  SpO2:  93% 99% 95%  Weight:      Height:        Constitutional: NAD, calm, comfortable Vitals:   07/01/22 0821 07/01/22 0900 07/01/22 1000 07/01/22 1154  BP:  118/63 (!) 119/54 (!) 140/67  Pulse:  84 90 95  Resp:  '19 20 17  '$ Temp: 98 F (36.7 C)   97.9 F (36.6 C)  TempSrc: Oral   Oral  SpO2:  93% 99% 95%  Weight:      Height:       Eyes: PERRL, lids and conjunctivae normal ENMT: Mucous membranes are moist. Posterior pharynx clear of any exudate or lesions.Normal  dentition.  Neck: normal, supple, no masses, no thyromegaly Respiratory: clear to auscultation bilaterally, no wheezing, no crackles. Normal respiratory effort. No accessory muscle use.  Cardiovascular: Regular rate and rhythm, loud blowing like murmur systolic on heart base. No extremity edema. 2+ pedal pulses. No carotid bruits.  Abdomen: mild tenderness on periumbilical area, with decreased bowel sounds, hernia sac palpable lower abdominal/suprapubic area, reducible, with no significant tenderness n Musculoskeletal: no clubbing / cyanosis. No joint deformity upper and lower extremities. Good ROM, no contractures. Normal muscle tone.  Skin: no rashes, lesions, ulcers. No induration Neurologic: CN 2-12 grossly intact. Sensation intact, DTR normal. Strength 5/5 in all 4.  Psychiatric: Normal judgment and insight. Alert and oriented x 3. Normal mood.    Labs on Admission: I have personally reviewed following labs and imaging studies  CBC: Recent Labs  Lab 06/30/22 1159  WBC 8.4  HGB 13.2  HCT 41.0  MCV 93.6  PLT 160   Basic Metabolic Panel: Recent Labs  Lab 06/30/22 1159  NA 138  K 4.4  CL 98  CO2 27  GLUCOSE 104*  BUN 53*  CREATININE 1.16*  CALCIUM 9.3   GFR: Estimated Creatinine Clearance: 24.4 mL/min (A) (by C-G formula based on SCr of 1.16 mg/dL (H)). Liver Function Tests: Recent Labs  Lab 06/30/22 1159  AST 22  ALT 10  ALKPHOS 51  BILITOT 1.2  PROT 7.3  ALBUMIN 4.1   Recent Labs  Lab 06/30/22 1159  LIPASE 43   No results for input(s): "AMMONIA" in the last 168 hours. Coagulation Profile: No results for input(s): "INR", "PROTIME" in the last 168 hours. Cardiac Enzymes: No results for input(s): "CKTOTAL", "CKMB", "CKMBINDEX", "TROPONINI" in the last 168 hours. BNP (last 3 results) No results for input(s): "PROBNP" in the last 8760 hours. HbA1C: No results for input(s): "HGBA1C" in the last 72 hours. CBG: No results for input(s): "GLUCAP" in the last  168 hours. Lipid Profile: No results for input(s): "CHOL", "HDL", "LDLCALC", "TRIG", "CHOLHDL", "LDLDIRECT" in the last 72 hours. Thyroid Function Tests: No results for input(s): "TSH", "T4TOTAL", "FREET4", "T3FREE", "THYROIDAB" in the last 72 hours. Anemia Panel: No results for input(s): "VITAMINB12", "FOLATE", "FERRITIN", "TIBC", "IRON", "RETICCTPCT" in the last 72 hours. Urine analysis:    Component Value Date/Time   COLORURINE YELLOW 06/30/2022 2030   APPEARANCEUR CLEAR 06/30/2022 2030   LABSPEC >1.046 (H) 06/30/2022 2030   PHURINE 5.5 06/30/2022 2030   GLUCOSEU NEGATIVE 06/30/2022 2030   [place] NEGATIVE 06/30/2022 2030   [place] NEGATIVE 06/30/2022 2030   KETONESUR 40 (A) 06/30/2022 2030   PROTEINUR TRACE (A) 06/30/2022 2030   NITRITE  NEGATIVE 06/30/2022 2030   LEUKOCYTESUR MODERATE (A) 06/30/2022 2030    Radiological Exams on Admission: CT ABDOMEN PELVIS W CONTRAST  Result Date: 06/30/2022 CLINICAL DATA:  Nausea vomiting and diarrhea with pain to left groin area EXAM: CT ABDOMEN AND PELVIS WITH CONTRAST TECHNIQUE: Multidetector CT imaging of the abdomen and pelvis was performed using the standard protocol following bolus administration of intravenous contrast. RADIATION DOSE REDUCTION: This exam was performed according to the departmental dose-optimization program which includes automated exposure control, adjustment of the mA and/or kV according to patient size and/or use of iterative reconstruction technique. CONTRAST:  23m OMNIPAQUE IOHEXOL 300 MG/ML  SOLN COMPARISON:  CT abdomen and pelvis dated July 31, 2007 FINDINGS: Lower chest: Cardiomegaly.  Mitral annular calcifications. Hepatobiliary: No focal liver abnormality is seen. No gallstones, gallbladder wall thickening, or biliary dilatation. Pancreas: Unremarkable. No pancreatic ductal dilatation or surrounding inflammatory changes. Spleen: Normal in size without focal abnormality. Adrenals/Urinary Tract: Bilateral  adrenal glands are unremarkable. No hydronephrosis or nephrolithiasis. Bilateral low-attenuation renal lesions, largest are compatible with simple cysts, others are too small to completely characterize, no specific follow-up imaging is recommended for these lesions. Thick-walled and trabeculated urinary bladder. Stomach/Bowel: Multiple dilated loops of small bowel with transition point at the neck of a moderate sized left inguinal hernia. Decompressed distal small bowel loops are seen distal to the hernia sac. No evidence of bowel wall thickening or pneumatosis. Vascular/Lymphatic: Severe aortic atherosclerosis. No enlarged abdominal or pelvic lymph nodes. Reproductive: Multi fibroid uterus Other: No abdominopelvic ascites or intra-free air. Musculoskeletal: No acute or significant osseous findings. IMPRESSION: 1. Multiple dilated loops of small bowel with gradual transition to decompressed bowel at the neck of a moderate sized left inguinal hernia, findings are favored to be due to small-bowel obstruction secondary to hernia. 2. Thick-walled and trabeculated urinary bladder, suggestive of chronic bladder dysfunction. Recommend correlation with urinalysis to exclude infection. 3. Aortic Atherosclerosis (ICD10-I70.0). Electronically Signed   By: LYetta GlassmanM.D.   On: 06/30/2022 18:29    EKG: Independently reviewed.  Sinus rhythm, no acute ST changes.  Assessment/Plan Principal Problem:   SBO (small bowel obstruction) (HCC) Active Problems:   HTN (hypertension)  (please populate well all problems here in Problem List. (For example, if patient is on BP meds at home and you resume or decide to hold them, it is a problem that needs to be her. Same for CAD, COPD, HLD and so on)  SBO -Improving clinically, no indication for surgical correction/lysis at this point. -D/W on call surgeon, Dr. SThermon Leyland who is right now in OR, and plan to see the patient afterwards. -Still Weak and Patient Does Not  Remember Whether She Passed Out Overnight, Will Keep Her N.P.O., IV Fluid and Other Supportive Care Including Dilaudid and As Needed Zofran. -Discussed with patient's daughter/POA, all questions answered with my best knowledge.  Elevated troponins -No chest pains, no ST changes on EKG and elevation of troponin pattern is flat, arguing against ACS. -Clinically most likely demand ischemia from SBO, and also suspect patient has at least moderate AS, will trend Trop one more time and ordered Echo.  Expect conservative management given patient's age. -Start ASA and check lipid pannel  Mild protein calorie malnutrition -BMI=20, recommend increase calorie and protein intake.  DVT prophylaxis: Lovenox Code Status: Full code Family Communication: Daughter/POA over the phone Disposition Plan: Patient sick with SBO requiring inpatient surgical consultation and inpatient management, expect more than 2 midnight hospital stay Consults called: General surgery Admission  status: Tele admit   Lequita Halt MD [facility] Pager [phone]  07/01/2022, 2:29 PM

## 2022-07-01 NOTE — ED Notes (Signed)
Called [facility] and spoke to [place]; patient Bed Assignment is ready; not doing ED to ED tx.

## 2022-07-01 NOTE — Progress Notes (Signed)
I tried to get this patient transferred to the ER this morning to expedite her surgical evaluation, but family did not want her transferred till she had a bed.  I was alerted to her arrival at [facility] at 218 PM.  I was operating and came to the bedside [facility].  She is not here.  She left AMA.  She may be in need of urgent or emergent surgery but I am unable to help her because she is not here.  Felicie Morn, MD General, Bariatric and Minimally Invasive Surgery [place]

## 2022-07-01 NOTE — ED Notes (Signed)
She leaves with [facility] at this time. She remains alert and in no distress.

## 2022-07-01 NOTE — ED Notes (Signed)
I have just called report to [place] at [employer] at [place]. [facility] has just arrived for transport. I have called her daughter to inform her of rm. Number and impending transfer.

## 2022-07-01 NOTE — ED Notes (Addendum)
Pt's. Daughter has called to state that she does not want her mother (Pt.) to be transferred E.D. to E.D. Dr. Thermon Leyland is notified of this and is given her phone number [phone].

## 2022-07-01 NOTE — ED Notes (Signed)
She is awake, alert and in no distress. She has just been taken to b.r. and back per w/c by our tech.

## 2022-07-03 ENCOUNTER — Other Ambulatory Visit: Payer: Self-pay

## 2022-07-03 ENCOUNTER — Emergency Department (HOSPITAL_COMMUNITY): Payer: 59

## 2022-07-03 ENCOUNTER — Encounter (HOSPITAL_COMMUNITY): Payer: Self-pay | Admitting: *Deleted

## 2022-07-03 ENCOUNTER — Observation Stay (HOSPITAL_COMMUNITY)
Admission: EM | Admit: 2022-07-03 | Discharge: 2022-07-05 | Disposition: A | Discharge status: Home / Self Care | Payer: 59 | Attending: Internal Medicine | Admitting: Internal Medicine

## 2022-07-03 DIAGNOSIS — I350 Nonrheumatic aortic (valve) stenosis: Secondary | ICD-10-CM | POA: Insufficient documentation

## 2022-07-03 DIAGNOSIS — I2489 Other forms of acute ischemic heart disease: Secondary | ICD-10-CM | POA: Insufficient documentation

## 2022-07-03 DIAGNOSIS — I10 Essential (primary) hypertension: Secondary | ICD-10-CM | POA: Diagnosis not present

## 2022-07-03 DIAGNOSIS — R109 Unspecified abdominal pain: Principal | ICD-10-CM | POA: Insufficient documentation

## 2022-07-03 DIAGNOSIS — K5909 Other constipation: Secondary | ICD-10-CM | POA: Insufficient documentation

## 2022-07-03 DIAGNOSIS — K4090 Unilateral inguinal hernia, without obstruction or gangrene, not specified as recurrent: Secondary | ICD-10-CM | POA: Insufficient documentation

## 2022-07-03 DIAGNOSIS — M5416 Radiculopathy, lumbar region: Secondary | ICD-10-CM | POA: Insufficient documentation

## 2022-07-03 DIAGNOSIS — K56609 Unspecified intestinal obstruction, unspecified as to partial versus complete obstruction: Secondary | ICD-10-CM | POA: Diagnosis not present

## 2022-07-03 DIAGNOSIS — Z85048 Personal history of other malignant neoplasm of rectum, rectosigmoid junction, and anus: Secondary | ICD-10-CM | POA: Diagnosis present

## 2022-07-03 DIAGNOSIS — Z79899 Other long term (current) drug therapy: Secondary | ICD-10-CM | POA: Insufficient documentation

## 2022-07-03 DIAGNOSIS — J45909 Unspecified asthma, uncomplicated: Secondary | ICD-10-CM | POA: Diagnosis not present

## 2022-07-03 DIAGNOSIS — R627 Adult failure to thrive: Secondary | ICD-10-CM | POA: Insufficient documentation

## 2022-07-03 LAB — CBC WITH DIFFERENTIAL/PLATELET
Abs Immature Granulocytes: 0.03 10*3/uL (ref 0.00–0.07)
Basophils Absolute: 0.1 10*3/uL (ref 0.0–0.1)
Basophils Relative: 1 %
Eosinophils Absolute: 0.1 10*3/uL (ref 0.0–0.5)
Eosinophils Relative: 1 %
HCT: 40.3 % (ref 36.0–46.0)
Hemoglobin: 12.9 g/dL (ref 12.0–15.0)
Immature Granulocytes: 0 %
Lymphocytes Relative: 11 %
Lymphs Abs: 0.9 10*3/uL (ref 0.7–4.0)
MCH: 30.6 pg (ref 26.0–34.0)
MCHC: 32 g/dL (ref 30.0–36.0)
MCV: 95.5 fL (ref 80.0–100.0)
Monocytes Absolute: 0.6 10*3/uL (ref 0.1–1.0)
Monocytes Relative: 8 %
Neutro Abs: 6.3 10*3/uL (ref 1.7–7.7)
Neutrophils Relative %: 79 %
Platelets: 217 10*3/uL (ref 150–400)
RBC: 4.22 MIL/uL (ref 3.87–5.11)
RDW: 13.6 % (ref 11.5–15.5)
WBC: 8 10*3/uL (ref 4.0–10.5)
nRBC: 0 % (ref 0.0–0.2)

## 2022-07-03 LAB — COMPREHENSIVE METABOLIC PANEL
ALT: 13 U/L (ref 0–44)
AST: 22 U/L (ref 15–41)
Albumin: 3.6 g/dL (ref 3.5–5.0)
Alkaline Phosphatase: 55 U/L (ref 38–126)
Anion gap: 12 (ref 5–15)
BUN: 25 mg/dL — ABNORMAL HIGH (ref 8–23)
CO2: 26 mmol/L (ref 22–32)
Calcium: 9.2 mg/dL (ref 8.9–10.3)
Chloride: 102 mmol/L (ref 98–111)
Creatinine, Ser: 0.91 mg/dL (ref 0.44–1.00)
GFR, Estimated: 58 mL/min — ABNORMAL LOW (ref >60)
Glucose, Bld: 109 mg/dL — ABNORMAL HIGH (ref 70–99)
Potassium: 3.8 mmol/L (ref 3.5–5.1)
Sodium: 140 mmol/L (ref 135–145)
Total Bilirubin: 1.5 mg/dL — ABNORMAL HIGH (ref 0.3–1.2)
Total Protein: 7 g/dL (ref 6.5–8.1)

## 2022-07-03 LAB — LACTIC ACID, PLASMA: Lactic Acid, Venous: 1 mmol/L (ref 0.5–1.9)

## 2022-07-03 LAB — PROTIME-INR
INR: 1.1 (ref 0.8–1.2)
Prothrombin Time: 13.8 seconds (ref 11.4–15.2)

## 2022-07-03 MED ORDER — ONDANSETRON HCL 4 MG/2ML IJ SOLN
4.0000 mg | Freq: Once | INTRAMUSCULAR | Status: AC
  Administered 2022-07-03: 4 mg via INTRAVENOUS
  Filled 2022-07-03: qty 2

## 2022-07-03 MED ORDER — SODIUM CHLORIDE 0.9 % IV BOLUS
1000.0000 mL | Freq: Once | INTRAVENOUS | Status: AC
  Administered 2022-07-03: 1000 mL via INTRAVENOUS

## 2022-07-03 MED ORDER — MORPHINE SULFATE (PF) 2 MG/ML IV SOLN
2.0000 mg | Freq: Once | INTRAVENOUS | Status: AC
  Administered 2022-07-03: 2 mg via INTRAVENOUS
  Filled 2022-07-03: qty 1

## 2022-07-03 MED ORDER — IOHEXOL 300 MG/ML  SOLN
80.0000 mL | Freq: Once | INTRAMUSCULAR | Status: AC | PRN
  Administered 2022-07-03: 80 mL via INTRAVENOUS

## 2022-07-03 NOTE — ED Provider Notes (Signed)
[facility] DEPT Provider Note   CSN: [id number] Arrival date & time: 07/03/22  1157     History {Add pertinent medical, surgical, social history, OB history to HPI:1} No chief complaint on file.   [name] is a 88 y.o. female.  88 yo F with a chief complaints of abdominal pain inability to eat and diarrhea.  This has been going on for almost 2 weeks now.  The family says it has been about 11 days.  She is lost quite a bit of weight.  She was seen a few days ago and was diagnosed with a small bowel obstruction.  She unfortunately then left the hospital [place].  By the family's report they were told that they were not going to get surgery and that made the family frustrated because they thought that is what she required and so then they took her home.  They come back today with ongoing symptoms.  No fevers.  No dark or bloody stool.        Home Medications Prior to Admission medications   Medication Sig Start Date End Date Taking? Authorizing Provider  amLODipine (NORVASC) 2.5 MG tablet Take 1 tablet (2.5 mg total) by mouth daily. 10/16/20 11/15/20  Dorie Rank, MD  clindamycin (CLEOCIN) 300 MG capsule Take 1 capsule (300 mg total) by mouth 3 (three) times daily. 05/16/12   Lucretia Kern, DO  lidocaine (LIDODERM) 5 % Place 1 patch onto the skin daily. Remove & Discard patch within 12 hours or as directed by MD 10/16/20   Dorie Rank, MD  LOTEMAX 0.5 % GEL Place 1 drop into both eyes daily.  12/30/11   [provider]  penicillin v potassium (VEETID) 250 MG tablet Take 1 tablet (250 mg total) by mouth 4 (four) times daily. 05/28/12   Campbell Riches, MD  traMADol (ULTRAM) 50 MG tablet Take 0.5 tablets (25 mg total) by mouth every 8 (eight) hours as needed for severe pain. 10/16/20   Dorie Rank, MD  triamcinolone cream (KENALOG) 0.5 % Apply topically 2 (two) times daily. Apply to affected area of leg twice daily and cover with  vasoline or eucerin 04/15/12   Lucretia Kern, DO      Allergies    Levofloxacin    Review of Systems   Review of Systems  Physical Exam Updated Vital Signs BP (!) 153/79 (BP Location: Left Arm)   Pulse 97   Temp 98.5 F (36.9 C) (Oral)   Resp 16   Ht '5\' 4"'$  (1.626 m)   Wt 54.4 kg   SpO2 100%   BMI 20.60 kg/m  Physical Exam Vitals and nursing note reviewed.  Constitutional:      General: She is not in acute distress.    Appearance: She is well-developed. She is not diaphoretic.  HENT:     Head: Normocephalic and atraumatic.  Eyes:     Pupils: Pupils are equal, round, and reactive to light.  Cardiovascular:     Rate and Rhythm: Normal rate and regular rhythm.     Heart sounds: No murmur heard.    No friction rub. No gallop.  Pulmonary:     Effort: Pulmonary effort is normal.     Breath sounds: No wheezing or rales.  Abdominal:     General: There is no distension.     Palpations: Abdomen is soft.     Tenderness: There is no abdominal tenderness.     Comments: Mass to the  left lower abdominal region.  Easily reducible.  No obvious tenderness.  Musculoskeletal:        General: No tenderness.     Cervical back: Normal range of motion and neck supple.  Skin:    General: Skin is warm and dry.  Neurological:     Mental Status: She is alert and oriented to person, place, and time.  Psychiatric:        Behavior: Behavior normal.     ED Results / Procedures / Treatments   Labs (all labs ordered are listed, but only abnormal results are displayed) Labs Reviewed  COMPREHENSIVE METABOLIC PANEL - Abnormal; Notable for the following components:      Result Value   Glucose, Bld 109 (*)    BUN 25 (*)    Total Bilirubin 1.5 (*)    GFR, Estimated 58 (*)    All other components within normal limits  CBC WITH DIFFERENTIAL/PLATELET  PROTIME-INR  LACTIC ACID, PLASMA  URINALYSIS, ROUTINE W REFLEX MICROSCOPIC  LACTIC ACID, PLASMA    EKG EKG  Interpretation  Date/Time:  Tuesday July 03 2022 14:08:08 EST Ventricular Rate:  92 PR Interval:  149 QRS Duration: 81 QT Interval:  366 QTC Calculation: 453 R Axis:   -7 Text Interpretation: Sinus rhythm Ventricular premature complex Probable left atrial enlargement RSR' in V1 or V2, right VCD or RVH No significant change since last tracing Confirmed by Dorie Rank [phone]) on 07/03/2022 2:11:46 PM  Radiology No results found.  Procedures Procedures  {Document cardiac monitor, telemetry assessment procedure when appropriate:1}  Medications Ordered in ED Medications  morphine (PF) 2 MG/ML injection 2 mg (has no administration in time range)  ondansetron (ZOFRAN) injection 4 mg (has no administration in time range)  sodium chloride 0.9 % bolus 1,000 mL (has no administration in time range)    ED Course/ Medical Decision Making/ A&P                           Medical Decision Making Risk Prescription drug management.   88 yo F with a chief complaints of inability to eat.  This has been going on for about 11 days now.  She was seen in the emergency department setting about 48 hours ago was diagnosed with a small bowel obstruction thought to be due to an indirect left inguinal hernia.  She was sent to [facility] to be admitted and evaluated by general surgery.  Unfortunately she left prior to being seen by the surgeon.  It sounds like there may have been a miscommunication between the family and the hospital system.  The patient is well-appearing nontoxic.  She has no obvious focal abdominal tenderness for me.  She has an easily reducible left inguinal hernia.  She has no leukocytosis.  Is afebrile here.  Will repeat the CT scan.  Treat pain and nausea.  Bolus of IV fluids.  Reassess.  {Document critical care time when appropriate:1} {Document review of labs and clinical decision tools ie heart score, Chads2Vasc2 etc:1}  {Document your independent review of radiology images, and any  outside records:1} {Document your discussion with family members, caretakers, and with consultants:1} {Document social determinants of health affecting pt's care:1} {Document your decision making why or why not admission, treatments were needed:1} Final Clinical Impression(s) / ED Diagnoses Final diagnoses:  None    Rx / DC Orders ED Discharge Orders     None

## 2022-07-03 NOTE — ED Provider Notes (Signed)
  Provider Note MRN:  [id number]  Arrival date & time: 07/04/22    ED Course and Medical Decision Making  Assumed care from Dr. Tyrone Nine at shift change.  Recent admission for small bowel obstruction but then left the hospital, returning with continued abdominal pain, poor p.o. intake.  Repeating CT abdomen, suspect will need admission.  2 AM update: Repeat CT abdomen again demonstrating hernia but radiology not commenting on SBO or signs of incarceration.  Case discussed with Dr. Johney Maine of general surgery, general surgery will follow in consultation.  Given the patient is had very poor p.o. intake over the past 3 weeks we will admit to medicine.  Procedures  Final Clinical Impressions(s) / ED Diagnoses     ICD-10-CM   1. Unilateral inguinal hernia without obstruction or gangrene, recurrence not specified  K40.90     2. Failure to thrive in adult  R62.7     3. Small bowel obstruction (HCC)  K56.609 DG Abd Portable 1V-Small Bowel Obstruction Protocol-initial, 8 hr delay    DG Abd Portable 1V-Small Bowel Obstruction Protocol-initial, 8 hr delay      ED Discharge Orders     None       Discharge Instructions   None     Barth Kirks. [place]onia Small, [place] [email or web address]    Maudie Flakes, MD 07/04/22 [phone]

## 2022-07-03 NOTE — ED Provider Triage Note (Signed)
Emergency Medicine Provider Triage Evaluation Note  [name] , a 88 y.o. female  was evaluated in triage.  Pt complains of left lower quadrant abdominal pain and inability to eat for the past week.  Presented to [facility] for this on 06/30/2022 and was transferred to [facility] ED for surgical consult for low left inguinal hernia with small bowel obstruction.  She left with her family [place] and prior to surgery being able to assess her.  She states her daughter wanted to take her home for Christmas.  Symptoms have not changed.  Review of Systems  Positive: As above Negative: As above  Physical Exam  BP (!) 159/92   Pulse 94   Temp 98.2 F (36.8 C) (Oral)   Resp 16   Ht '5\' 4"'$  (1.626 m)   Wt 54.4 kg   SpO2 100%   BMI 20.60 kg/m  Gen:   Awake, no distress   Resp:  Normal effort  MSK:   Moves extremities without difficulty  Other:  Tenderness to palpation of the left lower quadrant  Medical Decision Making  Medically screening exam initiated at 1:41 PM.  Appropriate orders placed.  Zarahi Fuerst was informed that the remainder of the evaluation will be completed by another provider, this initial triage assessment does not replace that evaluation, and the importance of remaining in the ED until their evaluation is complete.  Repeat scan and labs today.  No open rooms for patient at the time of MSE   Roylene Reason, Hershal Coria 07/03/22 1348

## 2022-07-03 NOTE — ED Triage Notes (Signed)
BIB EMS seen at [facility] for hernia. Back today with lower left quad pain. No solid food x 10 days, no meds since Sunday, only fluids and jello. 162/90-78-16-98 CBG 153

## 2022-07-03 NOTE — ED Notes (Signed)
Labs/IV @ 1410

## 2022-07-04 ENCOUNTER — Observation Stay (HOSPITAL_COMMUNITY): Payer: 59

## 2022-07-04 ENCOUNTER — Encounter (HOSPITAL_COMMUNITY): Payer: Self-pay | Admitting: Surgery

## 2022-07-04 DIAGNOSIS — I10 Essential (primary) hypertension: Secondary | ICD-10-CM

## 2022-07-04 DIAGNOSIS — K4090 Unilateral inguinal hernia, without obstruction or gangrene, not specified as recurrent: Secondary | ICD-10-CM | POA: Diagnosis not present

## 2022-07-04 DIAGNOSIS — Z85048 Personal history of other malignant neoplasm of rectum, rectosigmoid junction, and anus: Secondary | ICD-10-CM | POA: Diagnosis not present

## 2022-07-04 DIAGNOSIS — R109 Unspecified abdominal pain: Secondary | ICD-10-CM | POA: Diagnosis present

## 2022-07-04 DIAGNOSIS — K5909 Other constipation: Secondary | ICD-10-CM | POA: Diagnosis present

## 2022-07-04 LAB — URINALYSIS, ROUTINE W REFLEX MICROSCOPIC
Bacteria, UA: NONE SEEN
Bilirubin Urine: NEGATIVE
Glucose, UA: NEGATIVE mg/dL
Hgb urine dipstick: NEGATIVE
Ketones, ur: 40 mg/dL — AB
Nitrite: NEGATIVE
Protein, ur: NEGATIVE mg/dL
Specific Gravity, Urine: 1.01 (ref 1.005–1.030)
pH: 6 (ref 5.0–8.0)

## 2022-07-04 MED ORDER — VITAMIN D 25 MCG (1000 UNIT) PO TABS
1000.0000 [IU] | ORAL_TABLET | Freq: Every day | ORAL | Status: DC
  Administered 2022-07-04 – 2022-07-05 (×2): 1000 [IU] via ORAL
  Filled 2022-07-04 (×2): qty 1

## 2022-07-04 MED ORDER — SODIUM CHLORIDE 0.9 % IV SOLN: 8.0000 mg | Freq: Four times a day (QID) | INTRAVENOUS | Status: DC | PRN

## 2022-07-04 MED ORDER — BISACODYL 10 MG RE SUPP
10.0000 mg | Freq: Every day | RECTAL | Status: DC
  Administered 2022-07-04: 10 mg via RECTAL
  Filled 2022-07-04 (×2): qty 1

## 2022-07-04 MED ORDER — PROCHLORPERAZINE EDISYLATE 10 MG/2ML IJ SOLN: 5.0000 mg | INTRAMUSCULAR | Status: DC | PRN

## 2022-07-04 MED ORDER — AMLODIPINE BESYLATE 5 MG PO TABS
2.5000 mg | ORAL_TABLET | Freq: Every day | ORAL | Status: DC
  Administered 2022-07-04 – 2022-07-05 (×2): 2.5 mg via ORAL
  Filled 2022-07-04 (×2): qty 1

## 2022-07-04 MED ORDER — MENTHOL 3 MG MT LOZG: 1.0000 | LOZENGE | OROMUCOSAL | Status: DC | PRN

## 2022-07-04 MED ORDER — ACETAMINOPHEN 650 MG RE SUPP: 650.0000 mg | Freq: Four times a day (QID) | RECTAL | Status: DC | PRN

## 2022-07-04 MED ORDER — SIMETHICONE 40 MG/0.6ML PO SUSP: 80.0000 mg | Freq: Four times a day (QID) | ORAL | Status: DC | PRN

## 2022-07-04 MED ORDER — ALUM & MAG HYDROXIDE-SIMETH 200-200-20 MG/5ML PO SUSP: 30.0000 mL | Freq: Four times a day (QID) | ORAL | Status: DC | PRN

## 2022-07-04 MED ORDER — MAGIC MOUTHWASH: 15.0000 mL | Freq: Four times a day (QID) | ORAL | Status: DC | PRN

## 2022-07-04 MED ORDER — PHENOL 1.4 % MT LIQD: 2.0000 | OROMUCOSAL | Status: DC | PRN

## 2022-07-04 MED ORDER — HYDROCHLOROTHIAZIDE 12.5 MG PO TABS
12.5000 mg | ORAL_TABLET | Freq: Every day | ORAL | Status: DC
  Administered 2022-07-04 – 2022-07-05 (×2): 12.5 mg via ORAL
  Filled 2022-07-04 (×2): qty 1

## 2022-07-04 MED ORDER — DIATRIZOATE MEGLUMINE & SODIUM 66-10 % PO SOLN: 90.0000 mL | Freq: Once | ORAL | Status: DC

## 2022-07-04 MED ORDER — LIP MEDEX EX OINT
TOPICAL_OINTMENT | Freq: Two times a day (BID) | CUTANEOUS | Status: DC
  Administered 2022-07-04 (×2): 75 via TOPICAL
  Filled 2022-07-04 (×2): qty 7

## 2022-07-04 MED ORDER — LACTATED RINGERS IV BOLUS: 1000.0000 mL | Freq: Three times a day (TID) | INTRAVENOUS | Status: DC | PRN

## 2022-07-04 MED ORDER — LACTATED RINGERS IV SOLN: INTRAVENOUS | Status: DC

## 2022-07-04 MED ORDER — DIATRIZOATE MEGLUMINE & SODIUM 66-10 % PO SOLN
90.0000 mL | Freq: Once | ORAL | Status: AC
  Administered 2022-07-04: 90 mL via ORAL
  Filled 2022-07-04: qty 90

## 2022-07-04 MED ORDER — POLYETHYLENE GLYCOL 3350 17 G PO PACK: 17.0000 g | PACK | Freq: Two times a day (BID) | ORAL | Status: DC

## 2022-07-04 MED ORDER — ONDANSETRON HCL 4 MG/2ML IJ SOLN: 4.0000 mg | Freq: Four times a day (QID) | INTRAMUSCULAR | Status: DC | PRN

## 2022-07-04 NOTE — ED Notes (Signed)
Pt ambulatory to restroom with staff assist.

## 2022-07-04 NOTE — Assessment & Plan Note (Signed)
Continue home amlodipine and HCTZ

## 2022-07-04 NOTE — H&P (Signed)
History and Physical    Patient: [name] MVE:[id number] DOB: August 24, 1923 DOA: 07/03/2022 DOS: the patient was seen and examined on 07/04/2022 PCP: Mckinley Jewel, MD  Patient coming from: Home  Chief Complaint:  Chief Complaint  Patient presents with   Abdominal Pain   HPI: [name] is a 88 y.o. female with medical history significant of rectal cancer status post resection in 2008, hypertension, chronic constipation, lumbar idiopathy, hiatal hernia who presented from home overnight with ongoing and worsening abdominal pain.  She reported associated nausea, single episode of nonbloody nonbilious emesis, constipation with no bowel movement in over a week.  Family report very poor p.o. intake for the past 11 days, few days ago she started having a little Jell-O and broth at times but still not eating much.  They expressed concern for dehydration.  She was seen by surgery overnight and started on aggressive bowel regimen, today having multiple episodes of diarrhea.  ED course --afebrile, stable vitals but blood pressure elevated at times with systolic ranging 283M to 629. Labs --CMP normal except for glucose 109, BUN 25, total bili 1.5.  Lactic acid normal 1.0.  CBC unremarkable.  Urinalysis negative for any signs of infection. Imaging --CT abdomen pelvis with contrast was repeated and showed no acute intra-abdominal findings that would explain her symptoms.  Moderate left inguinal hernia containing unremarkable loops of mid jejunum without evidence of obstruction or inflammation to suggest incarceration, cholelithiasis, mild bladder wall thickening.  Patient is admitted for observation with general surgery consulted.   Review of Systems: As mentioned in the history of present illness. All other systems reviewed and are negative.   Past Medical History:  Diagnosis Date   Asthma    Cancer ([place])    rectal ca no chemo/radiation   Chicken pox    History of colon polyps    Rectal  cancer ([place]) 01/15/2007   LAR resection with low anastomosis - Dr Georgette Dover.  pT3pN0 = Stage II   Past Surgical History:  Procedure Laterality Date   LOW ANTERIOR BOWEL RESECTION  01/15/2007   Dr Georgette Dover - 29EEA anastomsis 3cm from anal verge - pT3pN0 cancer   TONSILLECTOMY AND ADENOIDECTOMY     Social History:  reports that she has never smoked. She has never used smokeless tobacco. She reports that she does not drink alcohol and does not use drugs.  Allergies  Allergen Reactions   Levofloxacin     REACTION: Got very hot and turned red all over    Family History  Problem Relation Age of Onset   Cancer Other        colon   Heart disease Other     Prior to Admission medications   Medication Sig Start Date End Date Taking? Authorizing Provider  cholecalciferol (VITAMIN D3) 25 MCG (1000 UNIT) tablet Take 1,000 Units by mouth daily.   Yes [provider]  hydrochlorothiazide (MICROZIDE) 12.5 MG capsule TAKE 1 CAPSULE BY MOUTH EVERY DAY IN THE MORNING FOR 90 DAYS   Yes [provider]  amLODipine (NORVASC) 2.5 MG tablet Take 1 tablet (2.5 mg total) by mouth daily. 10/16/20 11/15/20  Dorie Rank, MD  clindamycin (CLEOCIN) 300 MG capsule Take 1 capsule (300 mg total) by mouth 3 (three) times daily. Patient not taking: Reported on 07/04/2022 05/16/12   Colin Benton R, DO  lidocaine (LIDODERM) 5 % Place 1 patch onto the skin daily. Remove & Discard patch within 12 hours or as directed by MD Patient not taking:  Reported on 07/04/2022 10/16/20   Dorie Rank, MD  penicillin v potassium (VEETID) 250 MG tablet Take 1 tablet (250 mg total) by mouth 4 (four) times daily. Patient not taking: Reported on 07/04/2022 05/28/12   Campbell Riches, MD  traMADol (ULTRAM) 50 MG tablet Take 0.5 tablets (25 mg total) by mouth every 8 (eight) hours as needed for severe pain. Patient not taking: Reported on 07/04/2022 10/16/20   Dorie Rank, MD  triamcinolone cream (KENALOG) 0.5 % Apply topically 2  (two) times daily. Apply to affected area of leg twice daily and cover with vasoline or eucerin Patient not taking: Reported on 07/04/2022 04/15/12   Lucretia Kern, DO    Physical Exam: Vitals:   07/04/22 0930 07/04/22 0945 07/04/22 0951 07/04/22 1155  BP: (!) 142/80   (!) 181/98  Pulse: 85  94   Resp: 15 19 (!) 22 18  Temp:   97.8 F (36.6 C) 98.6 F (37 C)  TempSrc:   Oral Oral  SpO2: 95%  (!) 78% 98%  Weight:      Height:       General exam: awake, alert, no acute distress HEENT: moist mucus membranes, hearing grossly normal  Respiratory system: CTAB, no wheezes, rales or rhonchi, normal respiratory effort. Cardiovascular system: normal S1/S2, RRR, no JVD, murmurs, rubs, gallops, no pedal edema.   Gastrointestinal system: soft, NT, ND, no HSM felt, +bowel sounds. Central nervous system: A&O x3. no gross focal neurologic deficits, normal speech Extremities: moves all, no edema, normal tone Skin: dry, intact, normal temperature, face is flushed Psychiatry: normal mood, congruent affect  Data Reviewed:  Notable labs and imaging as outlined above in HPI  Assessment and Plan: * Abdominal pain Suspect patient's abdominal pain from severe constipation given no BM in over a week.  She reports pain to be improved since having bowel movements but continues to have cramping pain prior to BMs. --General surgery following --Continue bowel regimen per orders --Monitor abdominal exam and bowel function closely --Clear liquid diet for now, advancement per surgery  Constipation, chronic On admission, patient and family reported no BM in over a week.  Resolving with aggressive bowel regimen.  Having multiple episodes diarrhea today.  Inguinal hernia, left - General surgery consulted for consideration of hernia repair surgery.  No bowel obstruction or signs of incarceration at this point, hernia was reportedly reducible in the ED. Follow-up general surgery recommendations.  They are  considering taking patient to the OR possibly tomorrow but ongoing discussions with patient and family at this time.  Personal history of pT3pN0 rectal cancer s/p LAR resection 2008 No acute issues  Lumbar radiculopathy No acute issues.  Consider PT evaluation if mobility concerns.  HTN (hypertension) Continue home amlodipine and HCTZ      Advance Care Planning: CODE STATUS: Full code Discussed at bedside with patient and family.  Consults: General surgery  Family Communication: Daughter is at bedside during admission encounter  Severity of Illness: The appropriate patient status for this patient is OBSERVATION. Observation status is judged to be reasonable and necessary in order to provide the required intensity of service to ensure the patient's safety. The patient's presenting symptoms, physical exam findings, and initial radiographic and laboratory data in the context of their medical condition is felt to place them at decreased risk for further clinical deterioration. Furthermore, it is anticipated that the patient will be medically stable for discharge from the hospital within 2 midnights of admission.   Author: Floyce Stakes  Arbutus Ped, DO 07/04/2022 3:44 PM  For on call review [email or web address].

## 2022-07-04 NOTE — Consult Note (Addendum)
[name]  06-05-1924 [id number]  CARE TEAM:  PCP: Julie Jewel, MD  Outpatient Care Team: Patient Care Team: Julie Jewel, MD as PCP - General (Internal Medicine) Julie Mesa, MD as Consulting Physician (General Surgery)  Inpatient Treatment Team: Treatment Team: Attending Provider: Maudie Flakes, MD; Paramedic: [name], Paramedic; Registered Nurse: Julie Alvine, RN; Technician: [name], EMT; Technician: [name], EMT; Rounding Team: Julie Knife, MD; Consulting Physician: Julie Pace, Md, MD   This patient is a 88 y.o.female who presents today for surgical evaluation at the request of Dr [name]. [facility] ED  Chief complaint / Reason for evaluation: Abdominal pain with reducible hernia  88 year old female.  She is by herself in the ED room.   Patient has had abdominal pain with decreased appetite, nausea.  Episode of vomiting.  And constipation.  She thinks it has been going on for the past 10 days.  Was concerning.  Went to [facility] on Christmas Eve 12/24.  CT scan noted hernia in left lower quadrant.  Recommendation made for transfer to a hospital where they could be evaluated.  This took some time.  Eventually went to [facility].  Left inguinal hernia noted.  There was concern that the hernia could have been a transition point on CAT scan.  However it was reducible in the ER department.  Dr. Thermon Faulkner he with our group went to find the patient after he had done surgery urgently on someone else.  They had left.  He recalls being told that family wanted the patient home over Christmas.  Apparently patient having persistent complaints.  Patient and family concurred.  This time they came to [facility] emergency department.  Again reducible left inguinal hernia found.  Repeat CT scan done.  Again hernia noted but no obstruction.  Reducible in the ER again today as well.  Family hoping surgery could be done.  Medicine admitting  but wanted surgery to evaluate.  Patient in ER bed 21.  She is calm and relaxed.  Sitting up.  Relayed information above.  No other family available.  She is calm.  She is not agitated.  History of rectal cancer status post open low anterior rectosigmoid resection by Dr. Georgette Faulkner in our group in 2008 with a low anastomosis at 3 cm.  Looks like it was pT3 pN0 stage II.  Dr. Ralene Faulkner with medical oncology may have been involved with her but records are not available.  She denies getting any chemotherapy.  She tells me that she had not had a bowel movement over a week.  Finally had a Faulkner 1 in the ER today.  She does not think she is usually constipated.   Assessment  [name]  88 y.o. female       Problem List:  Principal Problem:   Abdominal pain Active Problems:   Personal history of pT3pN0 rectal cancer s/p LAR resection 2008   Inguinal hernia, left -   Constipation, chronic   Decreased appetite with intermittent abdominal pain and nausea.  Left lower quadrant soft reducible inguinal hernia.    Plan:  While she does have a left inguinal hernia, it is soft and reducible.  There is no point of obstruction.  It is not tender.  It is not strangulated or incarcerated.  This seems to have been the case 3 days ago as well.  It is not a point of obstruction on tonight's CAT scan either.  There is no  evidence of any appendicitis (appendix not easily seen but no inflammation or phlegmon there), bowel obstruction, diverticulitis, closed-loop obstruction, cholecystitis, enteritis, perforation, etc.  Her Faulkner bowel mesentery does have some occasional poorly but nothing concerning for volvulus.  There is no indication for emergency operative exploration in the middle of the night  May be reasonable to do the Faulkner bowel protocol and have her drink some contrast and do x-rays.  Avoid the NG tube.  If contrast gets into the colon, that is reassuring as well.  She does have an inguinal hernia.   Usually we consider repairing these electively, especially if there is bowel involved.  However she is 88 years old and her risks are increased.  It would be wise to get a sense of medical evaluation and clearance.  Will defer to my [facility] partners coming in the morning to determine if they feel it is a reasonable risk to proceed with hernia repair this hospitalization versus close outpatient planning to prevent possible future obstruction event..  She does not need emergency surgery tonight.  I am skeptical that her left inguinal hernia that is soft, nontender, nonobstructing and reducible is the source of her complaints of abdominal pain nausea and vomiting.  I think the patient would like her family involved for that discussion as well and they are not available right now.  History of rectal cancer rather bulky requiring open low anterior resection with low stapled EEA anastomosis in 2008.  Stage II.  Sounds like no radiation or chemotherapy was given.  No evidence of local recurrence by colonoscopy in 2009.  No repeat colonoscopy in records.  Digital exam without any strong evidence of any local recurrence.  Certainly no stricturing there.  CT scan shows no obvious metastatic disease in abdomen or pelvis.  If there is no obstruction, consider more aggressive bowel regimen such as [name] twice a day.  Patient has gallstones but there is no evidence of cholecystitis or elevated LFTs.  Biliary colic could be in the differential but she does not really have any pain or discomfort there.  Apparently has some bladder wall thickening as well.  Checking urinalysis and urine culture.  Defer to medicine or urology but urinalysis with argues against any major infection.    Complaining of hiccups and belching.  Do not know if she would benefit from GI evaluation to rule out heartburn or reflux issues or delayed gastric emptying.  She does not have a giant hiatal hernia that raises concern.  No stomach thickening or  perforated ulcer.  I can feel her low EEA anastomosis and there is no evidence of any local rectal cancer recurrence there.  She is 15 years out from resection of her stage II rectal cancer.  -VTE prophylaxis- SCDs, etc  -mobilize as tolerated to help recovery  I reviewed nursing notes, ED provider notes, hospitalist notes, last 24 h vitals and pain scores, last 48 h intake and output, last 24 h labs and trends, and last 24 h imaging results. I have reviewed this patient's available data, including medical history, events of note, test results, etc as part of my evaluation.  A significant portion of that time was spent in counseling.  Care during the described time interval was provided by me.  This care required moderate level of medical decision making.  07/04/2022  Adin Hector, MD, FACS, MASCRS Esophageal, Gastrointestinal & Colorectal Surgery Robotic and Minimally Invasive Surgery  [facility] [address] [place], [place] [address] [phone] Fax [phone] Main  CONTACT INFORMATION:  Weekday (9AM-5PM): Call [facility] main office at [phone]  Weeknight (5PM-9AM) or Weekend/Holiday: Check [email or web address] (password " [email or web address]") for General Surgery [facility] coverage  (Please, do not use SecureChat as it is not reliable communication to reach operating surgeons for immediate patient care given surgeries/outpatient duties/clinic/cross-coverage/off post-call which would lead to a delay in care.  Epic staff messaging available for outptient concerns, but may not be answered for 48 hours or more).     07/04/2022      Past Medical History:  Diagnosis Date   Asthma    Cancer ([place])    rectal ca no chemo/radiation   Chicken pox    History of colon polyps    Rectal cancer ([place]) 01/15/2007   LAR resection with low anastomosis - Dr [name].  pT3pN0 = Stage II    Past Surgical History:  Procedure Laterality Date    LOW ANTERIOR BOWEL RESECTION  01/15/2007   Dr [name] - 29EEA anastomsis 3cm from anal verge - pT3pN0 cancer   TONSILLECTOMY AND ADENOIDECTOMY      Social History   Socioeconomic History   Marital status: Widowed    Spouse name: Not on file   Number of children: Not on file   Years of education: Not on file   Highest education level: Not on file  Occupational History   Not on file  Tobacco Use   Smoking status: Never   Smokeless tobacco: Never  Substance and Sexual Activity   Alcohol use: No   Drug use: No   Sexual activity: Never  Other Topics Concern   Not on file  Social History Narrative   Not on file   Social Determinants of Health   Financial Resource Strain: Not on file  Food Insecurity: No Food Insecurity (07/01/2022)   Hunger Vital Sign    Worried About Running Out of Food in the Last Year: Never true    Ran Out of Food in the Last Year: Never true  Transportation Needs: No Transportation Needs (07/01/2022)   PRAPARE - Hydrologist (Medical): No    Lack of Transportation (Non-Medical): No  Physical Activity: Not on file  Stress: Not on file  Social Connections: Not on file  Intimate Partner Violence: Not At Risk (07/01/2022)   Humiliation, Afraid, Rape, and Kick questionnaire    Fear of Current or Ex-Partner: No    Emotionally Abused: No    Physically Abused: No    Sexually Abused: No    Family History  Problem Relation Age of Onset   Cancer Other        colon   Heart disease Other     Current Facility-Administered Medications  Medication Dose Route Frequency Provider Last Rate Last Admin   acetaminophen (TYLENOL) suppository 650 mg  650 mg Rectal Q6H PRN Michael Boston, MD       alum & mag hydroxide-simeth (MAALOX/MYLANTA) 200-200-20 MG/5ML suspension 30 mL  30 mL Oral Q6H PRN Michael Boston, MD       bisacodyl (DULCOLAX) suppository 10 mg  10 mg Rectal Daily Michael Boston, MD       diatrizoate meglumine-sodium  (GASTROGRAFIN) 66-10 % solution 90 mL  90 mL Per NG tube Once [place]ath Madding, Remo Lipps, MD       lactated ringers bolus 1,000 mL  1,000 mL Intravenous Q8H PRN Michael Boston, MD       lip balm (CARMEX)  ointment   Topical BID Michael Boston, MD       magic mouthwash  15 mL Oral QID PRN Michael Boston, MD       menthol-cetylpyridinium (CEPACOL) lozenge 3 mg  1 lozenge Oral PRN Michael Boston, MD       ondansetron [facility]) injection 4 mg  4 mg Intravenous Q6H PRN Michael Boston, MD       Or   ondansetron (ZOFRAN) 8 mg in sodium chloride 0.9 % 50 mL IVPB  8 mg Intravenous Q6H PRN Michael Boston, MD       phenol (CHLORASEPTIC) mouth spray 2 spray  2 spray Mouth/Throat PRN Michael Boston, MD       prochlorperazine (COMPAZINE) injection 5-10 mg  5-10 mg Intravenous Q4H PRN Michael Boston, MD       simethicone (MYLICON) 40 BJ/6.2GB suspension 80 mg  80 mg Oral QID PRN Michael Boston, MD       Current Outpatient Medications  Medication Sig Dispense Refill   amLODipine (NORVASC) 2.5 MG tablet Take 1 tablet (2.5 mg total) by mouth daily. 30 tablet 0   clindamycin (CLEOCIN) 300 MG capsule Take 1 capsule (300 mg total) by mouth 3 (three) times daily. 21 capsule 0   lidocaine (LIDODERM) 5 % Place 1 patch onto the skin daily. Remove & Discard patch within 12 hours or as directed by MD 10 patch 0   LOTEMAX 0.5 % GEL Place 1 drop into both eyes daily.      penicillin v potassium (VEETID) 250 MG tablet Take 1 tablet (250 mg total) by mouth 4 (four) times daily. 30 tablet 5   traMADol (ULTRAM) 50 MG tablet Take 0.5 tablets (25 mg total) by mouth every 8 (eight) hours as needed for severe pain. 15 tablet 0   triamcinolone cream (KENALOG) 0.5 % Apply topically 2 (two) times daily. Apply to affected area of leg twice daily and cover with vasoline or eucerin 30 g 0     Allergies  Allergen Reactions   Levofloxacin     REACTION: Got very hot and turned red all over    ROS:   All other systems reviewed & are negative except per HPI  or as noted below: Constitutional:  No fevers, chills, sweats.  Weight stable Eyes:  No vision changes, No discharge HENT:  No sore throats, nasal drainage Lymph: No neck swelling, No bruising easily Pulmonary:  No cough, productive sputum CV: No orthopnea, PND  Patient walks 1/4 mile without difficulty.  No exertional chest/neck/shoulder/arm pain.  GI:  No personal nor family history of inflammatory bowel disease, irritable bowel syndrome, allergy such as Celiac Sprue, dietary/dairy problems, colitis, ulcers nor gastritis.  No recent sick contacts/gastroenteritis.  No travel outside the country.  No changes in diet.  Renal: No UTIs, No hematuria Genital:  No drainage, bleeding, masses Musculoskeletal: No severe joint pain.  Good ROM major joints Skin:  No sores or lesions Heme/Lymph:  No easy bleeding.  No swollen lymph nodes   BP 135/68   Pulse 87   Temp 97.6 F (36.4 C) (Oral)   Resp 18   Ht '5\' 4"'$  (1.626 m)   Wt 54.4 kg   SpO2 91%   BMI 20.60 kg/m   Physical Exam:  Constitutional: Not cachectic.  Hygeine adequate.  Vitals signs as above.   Eyes: Pupils reactive, normal extraocular movements. Sclera nonicteric Neuro: CN II-XII intact.  No major focal sensory defects.  No major motor deficits.   Lymph: No head/neck/groin  lymphadenopathy Psych:  No severe agitation.  No severe anxiety.  Judgment & insight Adequate, Oriented x4, memory recall pretty good but a little vague on some historical events. HENT: Normocephalic, Mucus membranes moist.  No thrush.   Neck: Supple, No tracheal deviation.  No obvious thyromegaly Chest: No pain to chest wall compression.  Good respiratory excursion.  No audible wheezing CV:  Pulses intact.  regular rhythm.  No major extremity edema  Abdomen:  Flat Hernia: Not present. Diastasis recti: Mild supraumbilical midline. Soft.   Mildly distended.  Mild discomfort but no guarding or peritonitis..  No hepatomegaly.  No splenomegaly.  No tenderness  over McBurney's point.  No Murphy sign no guarding.  No obvious psoas sign  Gen:  Inguinal hernia: Left groin bulge 6 x 6 cm reduces down easily without pain or discomfort to a Faulkner LIH hernia defect.  Right side with Valsalva giving mild impulse - question of Faulkner knuckle of fat on CAT scan..  Inguinal lymph nodes: without lymphadenopathy.    Rectal: Given her history of rectal cancer and constipation I recommended a digital rectal exam.  She consented.  Perianal skin clear.  No obvious fissure nor abscess.  Intact sphincter tone.  I can palpate a fascicular EEA anastomosis 3-4 cm from the anal verge.  It is smooth.  I palpate no obvious stricture or narrowing or mass in the region.  There is a large volume of thick soft stool in the rectum.  Ext: No obvious deformity or contracture.  Edema: Not present.  No cyanosis Skin: No major subcutaneous nodules.  Warm and dry Musculoskeletal: Severe joint rigidity not present.  No obvious clubbing.  No digital petechiae.     Results:   Labs: Results for orders placed or performed during the hospital encounter of 07/03/22 (from the past 48 hour(s))  CBC with Differential     Status: None   Collection Time: 07/03/22  2:01 PM  Result Value Ref Range   WBC 8.0 4.0 - 10.5 K/uL   RBC 4.22 3.87 - 5.11 MIL/uL   Hemoglobin 12.9 12.0 - 15.0 g/dL   HCT 40.3 36.0 - 46.0 %   MCV 95.5 80.0 - 100.0 fL   MCH 30.6 26.0 - 34.0 pg   MCHC 32.0 30.0 - 36.0 g/dL   RDW 13.6 11.5 - 15.5 %   Platelets 217 150 - 400 K/uL   nRBC 0.0 0.0 - 0.2 %   Neutrophils Relative % 79 %   Neutro Abs 6.3 1.7 - 7.7 K/uL   Lymphocytes Relative 11 %   Lymphs Abs 0.9 0.7 - 4.0 K/uL   Monocytes Relative 8 %   Monocytes Absolute 0.6 0.1 - 1.0 K/uL   Eosinophils Relative 1 %   Eosinophils Absolute 0.1 0.0 - 0.5 K/uL   Basophils Relative 1 %   Basophils Absolute 0.1 0.0 - 0.1 K/uL   Immature Granulocytes 0 %   Abs Immature Granulocytes 0.03 0.00 - 0.07 K/uL    Comment: Performed  at [facility], [place] [address], [place], [place] [address]  Protime-INR     Status: None   Collection Time: 07/03/22  2:01 PM  Result Value Ref Range   Prothrombin Time 13.8 11.4 - 15.2 seconds   INR 1.1 0.8 - 1.2    Comment: (NOTE) INR goal varies based on device and disease states. Performed at [facility], [place] [address], [place], [place] [address]   Lactic acid, plasma  Status: None   Collection Time: 07/03/22  2:01 PM  Result Value Ref Range   Lactic Acid, Venous 1.0 0.5 - 1.9 mmol/L    Comment: Performed at [facility], [place] [address], [place], [place] [address]  Comprehensive metabolic panel     Status: Abnormal   Collection Time: 07/03/22  2:01 PM  Result Value Ref Range   Sodium 140 135 - 145 mmol/L   Potassium 3.8 3.5 - 5.1 mmol/L   Chloride 102 98 - 111 mmol/L   CO2 26 22 - 32 mmol/L   Glucose, Bld 109 (H) 70 - 99 mg/dL    Comment: Glucose reference range applies only to samples taken after fasting for at least 8 hours.   BUN 25 (H) 8 - 23 mg/dL   Creatinine, Ser 0.91 0.44 - 1.00 mg/dL   Calcium 9.2 8.9 - 10.3 mg/dL   Total Protein 7.0 6.5 - 8.1 g/dL   Albumin 3.6 3.5 - 5.0 g/dL   AST 22 15 - 41 U/L   ALT 13 0 - 44 U/L   Alkaline Phosphatase 55 38 - 126 U/L   Total Bilirubin 1.5 (H) 0.3 - 1.2 mg/dL   GFR, Estimated 58 (L) >60 mL/min    Comment: (NOTE) Calculated using the CKD-EPI Creatinine Equation (2021)    Anion gap 12 5 - 15    Comment: Performed at [facility], [place] [address], [place], [place] [address]  Urinalysis, Routine w reflex microscopic Urine, Clean Catch     Status: Abnormal   Collection Time: 07/04/22  1:33 AM  Result Value Ref Range   Color, Urine YELLOW YELLOW   APPearance CLEAR CLEAR   Specific Gravity, Urine 1.010 1.005 - 1.030   pH 6.0 5.0 - 8.0   Glucose, UA NEGATIVE NEGATIVE mg/dL   Hgb urine dipstick NEGATIVE NEGATIVE   Bilirubin Urine NEGATIVE NEGATIVE    Ketones, ur 40 (A) NEGATIVE mg/dL   Protein, ur NEGATIVE NEGATIVE mg/dL   Nitrite NEGATIVE NEGATIVE   Leukocytes,Ua TRACE (A) NEGATIVE   RBC / HPF 0-5 0 - 5 RBC/hpf   WBC, UA 0-5 0 - 5 WBC/hpf   Bacteria, UA NONE SEEN NONE SEEN   Squamous Epithelial / LPF 0-5 0 - 5 /HPF   Mucus PRESENT    Hyaline Casts, UA PRESENT     Comment: Performed at [facility], [place] [address], [place], [place] [address]    Imaging / Studies: CT Abdomen Pelvis W Contrast  Result Date: 07/03/2022 CLINICAL DATA:  Bowel obstruction suspected. Left lower quadrant abdominal pain EXAM: CT ABDOMEN AND PELVIS WITH CONTRAST TECHNIQUE: Multidetector CT imaging of the abdomen and pelvis was performed using the standard protocol following bolus administration of intravenous contrast. RADIATION DOSE REDUCTION: This exam was performed according to the departmental dose-optimization program which includes automated exposure control, adjustment of the mA and/or kV according to patient size and/or use of iterative reconstruction technique. CONTRAST:  57m OMNIPAQUE IOHEXOL 300 MG/ML  SOLN COMPARISON:  None Available. FINDINGS: Lower chest: No acute abnormality. Cardiac size is mildly enlarged. The descending thoracic aorta is dilated measuring 3.3 cm in diameter, stable since prior exam. Hepatobiliary: Cholelithiasis without pericholecystic inflammatory change. Liver unremarkable. No intra or extrahepatic biliary ductal dilation. Pancreas: Unremarkable Spleen: Unremarkable Adrenals/Urinary Tract: The adrenal glands are unremarkable. Multiple simple cortical cysts are seen within the left kidney, benign. No follow-up imaging is recommended for these lesions. The kidneys are otherwise unremarkable. The bladder is mildly  thick walled, similar prior examination and remote prior examination of 01/02/2007, suggesting changes of chronic bladder outlet obstruction. Stomach/Bowel: Moderate left inguinal hernia is present  containing several unremarkable loops of mid jejunum. The stomach, Faulkner bowel, and large bowel are otherwise unremarkable. No evidence of obstruction or focal inflammation. The appendix is not clearly identified and may be absent. No free intraperitoneal gas or fluid. Vascular/Lymphatic: Extensive aortoiliac atherosclerotic calcification. No aortic aneurysm. No pathologic adenopathy within the abdomen and pelvis. Reproductive: Involuted calcified fibroid within the uterus. The pelvic organs are otherwise unremarkable. Other: Faulkner fat containing right inguinal hernia. Musculoskeletal: Osseous structures are diffusely osteopenic. Degenerative changes are seen within the lumbar spine. No acute bone abnormality. No lytic or blastic bone lesion. IMPRESSION: 1. No acute intra-abdominal pathology identified. No definite radiographic explanation for the patient's reported symptoms. 2. Moderate left inguinal hernia containing several unremarkable loops of mid jejunum. No evidence of obstruction or focal inflammation. 3. Cholelithiasis. 4. Mild bladder wall thickening, similar to multiple prior examinations, suggesting changes of chronic bladder outlet obstruction. 5. Aortic Atherosclerosis (ICD10-I70.0). Electronically Signed   By: Fidela Salisbury M.D.   On: 07/03/2022 22:59   CT ABDOMEN PELVIS W CONTRAST  Result Date: 06/30/2022 CLINICAL DATA:  Nausea vomiting and diarrhea with pain to left groin area EXAM: CT ABDOMEN AND PELVIS WITH CONTRAST TECHNIQUE: Multidetector CT imaging of the abdomen and pelvis was performed using the standard protocol following bolus administration of intravenous contrast. RADIATION DOSE REDUCTION: This exam was performed according to the departmental dose-optimization program which includes automated exposure control, adjustment of the mA and/or kV according to patient size and/or use of iterative reconstruction technique. CONTRAST:  57m OMNIPAQUE IOHEXOL 300 MG/ML  SOLN COMPARISON:  CT  abdomen and pelvis dated July 31, 2007 FINDINGS: Lower chest: Cardiomegaly.  Mitral annular calcifications. Hepatobiliary: No focal liver abnormality is seen. No gallstones, gallbladder wall thickening, or biliary dilatation. Pancreas: Unremarkable. No pancreatic ductal dilatation or surrounding inflammatory changes. Spleen: Normal in size without focal abnormality. Adrenals/Urinary Tract: Bilateral adrenal glands are unremarkable. No hydronephrosis or nephrolithiasis. Bilateral low-attenuation renal lesions, largest are compatible with simple cysts, others are too Faulkner to completely characterize, no specific follow-up imaging is recommended for these lesions. Thick-walled and trabeculated urinary bladder. Stomach/Bowel: Multiple dilated loops of Faulkner bowel with transition point at the neck of a moderate sized left inguinal hernia. Decompressed distal Faulkner bowel loops are seen distal to the hernia sac. No evidence of bowel wall thickening or pneumatosis. Vascular/Lymphatic: Severe aortic atherosclerosis. No enlarged abdominal or pelvic lymph nodes. Reproductive: Multi fibroid uterus Other: No abdominopelvic ascites or intra-free air. Musculoskeletal: No acute or significant osseous findings. IMPRESSION: 1. Multiple dilated loops of Faulkner bowel with gradual transition to decompressed bowel at the neck of a moderate sized left inguinal hernia, findings are favored to be due to Faulkner-bowel obstruction secondary to hernia. 2. Thick-walled and trabeculated urinary bladder, suggestive of chronic bladder dysfunction. Recommend correlation with urinalysis to exclude infection. 3. Aortic Atherosclerosis (ICD10-I70.0). Electronically Signed   By: LYetta GlassmanM.D.   On: 06/30/2022 18:29    Medications / Allergies: per chart  Antibiotics: Anti-infectives (From admission, onward)    None         Note: Portions of this report may have been transcribed using voice recognition software. Every effort was  made to ensure accuracy; however, inadvertent computerized transcription errors may be present.   Any transcriptional errors that result from this process are unintentional.    SAdin Hector  MD, FACS, MASCRS Esophageal, Gastrointestinal & Colorectal Surgery Robotic and Minimally Invasive Surgery  [facility] [place] [address], [place], [place] [address] [phone] Fax [phone] Main  CONTACT INFORMATION:  Weekday (9AM-5PM): Call [facility] main office at [phone]  Weeknight (5PM-9AM) or Weekend/Holiday: Check [email or web address] (password " [email or web address]") for General Surgery [facility] coverage  (Please, do not use SecureChat as it is not reliable communication to reach operating surgeons for immediate patient care given surgeries/outpatient duties/clinic/cross-coverage/off post-call which would lead to a delay in care.  Epic staff messaging available for outptient concerns, but may not be answered for 48 hours or more).      07/04/2022  3:14 AM

## 2022-07-04 NOTE — Assessment & Plan Note (Signed)
On admission, patient and family reported no BM in over a week.  Resolving with aggressive bowel regimen.  Having multiple episodes diarrhea today.

## 2022-07-04 NOTE — Progress Notes (Signed)
Chaplain engaged in an initial visit with [place].  She voiced that she may be interested in completing the Advanced Directive, Healthcare POA, but not at this time.  Chaplain left the paperwork with her and let her know how to page her if she does decide she wants to proceed.  Lawsyn shared that she is just getting to a place of being able to sit up and read.  She was reading a newspaper when Yarnell entered.  Chaplain offered understanding.   Chaplain will follow-up as needed.    07/04/22 1400  Clinical Encounter Type  Visited With Patient  Visit Type Initial;Social support

## 2022-07-04 NOTE — Assessment & Plan Note (Signed)
General surgery consulted for consideration of hernia repair surgery.  No bowel obstruction or signs of incarceration at this point, hernia was reportedly reducible in the ED. Follow-up general surgery recommendations.  They are considering taking patient to the OR possibly tomorrow but ongoing discussions with patient and family at this time.

## 2022-07-04 NOTE — ED Notes (Signed)
Patient had a bowel movement. Provided peri care.

## 2022-07-04 NOTE — Assessment & Plan Note (Signed)
No acute issues.

## 2022-07-04 NOTE — Assessment & Plan Note (Signed)
No acute issues.  Consider PT evaluation if mobility concerns.

## 2022-07-04 NOTE — ED Notes (Signed)
Assisted patient up to bedside commode to have a BM. JRPRN

## 2022-07-04 NOTE — Assessment & Plan Note (Signed)
Suspect patient's abdominal pain from severe constipation given no BM in over a week.  She reports pain to be improved since having bowel movements but continues to have cramping pain prior to BMs. --General surgery following --Continue bowel regimen per orders --Monitor abdominal exam and bowel function closely --Clear liquid diet for now, advancement per surgery

## 2022-07-05 ENCOUNTER — Observation Stay (HOSPITAL_BASED_OUTPATIENT_CLINIC_OR_DEPARTMENT_OTHER): Payer: 59

## 2022-07-05 DIAGNOSIS — I2489 Other forms of acute ischemic heart disease: Secondary | ICD-10-CM | POA: Insufficient documentation

## 2022-07-05 DIAGNOSIS — R011 Cardiac murmur, unspecified: Secondary | ICD-10-CM | POA: Diagnosis not present

## 2022-07-05 DIAGNOSIS — I350 Nonrheumatic aortic (valve) stenosis: Secondary | ICD-10-CM

## 2022-07-05 DIAGNOSIS — R1084 Generalized abdominal pain: Secondary | ICD-10-CM | POA: Diagnosis not present

## 2022-07-05 LAB — ECHOCARDIOGRAM COMPLETE
AR max vel: 1.39 cm2
AV Area VTI: 1.14 cm2
AV Area mean vel: 1.42 cm2
AV Mean grad: 14 mmHg
AV Peak grad: 28.4 mmHg
Ao pk vel: 2.66 m/s
Area-P 1/2: 2.26 cm2
Calc EF: 74.3 %
Height: 64 in
MV VTI: 1.75 cm2
S' Lateral: 1.8 cm
Single Plane A2C EF: 76 %
Single Plane A4C EF: 73.6 %
Weight: 1920.01 oz

## 2022-07-05 LAB — TROPONIN I (HIGH SENSITIVITY): Troponin I (High Sensitivity): 34 ng/L — ABNORMAL HIGH (ref <18)

## 2022-07-05 MED ORDER — PERFLUTREN LIPID MICROSPHERE
1.0000 mL | INTRAVENOUS | Status: AC | PRN
  Administered 2022-07-05: 2 mL via INTRAVENOUS

## 2022-07-05 NOTE — Progress Notes (Signed)
Abdominal pain  Subjective: No abd pain this am  Objective: Vital signs in last 24 hours: Temp:  [97.8 F (36.6 C)-98.6 F (37 C)] 97.9 F (36.6 C) (12/28 0350) Pulse Rate:  [83-94] 92 (12/28 0350) Resp:  [15-22] 18 (12/28 0350) BP: (139-181)/(77-98) 177/90 (12/28 0350) SpO2:  [78 %-98 %] 95 % (12/28 0350) Last BM Date : 07/04/22  Intake/Output from previous day: 12/27 0701 - 12/28 0700 In: 1293.8 [P.O.:60; I.V.:1233.8] Out: -  Intake/Output this shift: No intake/output data recorded.  General appearance: alert and cooperative GI: soft, nondistended  Lab Results:  No results found for this or any previous visit (from the past 24 hour(s)).   Studies/Results Radiology     MEDS, Scheduled  amLODipine  2.5 mg Oral Daily   bisacodyl  10 mg Rectal Daily   cholecalciferol  1,000 Units Oral Daily   hydrochlorothiazide  12.5 mg Oral Daily   lip balm   Topical BID     Assessment: Abdominal pain Reducible inguinal herina  Plan: Pt states she is not interested in any surgery to repair hernia.  This is reasonable given it is reducible.  Will start clears and ok to advance diet as tolerated  LOS: 0 days    Rosario Adie, MD [facility]  Patient's medical decision making was straightforward (25 mins met or exceeded with patient care and documentation).   07/05/2022 8:28 AM

## 2022-07-05 NOTE — TOC CM/SW Note (Signed)
Transition of Care [facility]) Screening Note  Patient Details  Name: [name] Date of Birth: [birth year]  Transition of Care [facility]) CM/SW Contact:    Sherie Don, LCSW Phone Number: 07/05/2022, 9:36 AM  Transition of Care Department [facility]) has reviewed patient and no TOC needs have been identified at this time. We will continue to monitor patient advancement through interdisciplinary progression rounds. If new patient transition needs arise, please place a TOC consult.

## 2022-07-05 NOTE — Plan of Care (Signed)

## 2022-07-05 NOTE — Progress Notes (Signed)
Discharge instructions given to patient and all questions were answered.  

## 2022-07-05 NOTE — Progress Notes (Signed)
Echocardiogram 2D Echocardiogram has been performed.  [name] 07/05/2022, 12:54 PM

## 2022-07-05 NOTE — Discharge Summary (Signed)
Physician Discharge Summary  [name] XNT:[id number] DOB: Sep 14, 1923 DOA: 07/03/2022  PCP: Mckinley Jewel, MD  Admit date: 07/03/2022 Discharge date: 07/05/2022  Admitted From: Home Disposition:  Home  Recommendations for Outpatient Follow-up:  Follow up with PCP Follow up with general surgery as needed if outpatient surgical repair of hernia is desired Outpatient cardiology referral for aortic stenosis   Discharge Condition: Stable CODE STATUS: Full  Diet recommendation:  Diet Orders (From admission, onward)     Start     Ordered   07/05/22 1242  DIET SOFT Room service appropriate? Yes; Fluid consistency: Thin  Diet effective now       Question Answer Comment  Room service appropriate? Yes   Fluid consistency: Thin      07/05/22 1241           Brief/Interim Summary: From H&P by Dr. Nicole Kindred: " [name] is a 88 y.o. female with medical history significant of rectal cancer status post resection in 2008, hypertension, chronic constipation, lumbar idiopathy, hiatal hernia who presented from home overnight with ongoing and worsening abdominal pain.  She reported associated nausea, single episode of nonbloody nonbilious emesis, constipation with no bowel movement in over a week.  Family report very poor p.o. intake for the past 11 days, few days ago she started having a little Jell-O and broth at times but still not eating much.  They expressed concern for dehydration.  She was seen by surgery overnight and started on aggressive bowel regimen, today having multiple episodes of diarrhea.   ED course --afebrile, stable vitals but blood pressure elevated at times with systolic ranging 967R to 916. Labs --CMP normal except for glucose 109, BUN 25, total bili 1.5.  Lactic acid normal 1.0.  CBC unremarkable.  Urinalysis negative for any signs of infection. Imaging --CT abdomen pelvis with contrast was repeated and showed no acute intra-abdominal findings that would explain  her symptoms.  Moderate left inguinal hernia containing unremarkable loops of mid jejunum without evidence of obstruction or inflammation to suggest incarceration, cholelithiasis, mild bladder wall thickening.   Patient is admitted for observation with general surgery consulted."  Subjective on day of discharge: Patient had no complaints of chest pain, abdominal pain.  General surgery consulted and patient was not interested in hernia repair.  Diet was advanced.  Patient did have elevated troponin during her recent hospitalization.  This was repeated with improvement since previous test result.  Echocardiogram was completed without regional wall motion abnormality, mild to moderate aortic stenosis.  She may elect to follow-up with outpatient PCP and cardiology referral.  Discharge Diagnoses:  Principal Problem:   Abdominal pain Active Problems:   Constipation, chronic   Inguinal hernia, left -   HTN (hypertension)   Lumbar radiculopathy   Personal history of pT3pN0 rectal cancer s/p LAR resection 2008   Demand ischemia   Aortic stenosis   Discharge Instructions  Discharge Instructions     Call MD for:  difficulty breathing, headache or visual disturbances   Complete by: As directed    Call MD for:  extreme fatigue   Complete by: As directed    Call MD for:  persistant dizziness or light-headedness   Complete by: As directed    Call MD for:  persistant nausea and vomiting   Complete by: As directed    Call MD for:  severe uncontrolled pain   Complete by: As directed    Call MD for:  temperature >100.4   Complete by: As  directed    Discharge instructions   Complete by: As directed    You were cared for by a hospitalist during your hospital stay. If you have any questions about your discharge medications or the care you received while you were in the hospital after you are discharged, you can call the unit and ask to speak with the hospitalist on call if the hospitalist that took  care of you is not available. Once you are discharged, your primary care physician will handle any further medical issues. Please note that NO REFILLS for any discharge medications will be authorized once you are discharged, as it is imperative that you return to your primary care physician (or establish a relationship with a primary care physician if you do not have one) for your aftercare needs so that they can reassess your need for medications and monitor your lab values.   Increase activity slowly   Complete by: As directed       Allergies as of 07/05/2022       Reactions   Levofloxacin    REACTION: Got very hot and turned red all over        Medication List     STOP taking these medications    clindamycin 300 MG capsule Commonly known as: Cleocin   lidocaine 5 % Commonly known as: Lidoderm   penicillin v potassium 250 MG tablet Commonly known as: VEETID   traMADol 50 MG tablet Commonly known as: ULTRAM   triamcinolone cream 0.5 % Commonly known as: KENALOG       TAKE these medications    amLODipine 2.5 MG tablet Commonly known as: NORVASC Take 1 tablet (2.5 mg total) by mouth daily.   cholecalciferol 25 MCG (1000 UNIT) tablet Commonly known as: VITAMIN D3 Take 1,000 Units by mouth daily.   hydrochlorothiazide 12.5 MG capsule Commonly known as: MICROZIDE TAKE 1 CAPSULE BY MOUTH EVERY DAY IN THE MORNING FOR 11 DAYS        Follow-up Information     Pahwani, Michell Heinrich, MD Follow up.   Specialty: Internal Medicine Contact information: [address] [place] [address] [phone]         [facility], [place] Follow up.   Specialty: General Surgery Why: As needed, If symptoms worsen Contact information: [address] [place] [address] [phone]                Allergies  Allergen Reactions   Levofloxacin     REACTION: Got very hot and turned red all over    Consultations: General surgery     Procedures/Studies: ECHOCARDIOGRAM COMPLETE  Result Date: 07/05/2022    ECHOCARDIOGRAM REPORT   Patient Name:   [name] Date of Exam: 07/05/2022 Medical Rec #:  [id number]     Height:       64.0 in Accession #:    [id number]    Weight:       120.0 lb Date of Birth:  [birth year]     BSA:          1.575 m Patient Age:    26 years      BP:           169/83 mmHg Patient Gender: F             HR:           102 bpm. Exam Location:  Inpatient Procedure: 2D Echo, Cardiac Doppler, Color Doppler and Intracardiac  Opacification Agent Indications:    Murmur R01.1                 Elevated Troponin  History:        Patient has no prior history of Echocardiogram examinations.                 Risk Factors:Hypertension.  Sonographer:    Bernadene Person RDCS Referring Phys: [id number] [place]  1. Left ventricular ejection fraction, by estimation, is 60 to 65%. The left ventricle has normal function. The left ventricle has no regional wall motion abnormalities. There is mild concentric left ventricular hypertrophy. Left ventricular diastolic parameters are indeterminate.  2. Right ventricular systolic function is normal. The right ventricular size is moderately enlarged. There is moderately elevated pulmonary artery systolic pressure.  3. The mitral valve is degenerative. Mild mitral valve regurgitation. Mild mitral stenosis.  4. Tricuspid valve regurgitation is moderate.  5. The aortic valve is tricuspid. Aortic valve regurgitation is trivial. Mild to moderate aortic valve stenosis. Aortic valve area, by VTI measures 1.14 cm. Aortic valve mean gradient measures 14.0 mmHg. Aortic valve Vmax measures 2.66 m/s.  6. Aneurysm of the ascending aorta, measuring 46 mm.  7. The inferior vena cava is normal in size with greater than 50% respiratory variability, suggesting right atrial pressure of 3 mmHg. FINDINGS  Left Ventricle: Left ventricular ejection fraction, by estimation, is 60 to 65%. The left  ventricle has normal function. The left ventricle has no regional wall motion abnormalities. Definity contrast agent was given IV to delineate the left ventricular  endocardial borders. The left ventricular internal cavity size was normal in size. There is mild concentric left ventricular hypertrophy. Left ventricular diastolic parameters are indeterminate. Right Ventricle: The right ventricular size is moderately enlarged. No increase in right ventricular wall thickness. Right ventricular systolic function is normal. There is moderately elevated pulmonary artery systolic pressure. The tricuspid regurgitant  velocity is 3.52 m/s, and with an assumed right atrial pressure of 3 mmHg, the estimated right ventricular systolic pressure is 51.0 mmHg. Left Atrium: Left atrial size was normal in size. Right Atrium: Right atrial size was normal in size. Pericardium: There is no evidence of pericardial effusion. Mitral Valve: The mitral valve is degenerative in appearance. Mild mitral valve regurgitation. Mild mitral valve stenosis. MV peak gradient, 10.4 mmHg. The mean mitral valve gradient is 4.0 mmHg. Tricuspid Valve: The tricuspid valve is normal in structure. Tricuspid valve regurgitation is moderate . No evidence of tricuspid stenosis. Aortic Valve: The aortic valve is tricuspid. Aortic valve regurgitation is trivial. Mild to moderate aortic stenosis is present. Aortic valve mean gradient measures 14.0 mmHg. Aortic valve peak gradient measures 28.4 mmHg. Aortic valve area, by VTI measures 1.14 cm. Pulmonic Valve: The pulmonic valve was normal in structure. Pulmonic valve regurgitation is not visualized. No evidence of pulmonic stenosis. Aorta: There is an aneurysm involving the ascending aorta measuring 46 mm. Venous: The inferior vena cava is normal in size with greater than 50% respiratory variability, suggesting right atrial pressure of 3 mmHg. IAS/Shunts: No atrial level shunt detected by color flow Doppler.  LEFT  VENTRICLE PLAX 2D LVIDd:         2.50 cm     Diastology LVIDs:         1.80 cm     LV e' medial:    6.00 cm/s LV PW:         1.00 cm     LV E/e' medial:  15.8 LV IVS:        1.00 cm     LV e' lateral:   7.70 cm/s LVOT diam:     2.00 cm     LV E/e' lateral: 12.3 LV SV:         61 LV SV Index:   39 LVOT Area:     3.14 cm  LV Volumes (MOD) LV vol d, MOD A2C: 51.2 ml LV vol d, MOD A4C: 59.4 ml LV vol s, MOD A2C: 12.3 ml LV vol s, MOD A4C: 15.7 ml LV SV MOD A2C:     38.9 ml LV SV MOD A4C:     59.4 ml LV SV MOD BP:      41.5 ml RIGHT VENTRICLE RV S prime:     13.80 cm/s TAPSE (M-mode): 2.0 cm LEFT ATRIUM             Index        RIGHT ATRIUM           Index LA diam:        2.50 cm 1.59 cm/m   RA Area:     21.30 cm LA Vol (A2C):   41.9 ml 26.61 ml/m  RA Volume:   67.10 ml  42.61 ml/m LA Vol (A4C):   31.4 ml 19.94 ml/m LA Biplane Vol: 38.0 ml 24.13 ml/m  AORTIC VALVE AV Area (Vmax):    1.39 cm AV Area (Vmean):   1.42 cm AV Area (VTI):     1.14 cm AV Vmax:           266.33 cm/s AV Vmean:          170.000 cm/s AV VTI:            0.537 m AV Peak Grad:      28.4 mmHg AV Mean Grad:      14.0 mmHg LVOT Vmax:         118.00 cm/s LVOT Vmean:        77.100 cm/s LVOT VTI:          0.194 m LVOT/AV VTI ratio: 0.36  AORTA Ao Root diam: 2.80 cm Ao Asc diam:  4.60 cm MITRAL VALVE                TRICUSPID VALVE MV Area (PHT): 2.26 cm     TR Peak grad:   49.6 mmHg MV Area VTI:   1.75 cm     TR Vmax:        352.00 cm/s MV Peak grad:  10.4 mmHg MV Mean grad:  4.0 mmHg     SHUNTS MV Vmax:       1.61 m/s     Systemic VTI:  0.19 m MV Vmean:      101.0 cm/s   Systemic Diam: 2.00 cm MV Decel Time: 335 msec MV E velocity: 94.50 cm/s MV A velocity: 147.00 cm/s MV E/A ratio:  0.64 Kardie Tobb DO Electronically signed by Berniece Salines DO Signature Date/Time: 07/05/2022/2:16:56 PM    Final    DG Abd Portable 1V-Small Bowel Obstruction Protocol-initial, 8 hr delay  Result Date: 07/04/2022 CLINICAL DATA:  Small-bowel obstruction protocol,  8 hour delay EXAM: PORTABLE ABDOMEN - 1 VIEW COMPARISON:  Portable exam 1223 hours compared to CT abdomen and pelvis 07/03/2022 FINDINGS: Excreted contrast material within renal collecting systems, ureters, and bladder. Oral contrast is present throughout colon. Decreased small bowel distension. Bones demineralized. IMPRESSION: GI contrast has passed through small bowel loops  to colon without evidence of obstruction. Electronically Signed   By: Lavonia Dana M.D.   On: 07/04/2022 12:46   CT Abdomen Pelvis W Contrast  Result Date: 07/03/2022 CLINICAL DATA:  Bowel obstruction suspected. Left lower quadrant abdominal pain EXAM: CT ABDOMEN AND PELVIS WITH CONTRAST TECHNIQUE: Multidetector CT imaging of the abdomen and pelvis was performed using the standard protocol following bolus administration of intravenous contrast. RADIATION DOSE REDUCTION: This exam was performed according to the departmental dose-optimization program which includes automated exposure control, adjustment of the mA and/or kV according to patient size and/or use of iterative reconstruction technique. CONTRAST:  87m OMNIPAQUE IOHEXOL 300 MG/ML  SOLN COMPARISON:  None Available. FINDINGS: Lower chest: No acute abnormality. Cardiac size is mildly enlarged. The descending thoracic aorta is dilated measuring 3.3 cm in diameter, stable since prior exam. Hepatobiliary: Cholelithiasis without pericholecystic inflammatory change. Liver unremarkable. No intra or extrahepatic biliary ductal dilation. Pancreas: Unremarkable Spleen: Unremarkable Adrenals/Urinary Tract: The adrenal glands are unremarkable. Multiple simple cortical cysts are seen within the left kidney, benign. No follow-up imaging is recommended for these lesions. The kidneys are otherwise unremarkable. The bladder is mildly thick walled, similar prior examination and remote prior examination of 01/02/2007, suggesting changes of chronic bladder outlet obstruction. Stomach/Bowel: Moderate  left inguinal hernia is present containing several unremarkable loops of mid jejunum. The stomach, small bowel, and large bowel are otherwise unremarkable. No evidence of obstruction or focal inflammation. The appendix is not clearly identified and may be absent. No free intraperitoneal gas or fluid. Vascular/Lymphatic: Extensive aortoiliac atherosclerotic calcification. No aortic aneurysm. No pathologic adenopathy within the abdomen and pelvis. Reproductive: Involuted calcified fibroid within the uterus. The pelvic organs are otherwise unremarkable. Other: Small fat containing right inguinal hernia. Musculoskeletal: Osseous structures are diffusely osteopenic. Degenerative changes are seen within the lumbar spine. No acute bone abnormality. No lytic or blastic bone lesion. IMPRESSION: 1. No acute intra-abdominal pathology identified. No definite radiographic explanation for the patient's reported symptoms. 2. Moderate left inguinal hernia containing several unremarkable loops of mid jejunum. No evidence of obstruction or focal inflammation. 3. Cholelithiasis. 4. Mild bladder wall thickening, similar to multiple prior examinations, suggesting changes of chronic bladder outlet obstruction. 5. Aortic Atherosclerosis (ICD10-I70.0). Electronically Signed   By: AFidela SalisburyM.D.   On: 07/03/2022 22:59   CT ABDOMEN PELVIS W CONTRAST  Result Date: 06/30/2022 CLINICAL DATA:  Nausea vomiting and diarrhea with pain to left groin area EXAM: CT ABDOMEN AND PELVIS WITH CONTRAST TECHNIQUE: Multidetector CT imaging of the abdomen and pelvis was performed using the standard protocol following bolus administration of intravenous contrast. RADIATION DOSE REDUCTION: This exam was performed according to the departmental dose-optimization program which includes automated exposure control, adjustment of the mA and/or kV according to patient size and/or use of iterative reconstruction technique. CONTRAST:  826mOMNIPAQUE IOHEXOL  300 MG/ML  SOLN COMPARISON:  CT abdomen and pelvis dated July 31, 2007 FINDINGS: Lower chest: Cardiomegaly.  Mitral annular calcifications. Hepatobiliary: No focal liver abnormality is seen. No gallstones, gallbladder wall thickening, or biliary dilatation. Pancreas: Unremarkable. No pancreatic ductal dilatation or surrounding inflammatory changes. Spleen: Normal in size without focal abnormality. Adrenals/Urinary Tract: Bilateral adrenal glands are unremarkable. No hydronephrosis or nephrolithiasis. Bilateral low-attenuation renal lesions, largest are compatible with simple cysts, others are too small to completely characterize, no specific follow-up imaging is recommended for these lesions. Thick-walled and trabeculated urinary bladder. Stomach/Bowel: Multiple dilated loops of small bowel with transition point at the neck of a moderate sized  left inguinal hernia. Decompressed distal small bowel loops are seen distal to the hernia sac. No evidence of bowel wall thickening or pneumatosis. Vascular/Lymphatic: Severe aortic atherosclerosis. No enlarged abdominal or pelvic lymph nodes. Reproductive: Multi fibroid uterus Other: No abdominopelvic ascites or intra-free air. Musculoskeletal: No acute or significant osseous findings. IMPRESSION: 1. Multiple dilated loops of small bowel with gradual transition to decompressed bowel at the neck of a moderate sized left inguinal hernia, findings are favored to be due to small-bowel obstruction secondary to hernia. 2. Thick-walled and trabeculated urinary bladder, suggestive of chronic bladder dysfunction. Recommend correlation with urinalysis to exclude infection. 3. Aortic Atherosclerosis (ICD10-I70.0). Electronically Signed   By: Yetta Glassman M.D.   On: 06/30/2022 18:29       Discharge Exam: Vitals:   07/05/22 0910 07/05/22 1234  BP: (!) 169/83 (!) 167/84  Pulse: 92 93  Resp: 18 18  Temp: 98.2 F (36.8 C) 98.4 F (36.9 C)  SpO2: 96% 97%    General: Pt  is alert, awake, not in acute distress Cardiovascular: RR, S1/S2 +, no edema, +systolic murmur  Respiratory: CTA bilaterally, no wheezing, no rhonchi, no respiratory distress, no conversational dyspnea  Abdominal: Soft, NT, ND, bowel sounds + Extremities: no edema, no cyanosis Psych: Normal mood and affect, stable judgement and insight     The results of significant diagnostics from this hospitalization (including imaging, microbiology, ancillary and laboratory) are listed below for reference.     Microbiology: No results found for this or any previous visit (from the past 240 hour(s)).   Labs: BNP (last 3 results) No results for input(s): "BNP" in the last 8760 hours. Basic Metabolic Panel: Recent Labs  Lab 06/30/22 1159 07/03/22 1401  NA 138 140  K 4.4 3.8  CL 98 102  CO2 27 26  GLUCOSE 104* 109*  BUN 53* 25*  CREATININE 1.16* 0.91  CALCIUM 9.3 9.2   Liver Function Tests: Recent Labs  Lab 06/30/22 1159 07/03/22 1401  AST 22 22  ALT 10 13  ALKPHOS 51 55  BILITOT 1.2 1.5*  PROT 7.3 7.0  ALBUMIN 4.1 3.6   Recent Labs  Lab 06/30/22 1159  LIPASE 43   No results for input(s): "AMMONIA" in the last 168 hours. CBC: Recent Labs  Lab 06/30/22 1159 07/03/22 1401  WBC 8.4 8.0  NEUTROABS  --  6.3  HGB 13.2 12.9  HCT 41.0 40.3  MCV 93.6 95.5  PLT 222 217   Cardiac Enzymes: No results for input(s): "CKTOTAL", "CKMB", "CKMBINDEX", "TROPONINI" in the last 168 hours. BNP: Invalid input(s): "POCBNP" CBG: No results for input(s): "GLUCAP" in the last 168 hours. D-Dimer No results for input(s): "DDIMER" in the last 72 hours. Hgb A1c No results for input(s): "HGBA1C" in the last 72 hours. Lipid Profile No results for input(s): "CHOL", "HDL", "LDLCALC", "TRIG", "CHOLHDL", "LDLDIRECT" in the last 72 hours. Thyroid function studies No results for input(s): "TSH", "T4TOTAL", "T3FREE", "THYROIDAB" in the last 72 hours.  Invalid input(s): "FREET3" Anemia work  up No results for input(s): "VITAMINB12", "FOLATE", "FERRITIN", "TIBC", "IRON", "RETICCTPCT" in the last 72 hours. Urinalysis    Component Value Date/Time   COLORURINE YELLOW 07/04/2022 0133   APPEARANCEUR CLEAR 07/04/2022 0133   LABSPEC 1.010 07/04/2022 0133   PHURINE 6.0 07/04/2022 0133   GLUCOSEU NEGATIVE 07/04/2022 0133   HGBUR NEGATIVE 07/04/2022 0133   BILIRUBINUR NEGATIVE 07/04/2022 0133   KETONESUR 40 (A) 07/04/2022 0133   PROTEINUR NEGATIVE 07/04/2022 0133   NITRITE NEGATIVE 07/04/2022 0133  LEUKOCYTESUR TRACE (A) 07/04/2022 0133   Sepsis Labs Recent Labs  Lab 06/30/22 1159 07/03/22 1401  WBC 8.4 8.0   Microbiology No results found for this or any previous visit (from the past 240 hour(s)).   Patient was seen and examined on the day of discharge and was found to be in stable condition. Time coordinating discharge: 40 minutes including assessment and coordination of care, as well as examination of the patient.   SIGNED:  Dessa Phi, DO [facility] 07/05/2022, 2:21 PM

## 2022-07-09 NOTE — Discharge Summary (Signed)
Physician Discharge Summary   Patient: [name] MRN: [id number] DOB: 08/13/1923  Admit date:     06/30/2022  Discharge date: 07/01/2022  Discharge Physician: Lequita Halt   PCP: Mckinley Jewel, MD   Recommendations at discharge:    SBO, partially improved however family insisted that the patient go home without being evaluated by general surgery.  Family and patient informed that if patient's symptoms of abdominal pain nauseous vomiting comes back, patient should come back to ER immediately.  And patient should avoid constipation with multiple OTC stool softener and/or laxative if necessary.  Patient and her family expressed understanding and agreed.  Discharge Diagnoses: Principal Problem:   SBO (small bowel obstruction) (HCC) Active Problems:   HTN (hypertension)  Resolved Problems:   SBO.  Patient came with new onset of symptoms of abdominal pain nauseous vomiting and obstipation was found to have SBO with a chronic sliding hernia of ventral abdominal wall.  During the hospital stay, patient's abdominal pain symptoms somewhat improved, but now passing gas or bowel movement.  Plan was to keep patient in the hospital for observation until her SBO symptoms all resolved versus surgical intervention and general surgeon is plan to see the patient ASAP on patient's arrival at [facility].  Family however decided that they wanted to take the patient home.  Discussed with patient and her family regarding severe consequences associated with SBO of bowel necrosis, septic shock and death, family however ignored the medical advice and took the patient home.          Pain control - [employer] Controlled Substance Reporting System database was reviewed. and patient was instructed, not to drive, operate heavy machinery, perform activities at heights, swimming or participation in water activities or provide baby-sitting services while on Pain, Sleep and Anxiety Medications; until their  outpatient Physician has advised to do so again. Also recommended to not to take more than prescribed Pain, Sleep and Anxiety Medications.  Consultants: General surgery Procedures performed: None  DISCHARGE MEDICATION: Allergies as of 07/01/2022       Reactions   Levofloxacin    REACTION: Got very hot and turned red all over        Medication List     ASK your doctor about these medications    amLODipine 2.5 MG tablet Commonly known as: NORVASC Take 1 tablet (2.5 mg total) by mouth daily.        Discharge Exam: Filed Weights   06/30/22 1154  Weight: 54.4 kg   Subjective:  Eyes: PERRL, lids and conjunctivae normal ENMT: Mucous membranes are moist. Posterior pharynx clear of any exudate or lesions.Normal dentition.  Neck: normal, supple, no masses, no thyromegaly Respiratory: clear to auscultation bilaterally, no wheezing, no crackles. Normal respiratory effort. No accessory muscle use.  Cardiovascular: Regular rate and rhythm, no murmurs / rubs / gallops. No extremity edema. 2+ pedal pulses. No carotid bruits.  Abdomen: mild tenderness on periumbilical right, large ventral hernia of lower abdomen around midline, reducible, mild tenderness, decreased bowel sounds on all quadrants, no masses palpated. No hepatosplenomegaly.  Musculoskeletal: no clubbing / cyanosis. No joint deformity upper and lower extremities. Good ROM, no contractures. Normal muscle tone.  Skin: no rashes, lesions, ulcers. No induration Neurologic: CN 2-12 grossly intact. Sensation intact, DTR normal.  Slight weakness on left side compared to right side Psychiatric: Normal judgment and insight. Alert and oriented x 3. Normal mood.    Condition at discharge: poor  The results of significant diagnostics  from this hospitalization (including imaging, microbiology, ancillary and laboratory) are listed below for reference.   Imaging Studies: ECHOCARDIOGRAM COMPLETE  Result Date: 07/05/2022     ECHOCARDIOGRAM REPORT   Patient Name:   MONSERAT PRESTIGIACOMO Date of Exam: 07/05/2022 Medical Rec #:  [id number]     Height:       64.0 in Accession #:    [id number]    Weight:       120.0 lb Date of Birth:  Jan 25, 1924     BSA:          1.575 m Patient Age:    89 years      BP:           169/83 mmHg Patient Gender: F             HR:           102 bpm. Exam Location:  Inpatient Procedure: 2D Echo, Cardiac Doppler, Color Doppler and Intracardiac            Opacification Agent Indications:    Murmur R01.1                 Elevated Troponin  History:        Patient has no prior history of Echocardiogram examinations.                 Risk Factors:Hypertension.  Sonographer:    Bernadene Person RDCS Referring Phys: [id number] [place]  1. Left ventricular ejection fraction, by estimation, is 60 to 65%. The left ventricle has normal function. The left ventricle has no regional wall motion abnormalities. There is mild concentric left ventricular hypertrophy. Left ventricular diastolic parameters are indeterminate.  2. Right ventricular systolic function is normal. The right ventricular size is moderately enlarged. There is moderately elevated pulmonary artery systolic pressure.  3. The mitral valve is degenerative. Mild mitral valve regurgitation. Mild mitral stenosis.  4. Tricuspid valve regurgitation is moderate.  5. The aortic valve is tricuspid. Aortic valve regurgitation is trivial. Mild to moderate aortic valve stenosis. Aortic valve area, by VTI measures 1.14 cm. Aortic valve mean gradient measures 14.0 mmHg. Aortic valve Vmax measures 2.66 m/s.  6. Aneurysm of the ascending aorta, measuring 46 mm.  7. The inferior vena cava is normal in size with greater than 50% respiratory variability, suggesting right atrial pressure of 3 mmHg. FINDINGS  Left Ventricle: Left ventricular ejection fraction, by estimation, is 60 to 65%. The left ventricle has normal function. The left ventricle has no regional wall motion  abnormalities. Definity contrast agent was given IV to delineate the left ventricular  endocardial borders. The left ventricular internal cavity size was normal in size. There is mild concentric left ventricular hypertrophy. Left ventricular diastolic parameters are indeterminate. Right Ventricle: The right ventricular size is moderately enlarged. No increase in right ventricular wall thickness. Right ventricular systolic function is normal. There is moderately elevated pulmonary artery systolic pressure. The tricuspid regurgitant  velocity is 3.52 m/s, and with an assumed right atrial pressure of 3 mmHg, the estimated right ventricular systolic pressure is 30.8 mmHg. Left Atrium: Left atrial size was normal in size. Right Atrium: Right atrial size was normal in size. Pericardium: There is no evidence of pericardial effusion. Mitral Valve: The mitral valve is degenerative in appearance. Mild mitral valve regurgitation. Mild mitral valve stenosis. MV peak gradient, 10.4 mmHg. The mean mitral valve gradient is 4.0 mmHg. Tricuspid Valve: The tricuspid valve is normal in structure. Tricuspid valve regurgitation is  moderate . No evidence of tricuspid stenosis. Aortic Valve: The aortic valve is tricuspid. Aortic valve regurgitation is trivial. Mild to moderate aortic stenosis is present. Aortic valve mean gradient measures 14.0 mmHg. Aortic valve peak gradient measures 28.4 mmHg. Aortic valve area, by VTI measures 1.14 cm. Pulmonic Valve: The pulmonic valve was normal in structure. Pulmonic valve regurgitation is not visualized. No evidence of pulmonic stenosis. Aorta: There is an aneurysm involving the ascending aorta measuring 46 mm. Venous: The inferior vena cava is normal in size with greater than 50% respiratory variability, suggesting right atrial pressure of 3 mmHg. IAS/Shunts: No atrial level shunt detected by color flow Doppler.  LEFT VENTRICLE PLAX 2D LVIDd:         2.50 cm     Diastology LVIDs:         1.80 cm      LV e' medial:    6.00 cm/s LV PW:         1.00 cm     LV E/e' medial:  15.8 LV IVS:        1.00 cm     LV e' lateral:   7.70 cm/s LVOT diam:     2.00 cm     LV E/e' lateral: 12.3 LV SV:         61 LV SV Index:   39 LVOT Area:     3.14 cm  LV Volumes (MOD) LV vol d, MOD A2C: 51.2 ml LV vol d, MOD A4C: 59.4 ml LV vol s, MOD A2C: 12.3 ml LV vol s, MOD A4C: 15.7 ml LV SV MOD A2C:     38.9 ml LV SV MOD A4C:     59.4 ml LV SV MOD BP:      41.5 ml RIGHT VENTRICLE RV S prime:     13.80 cm/s TAPSE (M-mode): 2.0 cm LEFT ATRIUM             Index        RIGHT ATRIUM           Index LA diam:        2.50 cm 1.59 cm/m   RA Area:     21.30 cm LA Vol (A2C):   41.9 ml 26.61 ml/m  RA Volume:   67.10 ml  42.61 ml/m LA Vol (A4C):   31.4 ml 19.94 ml/m LA Biplane Vol: 38.0 ml 24.13 ml/m  AORTIC VALVE AV Area (Vmax):    1.39 cm AV Area (Vmean):   1.42 cm AV Area (VTI):     1.14 cm AV Vmax:           266.33 cm/s AV Vmean:          170.000 cm/s AV VTI:            0.537 m AV Peak Grad:      28.4 mmHg AV Mean Grad:      14.0 mmHg LVOT Vmax:         118.00 cm/s LVOT Vmean:        77.100 cm/s LVOT VTI:          0.194 m LVOT/AV VTI ratio: 0.36  AORTA Ao Root diam: 2.80 cm Ao Asc diam:  4.60 cm MITRAL VALVE                TRICUSPID VALVE MV Area (PHT): 2.26 cm     TR Peak grad:   49.6 mmHg MV Area VTI:   1.75 cm  TR Vmax:        352.00 cm/s MV Peak grad:  10.4 mmHg MV Mean grad:  4.0 mmHg     SHUNTS MV Vmax:       1.61 m/s     Systemic VTI:  0.19 m MV Vmean:      101.0 cm/s   Systemic Diam: 2.00 cm MV Decel Time: 335 msec MV E velocity: 94.50 cm/s MV A velocity: 147.00 cm/s MV E/A ratio:  0.64 Kardie Tobb DO Electronically signed by Berniece Salines DO Signature Date/Time: 07/05/2022/2:16:56 PM    Final    DG Abd Portable 1V-Small Bowel Obstruction Protocol-initial, 8 hr delay  Result Date: 07/04/2022 CLINICAL DATA:  Small-bowel obstruction protocol, 8 hour delay EXAM: PORTABLE ABDOMEN - 1 VIEW COMPARISON:  Portable exam 1223  hours compared to CT abdomen and pelvis 07/03/2022 FINDINGS: Excreted contrast material within renal collecting systems, ureters, and bladder. Oral contrast is present throughout colon. Decreased small bowel distension. Bones demineralized. IMPRESSION: GI contrast has passed through small bowel loops to colon without evidence of obstruction. Electronically Signed   By: Lavonia Dana M.D.   On: 07/04/2022 12:46   CT Abdomen Pelvis W Contrast  Result Date: 07/03/2022 CLINICAL DATA:  Bowel obstruction suspected. Left lower quadrant abdominal pain EXAM: CT ABDOMEN AND PELVIS WITH CONTRAST TECHNIQUE: Multidetector CT imaging of the abdomen and pelvis was performed using the standard protocol following bolus administration of intravenous contrast. RADIATION DOSE REDUCTION: This exam was performed according to the departmental dose-optimization program which includes automated exposure control, adjustment of the mA and/or kV according to patient size and/or use of iterative reconstruction technique. CONTRAST:  70m OMNIPAQUE IOHEXOL 300 MG/ML  SOLN COMPARISON:  None Available. FINDINGS: Lower chest: No acute abnormality. Cardiac size is mildly enlarged. The descending thoracic aorta is dilated measuring 3.3 cm in diameter, stable since prior exam. Hepatobiliary: Cholelithiasis without pericholecystic inflammatory change. Liver unremarkable. No intra or extrahepatic biliary ductal dilation. Pancreas: Unremarkable Spleen: Unremarkable Adrenals/Urinary Tract: The adrenal glands are unremarkable. Multiple simple cortical cysts are seen within the left kidney, benign. No follow-up imaging is recommended for these lesions. The kidneys are otherwise unremarkable. The bladder is mildly thick walled, similar prior examination and remote prior examination of 01/02/2007, suggesting changes of chronic bladder outlet obstruction. Stomach/Bowel: Moderate left inguinal hernia is present containing several unremarkable loops of mid  jejunum. The stomach, small bowel, and large bowel are otherwise unremarkable. No evidence of obstruction or focal inflammation. The appendix is not clearly identified and may be absent. No free intraperitoneal gas or fluid. Vascular/Lymphatic: Extensive aortoiliac atherosclerotic calcification. No aortic aneurysm. No pathologic adenopathy within the abdomen and pelvis. Reproductive: Involuted calcified fibroid within the uterus. The pelvic organs are otherwise unremarkable. Other: Small fat containing right inguinal hernia. Musculoskeletal: Osseous structures are diffusely osteopenic. Degenerative changes are seen within the lumbar spine. No acute bone abnormality. No lytic or blastic bone lesion. IMPRESSION: 1. No acute intra-abdominal pathology identified. No definite radiographic explanation for the patient's reported symptoms. 2. Moderate left inguinal hernia containing several unremarkable loops of mid jejunum. No evidence of obstruction or focal inflammation. 3. Cholelithiasis. 4. Mild bladder wall thickening, similar to multiple prior examinations, suggesting changes of chronic bladder outlet obstruction. 5. Aortic Atherosclerosis (ICD10-I70.0). Electronically Signed   By: AFidela SalisburyM.D.   On: 07/03/2022 22:59   CT ABDOMEN PELVIS W CONTRAST  Result Date: 06/30/2022 CLINICAL DATA:  Nausea vomiting and diarrhea with pain to left groin area EXAM: CT ABDOMEN AND  PELVIS WITH CONTRAST TECHNIQUE: Multidetector CT imaging of the abdomen and pelvis was performed using the standard protocol following bolus administration of intravenous contrast. RADIATION DOSE REDUCTION: This exam was performed according to the departmental dose-optimization program which includes automated exposure control, adjustment of the mA and/or kV according to patient size and/or use of iterative reconstruction technique. CONTRAST:  63m OMNIPAQUE IOHEXOL 300 MG/ML  SOLN COMPARISON:  CT abdomen and pelvis dated July 31, 2007  FINDINGS: Lower chest: Cardiomegaly.  Mitral annular calcifications. Hepatobiliary: No focal liver abnormality is seen. No gallstones, gallbladder wall thickening, or biliary dilatation. Pancreas: Unremarkable. No pancreatic ductal dilatation or surrounding inflammatory changes. Spleen: Normal in size without focal abnormality. Adrenals/Urinary Tract: Bilateral adrenal glands are unremarkable. No hydronephrosis or nephrolithiasis. Bilateral low-attenuation renal lesions, largest are compatible with simple cysts, others are too small to completely characterize, no specific follow-up imaging is recommended for these lesions. Thick-walled and trabeculated urinary bladder. Stomach/Bowel: Multiple dilated loops of small bowel with transition point at the neck of a moderate sized left inguinal hernia. Decompressed distal small bowel loops are seen distal to the hernia sac. No evidence of bowel wall thickening or pneumatosis. Vascular/Lymphatic: Severe aortic atherosclerosis. No enlarged abdominal or pelvic lymph nodes. Reproductive: Multi fibroid uterus Other: No abdominopelvic ascites or intra-free air. Musculoskeletal: No acute or significant osseous findings. IMPRESSION: 1. Multiple dilated loops of small bowel with gradual transition to decompressed bowel at the neck of a moderate sized left inguinal hernia, findings are favored to be due to small-bowel obstruction secondary to hernia. 2. Thick-walled and trabeculated urinary bladder, suggestive of chronic bladder dysfunction. Recommend correlation with urinalysis to exclude infection. 3. Aortic Atherosclerosis (ICD10-I70.0). Electronically Signed   By: LYetta GlassmanM.D.   On: 06/30/2022 18:29    Microbiology: Results for orders placed or performed during the hospital encounter of 02/26/12  Blood culture (routine x 2)     Status: None   Collection Time: 02/26/12 12:45 PM   Specimen: BLOOD  Result Value Ref Range Status   Specimen Description BLOOD LEFT ARM   Final   Special Requests BOTTLES DRAWN AEROBIC ONLY 3 CC  Final   Culture  Setup Time 02/27/2012 01:15  Final   Culture NO GROWTH 5 DAYS  Final   Report Status 03/04/2012 FINAL  Final  Blood culture (routine x 2)     Status: None   Collection Time: 02/26/12  2:35 PM   Specimen: BLOOD  Result Value Ref Range Status   Specimen Description BLOOD RIGHT ARM  Final   Special Requests BOTTLES DRAWN AEROBIC ONLY 3ML  Final   Culture  Setup Time 02/27/2012 01:15  Final   Culture NO GROWTH 5 DAYS  Final   Report Status 03/04/2012 FINAL  Final    Labs: CBC: Recent Labs  Lab 07/03/22 1401  WBC 8.0  NEUTROABS 6.3  HGB 12.9  HCT 40.3  MCV 95.5  PLT 2546  Basic Metabolic Panel: Recent Labs  Lab 07/03/22 1401  NA 140  K 3.8  CL 102  CO2 26  GLUCOSE 109*  BUN 25*  CREATININE 0.91  CALCIUM 9.2   Liver Function Tests: Recent Labs  Lab 07/03/22 1401  AST 22  ALT 13  ALKPHOS 55  BILITOT 1.5*  PROT 7.0  ALBUMIN 3.6   CBG: No results for input(s): "GLUCAP" in the last 168 hours.  Discharge time spent: less than 30 minutes.  Signed: PLequita Halt MD [facility] 07/09/2022

## 2022-11-13 ENCOUNTER — Encounter (HOSPITAL_BASED_OUTPATIENT_CLINIC_OR_DEPARTMENT_OTHER): Payer: 59 | Attending: General Surgery | Admitting: Internal Medicine

## 2022-11-13 DIAGNOSIS — I10 Essential (primary) hypertension: Secondary | ICD-10-CM | POA: Insufficient documentation

## 2022-11-13 DIAGNOSIS — I70233 Atherosclerosis of native arteries of right leg with ulceration of ankle: Secondary | ICD-10-CM | POA: Diagnosis not present

## 2022-11-13 DIAGNOSIS — I87331 Chronic venous hypertension (idiopathic) with ulcer and inflammation of right lower extremity: Secondary | ICD-10-CM | POA: Diagnosis present

## 2022-11-13 DIAGNOSIS — L97311 Non-pressure chronic ulcer of right ankle limited to breakdown of skin: Secondary | ICD-10-CM | POA: Insufficient documentation

## 2022-11-13 NOTE — Progress Notes (Signed)
[name] ([id number]) [id number] Page 1 of 8 Visit Report for 11/13/2022 Chief Complaint Document Details Patient Name: Date of Service: [name], [name] RNA 11/13/2022 2:00 PM Medical Record Number: [id number] Patient Account Number: [id number] Date of Birth/Sex: Treating RN: 09-17-1923 (89 y.o. F) Primary Care Provider: Ardean Larsen Other Clinician: Referring Provider: Treating Provider/Extender: Lamont Snowball, Tacey Ruiz in Treatment: 0 Information Obtained from: Patient Chief Complaint 11/13/2022; patient is here for review of wounds on her right lateral and right medial ankle Electronic Signature(s) Signed: 11/13/2022 3:53:26 PM By: Baltazar Najjar MD Entered By: Baltazar Najjar on 11/13/2022 15:22:42 -------------------------------------------------------------------------------- HPI Details Patient Name: Date of Service: [name], [place] RNA 11/13/2022 2:00 PM Medical Record Number: [id number] Patient Account Number: [id number] Date of Birth/Sex: Treating RN: December 21, 1923 (89 y.o. F) Primary Care Provider: Ardean Larsen Other Clinician: Referring Provider: Treating Provider/Extender: Lamont Snowball, Tacey Ruiz in Treatment: 0 History of Present Illness HPI Description: ADMISSION 11/12/2021 This is a 89 year old independent woman who arrives for review of wounds on her lateral and medial ankle. It is a bit difficult to get a precise history here but it is quite clear that these have been present for many months. She states that she normally wore stockings however the wound laterally started to drain too much so she has not been wearing them. She has developed a circular erythematous area around the wounds on the lateral malleolus and her primary care doctor has given her samples of Nuzyra to use for cellulitis. Past medical history is actually very limited to hypertension, hearing loss. She had a rectal resection for cancer in 2008. In 2023  she had a small bowel obstruction. She also says she has a history of cellulitis of both legs ABIs in our clinic were difficult to obtain at 0.61 bilaterally Electronic Signature(s) Signed: 11/13/2022 3:53:26 PM By: Baltazar Najjar MD Entered By: Baltazar Najjar on 11/13/2022 15:24:45 -------------------------------------------------------------------------------- Physical Exam Details Patient Name: Date of Service: [name], [place] RNA 11/13/2022 2:00 PM Medical Record Number: [id number] Patient Account Number: [id number] Date of Birth/Sex: Treating RN: 26-Sep-1923 (89 y.o. F) Primary Care Provider: Ardean Larsen Other Clinician: Referring Provider: Treating Provider/Extender: Lamont Snowball, [name] in Treatment: 0 Constitutional Patient is hypotensive.. Pulse regular and within target range for patient.Marland Kitchen Respirations regular, non-labored and within target range.Marland Kitchen Appears in no distress. Cardiovascular Pedal pulses are very difficult to feel bilaterally. Edema present in both extremities.2+ pitting.Marland Kitchen [name] ([id number]) [id number] Page 2 of 8 Notes Wound exam; this patient has a small punched-out area over the medial right malleolus. On the right side she has several small open areas that are more superficial with circular surrounding erythema that is quite tender. The area was marked for purposes of comparison when she is next seen Electronic Signature(s) Signed: 11/13/2022 3:53:26 PM By: Baltazar Najjar MD Entered By: Baltazar Najjar on 11/13/2022 15:27:54 -------------------------------------------------------------------------------- Physician Orders Details Patient Name: Date of Service: [name], [place] RNA 11/13/2022 2:00 PM Medical Record Number: [id number] Patient Account Number: [id number] Date of Birth/Sex: Treating RN: [birth year] (89 y.o. 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 - Dr. Mikey Bussing 3pm 11/22/2022 room 8 Return A Nurse Visit: - Friday 1000am 11/16/2022 room 7 Other: - start nuzyra antiboitic  your provider prescribed. [facility] to call you to set up the appt for the test. Anesthetic (In clinic) Topical Lidocaine 4% applied to wound bed Edema Control - Lymphedema / SCD / Other Elevate legs to the level of the heart or above for 30 minutes daily and/or when sitting for 3-4 times a day throughout the day. Avoid standing for long periods of time. Exercise regularly Wound Treatment Wound #1 - Ankle Wound Laterality: Right, Lateral Cleanser: Soap and Water 1 x Per Week/30 Days Discharge Instructions: May shower and wash wound with dial antibacterial soap and water prior to dressing change. Cleanser: Vashe 5.8 (oz) 1 x Per Week/30 Days Discharge Instructions: Cleanse the wound with Vashe prior to applying a clean dressing using gauze sponges, not tissue or cotton balls. Peri-Wound Care: Triamcinolone 15 (g) 1 x Per Week/30 Days Discharge Instructions: Use triamcinolone 15 (g) as directed Peri-Wound Care: Sween Lotion (Moisturizing lotion) 1 x Per Week/30 Days Discharge Instructions: Apply moisturizing lotion as directed Prim Dressing: Hydrofera Blue Ready Transfer Foam, 8x8 (in/in) 1 x Per Week/30 Days ary Discharge Instructions: Apply to wound bed as instructed Secondary Dressing: ABD Pad, 8x10 1 x Per Week/30 Days Discharge Instructions: Apply over primary dressing as directed. Compression Wrap: Kerlix Roll 4.5x3.1 (in/yd) 1 x Per Week/30 Days Discharge Instructions: Apply Kerlix and Coban compression as  directed. Compression Wrap: Coban Self-Adherent Wrap 4x5 (in/yd) 1 x Per Week/30 Days Discharge Instructions: Apply over Kerlix as directed. Wound #2 - Ankle Wound Laterality: Right, Medial Cleanser: Soap and Water 1 x Per Week/30 Days Discharge Instructions: May shower and wash wound with dial antibacterial soap and water prior to dressing change. Cleanser: Vashe 5.8 (oz) 1 x Per Week/30 Days Discharge Instructions: Cleanse the wound with Vashe prior to applying a clean dressing using gauze sponges, not tissue or cotton balls. [name] ([id number]) [id number] Page 3 of 8 Peri-Wound Care: Triamcinolone 15 (g) 1 x Per Week/30 Days Discharge Instructions: Use triamcinolone 15 (g) as directed Peri-Wound Care: Sween Lotion (Moisturizing lotion) 1 x Per Week/30 Days Discharge Instructions: Apply moisturizing lotion as directed Prim Dressing: Hydrofera Blue Ready Transfer Foam, 8x8 (in/in) 1 x Per Week/30 Days ary Discharge Instructions: Apply to wound bed as instructed Secondary Dressing: ABD Pad, 8x10 1 x Per Week/30 Days Discharge Instructions: Apply over primary dressing as directed. Compression Wrap: Kerlix Roll 4.5x3.1 (in/yd) 1 x Per Week/30 Days Discharge Instructions: Apply Kerlix and Coban compression as directed. Compression Wrap: Coban Self-Adherent Wrap 4x5 (in/yd) 1 x Per Week/30 Days Discharge Instructions: Apply over Kerlix as directed. Services and Therapies rterial Studies- Unilateral right leg - [facility] to perform arterial studies for right leg with ABIs and TBIs. A Patient Medications llergies: Levaquin A Notifications Medication Indication Start End for any debridements 11/13/2022 lidocaine DOSE topical 4 % cream - cream topical once daily Electronic Signature(s) Signed: 11/13/2022 3:53:26 PM By: Baltazar Najjar MD Signed: 11/13/2022 4:32:52 PM By: Shawn Stall RN, BSN Entered By: Shawn Stall on 11/13/2022  15:16:44 Prescription 11/13/2022 -------------------------------------------------------------------------------- Eustace Moore MD Patient Name: Provider: 1923-12-24 [id number] Date of Birth: NPI#Jerral Ralph Sex: DEA #: [phone] [id number] Phone #: License #: [id number] UPN: Patient Address: [address] Eligha Bridegroom [facility] [place], [place] [address] [address] [place], [place] [address] [phone] Allergies Levaquin Provider's Orders rterial Studies- Unilateral right leg - [facility] to perform arterial studies for right leg with ABIs and TBIs. A Hand Signature:  Date(s): Electronic Signature(s) Signed: 11/13/2022 3:53:26 PM By: Baltazar Najjar MD Signed: 11/13/2022 4:32:52 PM By: Shawn Stall RN, BSN Entered By: Shawn Stall on 11/13/2022 15:16:44 Problem List Details -------------------------------------------------------------------------------- [name] ([id number]) [id number] Page 4 of 8 Patient Name: Date of ServiceCorinna Faulkner, [name] RNA 11/13/2022 2:00 PM Medical Record Number: [id number] Patient Account Number: [id number] Date of Birth/Sex: Treating RN: 1923/08/25 (89 y.o. 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 F) Primary Care Provider: Ardean Larsen Other Clinician: Referring Provider: Treating Provider/Extender: Lamont Snowball, Tacey Ruiz in Treatment: 0 Subjective Chief Complaint Information obtained from Patient 11/13/2022; patient is here for review of wounds on her right lateral and right medial ankle History of Present Illness (HPI) ADMISSION 11/12/2021 This is a 89 year old independent woman who arrives for review of wounds on her lateral and medial ankle. It is a bit difficult to get a precise history here but it is quite clear that these have been present for many months. She states that she normally wore stockings however the wound laterally started to drain too much so she has not been wearing them. She has developed a circular erythematous area around the wounds on the lateral malleolus and her primary care doctor has given her samples of Nuzyra to use for cellulitis. Past medical history is actually very limited to hypertension, hearing loss. She had a rectal resection for cancer in 2008. In 2023 she had a small bowel obstruction. She also says she has a history of cellulitis of both legs ABIs in our clinic were difficult to obtain at 0.61 bilaterally Patient History Allergies Levaquin Social History AIRAH, BOSMA ([id number]) [id number] Page 5 of 8 Never smoker, Alcohol Use - Never, Drug Use - No History, Caffeine Use - Never. Medical History Respiratory Patient has history of Asthma Cardiovascular Patient has history of Hypertension Oncologic Denies history of Received Chemotherapy,  Received Radiation Hospitalization/Surgery History - 2023 SBO, inguinal hernia, gangrene, FTT- bowel resection. - tonsillectomy and adenoidectomy. Medical A Surgical History Notes nd Cardiovascular aortic stenosis Gastrointestinal rectal Ca Integumentary (Skin) left lower leg cellulitis Oncologic 2008 rectal Ca Objective Constitutional Patient is hypotensive.. Pulse regular and within target range for patient.Marland Kitchen Respirations regular, non-labored and within target range.Marland Kitchen Appears in no distress. Vitals Time Taken: 2:23 PM, Height: 62 in, Source: Stated, Weight: 120 lbs, Source: Stated, BMI: 21.9, Temperature: 97.7 F, Pulse: 99 bpm, Respiratory Rate: 20 breaths/min, Blood Pressure: 164/94 mmHg. Cardiovascular Pedal pulses are very difficult to feel bilaterally. Edema present in both extremities.2+ pitting.. General Notes: Wound exam; this patient has a small punched-out area over the medial right malleolus. On the right side she has several small open areas that are more superficial with circular surrounding erythema that is quite tender. The area  was marked for purposes of comparison when she is next seen Integumentary (Hair, Skin) Wound #1 status is Open. Original cause of wound was Gradually Appeared. The date acquired was: 06/08/2022. The wound is located on the Right,Lateral Ankle. The wound measures 7cm length x 7.5cm width x 0.1cm depth; 41.233cm^2 area and 4.123cm^3 volume. There is Fat Layer (Subcutaneous Tissue) exposed. There is no tunneling or undermining noted. There is a medium amount of serosanguineous drainage noted. The wound margin is distinct with the outline attached to the wound base. There is large (67-100%) red, pink granulation within the wound bed. There is a small (1-33%) amount of necrotic tissue within the wound bed including Adherent Slough. The periwound skin appearance exhibited: Scarring, Hemosiderin Staining. The periwound skin appearance did not exhibit:  Callus, Crepitus, Excoriation, Induration, Rash, Dry/Scaly, Maceration, Atrophie Blanche, Cyanosis, Ecchymosis, Mottled, Pallor, Rubor, Erythema. The periwound has tenderness on palpation. Wound #2 status is Open. Original cause of wound was Gradually Appeared. The date acquired was: 06/08/2022. The wound is located on the Right,Medial Ankle. The wound measures 0.4cm length x 0.4cm width x 0.1cm depth; 0.126cm^2 area and 0.013cm^3 volume. There is Fat Layer (Subcutaneous Tissue) exposed. There is no tunneling or undermining noted. There is a medium amount of serosanguineous drainage noted. The wound margin is distinct with the outline attached to the wound base. There is medium (34-66%) red, pink granulation within the wound bed. There is a medium (34-66%) amount of necrotic tissue within the wound bed including Adherent Slough. The periwound skin appearance exhibited: Scarring, Hemosiderin Staining. The periwound skin appearance did not exhibit: Callus, Crepitus, Excoriation, Induration, Rash, Dry/Scaly, Maceration, Atrophie Blanche, Cyanosis, Ecchymosis, Mottled, Pallor, Rubor, Erythema. 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 - Dr. Mikey Bussing 3pm 11/22/2022 room 8 Nurse Visit: - Friday 1000am 11/16/2022 room 7 Other: - start nuzyra antiboitic your provider prescribed. [facility] to call you to set up the appt for the test. Anesthetic: [name] ([id number]) [id number] Page 6 of 8 (In clinic) Topical Lidocaine 4% applied to wound bed Edema Control - Lymphedema / SCD / Other: Elevate legs to the level of the heart or above for 30 minutes  daily and/or when sitting for 3-4 times a day throughout the day. Avoid standing for long periods of time. Exercise regularly Services and Therapies ordered were: Arterial Studies- Unilateral right leg - [facility] to perform arterial studies for right leg with ABIs and TBIs. The following medication(s) was prescribed: lidocaine topical 4 % cream cream topical once daily for for any debridements was prescribed at facility WOUND #1: - Ankle Wound Laterality: Right, Lateral Cleanser: Soap and Water 1 x Per Week/30 Days Discharge Instructions: May shower and wash wound with dial antibacterial soap and water prior to dressing change. Cleanser: Vashe 5.8 (oz) 1 x Per Week/30 Days Discharge Instructions: Cleanse the wound with Vashe prior to applying a clean dressing using gauze sponges, not tissue or cotton balls. Peri-Wound Care: Triamcinolone 15 (g) 1 x Per Week/30 Days Discharge Instructions: Use triamcinolone 15 (g) as directed Peri-Wound Care: Sween Lotion (Moisturizing lotion) 1 x Per Week/30 Days Discharge Instructions: Apply moisturizing lotion as directed Prim Dressing: Hydrofera Blue Ready Transfer Foam, 8x8 (in/in) 1 x Per Week/30 Days ary Discharge Instructions:  Apply to wound bed as instructed Secondary Dressing: ABD Pad, 8x10 1 x Per Week/30 Days Discharge Instructions: Apply over primary dressing as directed. Com pression Wrap: Kerlix Roll 4.5x3.1 (in/yd) 1 x Per Week/30 Days Discharge Instructions: Apply Kerlix and Coban compression as directed. Com pression Wrap: Coban Self-Adherent Wrap 4x5 (in/yd) 1 x Per Week/30 Days Discharge Instructions: Apply over Kerlix as directed. WOUND #2: - Ankle Wound Laterality: Right, Medial Cleanser: Soap and Water 1 x Per Week/30 Days Discharge Instructions: May shower and wash wound with dial antibacterial soap and water prior to dressing change. Cleanser: Vashe 5.8 (oz) 1 x Per Week/30 Days Discharge Instructions: Cleanse the wound  with Vashe prior to applying a clean dressing using gauze sponges, not tissue or cotton balls. Peri-Wound Care: Triamcinolone 15 (g) 1 x Per Week/30 Days Discharge Instructions: Use triamcinolone 15 (g) as directed Peri-Wound Care: Sween Lotion (Moisturizing lotion) 1 x Per Week/30 Days Discharge Instructions: Apply moisturizing lotion as directed Prim Dressing: Hydrofera Blue Ready Transfer Foam, 8x8 (in/in) 1 x Per Week/30 Days ary Discharge Instructions: Apply to wound bed as instructed Secondary Dressing: ABD Pad, 8x10 1 x Per Week/30 Days Discharge Instructions: Apply over primary dressing as directed. Com pression Wrap: Kerlix Roll 4.5x3.1 (in/yd) 1 x Per Week/30 Days Discharge Instructions: Apply Kerlix and Coban compression as directed. Com pression Wrap: Coban Self-Adherent Wrap 4x5 (in/yd) 1 x Per Week/30 Days Discharge Instructions: Apply over Kerlix as directed. 1. I think this patient's wounds are a combination of venous insufficiency and possible arterial insufficiency 2. Her ABIs in the clinic were only 0.61 bilaterally and her pulses were anemic in her feet but even difficult to feel on the right popliteal and right femoral. We will order formal arterial studies including ABIs TBI's and arterial Dopplers 3. She has an erythematous area on the lateral part of her ankle around the wound which is circular tender in areas. I think this is probably chronic venous stasis but I agree with her primary doctor this could be cellulitis and antibiotics are warranted. She already has been given [place] which is not what I would have used however I think that will suffice. 4. We used Hydrofera Blue TCA and put her in kerlix Coban on the right with instructions not to get this wet. I am going to bring her back for nurse visit on Friday just to can check the condition of the wounds in her leg including the erythematous area on the right lateral malleolus which I have marked  the circumference Electronic Signature(s) Signed: 11/13/2022 3:53:26 PM By: Baltazar Najjar MD Entered By: Baltazar Najjar on 11/13/2022 15:32:21 -------------------------------------------------------------------------------- HxROS Details Patient Name: Date of Service: [name], [place] RNA 11/13/2022 2:00 PM Medical Record Number: [id number] Patient Account Number: [id number] Date of Birth/Sex: Treating RN: 07-24-1923 (89 y.o. Arta Silence Primary Care Provider: Ardean Larsen Other Clinician: Referring Provider: Treating Provider/Extender: Elijio Miles in Treatment: 0 Respiratory Medical History: Positive for: Asthma Cardiovascular Medical History: Positive for: Hypertension Past Medical History NotesMarland Kitchen SAMARRAH, VANHYNING ([id number]) [id number] Page 7 of 8 aortic stenosis Gastrointestinal Medical History: Past Medical History Notes: rectal Ca Integumentary (Skin) Medical History: Past Medical History Notes: left lower leg cellulitis Oncologic Medical History: Negative for: Received Chemotherapy; Received Radiation Past Medical History Notes: 2008 rectal Ca Immunizations Pneumococcal Vaccine: Received Pneumococcal Vaccination: No Implantable Devices No devices added Hospitalization / Surgery History Type of Hospitalization/Surgery 2023 SBO, inguinal hernia, gangrene, FTT- bowel resection tonsillectomy and adenoidectomy Family and Social History  Never smoker; Alcohol Use: Never; Drug Use: No History; Caffeine Use: Never; Financial Concerns: No; Food, Clothing or Shelter Needs: No; Support System Lacking: No; Transportation Concerns: No Electronic Signature(s) Signed: 11/13/2022 3:53:26 PM By: Baltazar Najjar MD Signed: 11/13/2022 4:32:52 PM By: Shawn Stall RN, BSN Entered By: Shawn Stall on 11/13/2022 10:35:12 -------------------------------------------------------------------------------- SuperBill Details Patient  Name: Date of Service: [name], [place] RNA 11/13/2022 Medical Record Number: [id number] Patient Account Number: [id number] Date of Birth/Sex: Treating RN: May 14, 1924 (89 y.o. 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 I10 Essential (primary) hypertension Facility Procedures : CPT4 Code: [id number] Description: 62952 - WOUND CARE VISIT-LEV 5 EST PT Modifier: Quantity: 1 Physician Procedures : CPT4 Code Description Modifier [phone] - WC PHYS LEVEL 4 - NEW PT IBIS, STEVEN ([id number]) [id number] ICD-10 Diagnosis Description 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

## 2022-11-13 NOTE — Progress Notes (Signed)
[place], [name] ([id number]) [id number] Page 1 of 11 Visit Report for 11/13/2022 Allergy List Details Patient Name: Date of ServiceCorinna Faulkner, [name] RNA 11/13/2022 2:00 PM Medical Record Number: [id number] Patient Account Number: [id number] Date of Birth/Sex: Treating RN: 01-Jul-1924 (89 y.o. Arta Silence Primary Care Garris Melhorn: Ardean Larsen Other Clinician: Referring Caroll Cunnington: Treating Lakayla Barrington/Extender: Lamont Snowball, [name] in Treatment: 0 Allergies Active Allergies Levaquin Allergy Notes Electronic Signature(s) Signed: 11/13/2022 4:32:52 PM By: Shawn Stall RN, BSN Entered By: Shawn Stall on 11/13/2022 10:18:09 -------------------------------------------------------------------------------- Arrival Information Details Patient Name: Date of Service: [name], [place] RNA 11/13/2022 2:00 PM Medical Record Number: [id number] Patient Account Number: [id number] Date of Birth/Sex: Treating RN: [birth year] (89 y.o. Arta Silence Primary Care Josephyne Tarter: Ardean Larsen Other Clinician: Referring Azzie Thiem: Treating Mir Fullilove/Extender: Elijio Miles in Treatment: 0 Visit Information Patient Arrived: Ambulatory Arrival Time: 14:20 Accompanied By: self Transfer Assistance: None Patient Identification Verified: Yes Secondary Verification Process Completed: Yes Patient Requires Transmission-Based Precautions: No Patient Has Alerts: No Electronic Signature(s) Signed: 11/13/2022 4:32:52 PM By: Shawn Stall RN, BSN Entered By: Shawn Stall on 11/13/2022 14:20:35 -------------------------------------------------------------------------------- Clinic Level of Care Assessment Details Patient Name: Date of ServiceCorinna Faulkner, LO RNA 11/13/2022 2:00 PM Medical Record Number: [id number] Patient Account Number: [id number] Date of Birth/Sex: Treating RN: May 17, 1924 (89 y.o. Arta Silence Primary Care [facility]: Ardean Larsen Other  Clinician: Referring Ulys Favia: Treating Jalyric Kaestner/Extender: Elijio Miles in Treatment: 0 Clinic Level of Care Assessment Items TOOL 2 Quantity Score X- 1 0 Use when only an EandM is performed on the INITIAL visit ASSESSMENTS - Nursing Assessment / Reassessment X- 1 20 General Physical Exam (combine w/ comprehensive assessment (listed just below) when performed on new pt. evals) X- 1 25 Comprehensive Assessment (HX, ROS, Risk Assessments, Wounds Hx, etc.) [name] ([id number]) [id number] Page 2 of 11 ASSESSMENTS - Wound and Skin A ssessment / Reassessment []  - Simple Wound Assessment / Reassessment - one wound 0 X- 2 5 Complex Wound Assessment / Reassessment - multiple wounds []  - 0 Dermatologic / Skin Assessment (not related to wound area) ASSESSMENTS - Ostomy and/or Continence Assessment and Care []  - 0 Incontinence Assessment and Management []  - 0 Ostomy Care Assessment and Management (repouching, etc.) PROCESS - Coordination of Care []  - 0 Simple Patient / Family Education for ongoing care X- 1 20 Complex (extensive) Patient / Family Education for ongoing care X- 1 10 Staff obtains Chiropractor, Records, T Results / Process Orders est []  - 0 Staff telephones HHA, Nursing Homes / Clarify orders / etc []  - 0 Routine Transfer to another Facility (non-emergent condition) []  - 0 Routine Hospital Admission (non-emergent condition) []  - 0 New Admissions / Manufacturing engineer / Ordering NPWT Apligraf, etc. , []  - 0 Emergency Hospital Admission (emergent condition) []  - 0 Simple Discharge Coordination X- 1 15 Complex (extensive) Discharge Coordination PROCESS - Special Needs []  - 0 Pediatric / Minor Patient Management []  - 0 Isolation Patient Management []  - 0 Hearing / Language / Visual special needs []  - 0 Assessment of Community assistance (transportation, D/C planning, etc.) []  - 0 Additional assistance /  Altered mentation []  - 0 Support Surface(s) Assessment (bed, cushion, seat, etc.) INTERVENTIONS - Wound Cleansing / Measurement X- 1 5 Wound Imaging (photographs - any number of wounds) []  - 0 Wound Tracing (instead of photographs) []  - 0 Simple Wound Measurement - one wound X- 2 5 Complex Wound Measurement - multiple wounds []  - 0 Simple  Wound Cleansing - one wound X- 2 5 Complex Wound Cleansing - multiple wounds INTERVENTIONS - Wound Dressings []  - 0 Small Wound Dressing one or multiple wounds []  - 0 Medium Wound Dressing one or multiple wounds X- 1 20 Large Wound Dressing one or multiple wounds []  - 0 Application of Medications - injection INTERVENTIONS - Miscellaneous []  - 0 External ear exam []  - 0 Specimen Collection (cultures, biopsies, blood, body fluids, etc.) []  - 0 Specimen(s) / Culture(s) sent or taken to Lab for analysis []  - 0 Patient Transfer (multiple staff / Michiel Sites Lift / Similar devices) []  - 0 Simple Staple / Suture removal (25 or less) []  - 0 Complex Staple / Suture removal (26 or more) []  - 0 Hypo / Hyperglycemic Management (close monitor of Blood Glucose) [name] ([id number]) [phone].pdf Page 3 of 11 X- 1 15 Ankle / Brachial Index (ABI) - do not check if billed separately Has the patient been seen at the hospital within the last three years: Yes Total Score: 160 Level Of Care: New/Established - Level 5 Electronic Signature(s) Signed: 11/13/2022 4:32:52 PM By: Shawn Stall RN, BSN Entered By: Shawn Stall on 11/13/2022 15:15:39 -------------------------------------------------------------------------------- Encounter Discharge Information Details Patient Name: Date of Service: [name], [place] RNA 11/13/2022 2:00 PM Medical Record Number: [id number] Patient Account Number: [id number] Date of Birth/Sex: Treating RN: 1924/06/14 (89 y.o. Arta Silence Primary Care Bethany Cumming: Ardean Larsen Other Clinician: Referring  Kindsey Eblin: Treating Aigner Horseman/Extender: Elijio Miles in Treatment: 0 Encounter Discharge Information Items Discharge Condition: Stable Ambulatory Status: Ambulatory Discharge Destination: Home Transportation: Private Auto Accompanied By: self Schedule Follow-up Appointment: Yes Clinical Summary of Care: Electronic Signature(s) Signed: 11/13/2022 4:32:52 PM By: Shawn Stall RN, BSN Entered By: Shawn Stall on 11/13/2022 15:16:06 -------------------------------------------------------------------------------- Lower Extremity Assessment Details Patient Name: Date of Service: [name], LO RNA 11/13/2022 2:00 PM Medical Record Number: [id number] Patient Account Number: [id number] Date of Birth/Sex: Treating RN: 07-Jan-1924 (89 y.o. Arta Silence Primary Care Lylian Sanagustin: Ardean Larsen Other Clinician: Referring Olamae Ferrara: Treating Ariella Voit/Extender: Lamont Snowball, [name] in Treatment: 0 Edema Assessment Assessed: [Left: No] [Right: Yes] Edema: [Left: Ye] [Right: s] Calf Left: Right: Point of Measurement: 31 cm From Medial Instep 30 cm Ankle Left: Right: Point of Measurement: 10 cm From Medial Instep 19 cm Knee To Floor Left: Right: From Medial Instep 41 cm Vascular Assessment Pulses: Dorsalis Pedis Palpable: [Right:Yes] Posterior Tibial Palpable: [Right:Yes] Blood Pressure: [name] ([id number]) [Right:[id number] Page 4 of 11] Brachial: [Right:164] Ankle: [Right:Dorsalis Pedis: 100 0.61] Notes monophasic with doppler. Electronic Signature(s) Signed: 11/13/2022 4:32:52 PM By: Shawn Stall RN, BSN Entered By: Shawn Stall on 11/13/2022 14:33:47 -------------------------------------------------------------------------------- Multi Wound Chart Details Patient Name: Date of Service: [name], [place] RNA 11/13/2022 2:00 PM Medical Record Number: [id number] Patient Account Number: [id number] Date of Birth/Sex:  Treating RN: 06/09/1924 (89 y.o. F) Primary Care Tracie Lindbloom: Ardean Larsen Other Clinician: Referring Annalaya Wile: Treating Keante Urizar/Extender: Lamont Snowball, [name] in Treatment: 0 Vital Signs Height(in): 62 Pulse(bpm): 99 Weight(lbs): 120 Blood Pressure(mmHg): 164/94 Body Mass Index(BMI): 21.9 Temperature(F): 97.7 Respiratory Rate(breaths/min): 20 [1:Photos:] [N/A:N/A] Right, Lateral Ankle Right, Medial Ankle N/A Wound Location: Gradually Appeared Gradually Appeared N/A Wounding Event: Venous Leg Ulcer Venous Leg Ulcer N/A Primary Etiology: Asthma, Hypertension Asthma, Hypertension N/A Comorbid History: 06/08/2022 06/08/2022 N/A Date Acquired: 0 0 N/A Weeks of Treatment: Open Open N/A Wound Status: No No N/A Wound Recurrence: 7x7.5x0.1 0.4x0.4x0.1 N/A Measurements L x W x D (cm) 41.233 0.126 N/A A (cm) :  rea 4.123 0.013 N/A Volume (cm) : Full Thickness Without Exposed Full Thickness Without Exposed N/A Classification: Support Structures Support Structures Medium Medium N/A Exudate Amount: Serosanguineous Serosanguineous N/A Exudate Type: red, brown red, brown N/A Exudate Color: Distinct, outline attached Distinct, outline attached N/A Wound Margin: Large (67-100%) Medium (34-66%) N/A Granulation Amount: Red, Pink Red, Pink N/A Granulation Quality: Small (1-33%) Medium (34-66%) N/A Necrotic Amount: Fat Layer (Subcutaneous Tissue): Yes Fat Layer (Subcutaneous Tissue): Yes N/A Exposed Structures: Fascia: No Fascia: No Tendon: No Tendon: No Muscle: No Muscle: No Joint: No Joint: No Bone: No Bone: No Small (1-33%) Small (1-33%) N/A Epithelialization: Scarring: Yes Scarring: Yes N/A Periwound Skin Texture: Excoriation: No Excoriation: No Induration: No Induration: No Callus: No Callus: No Crepitus: No Crepitus: No Rash: No Rash: No Maceration: No Maceration: No N/A Periwound Skin Moisture: Dry/Scaly: No Dry/Scaly:  No Hemosiderin Staining: Yes Hemosiderin Staining: Yes N/A Periwound Skin Color: Atrophie Blanche: No [address], [name] ([id number]) [id number] Page 5 of 11 Cyanosis: No Cyanosis: No Ecchymosis: No Ecchymosis: No Erythema: No Erythema: No Mottled: No Mottled: No Pallor: No Pallor: No Rubor: No Rubor: No Yes N/A N/A Tenderness on Palpation: Treatment Notes Wound #1 (Ankle) Wound Laterality: Right, Lateral Cleanser Soap and Water Discharge Instruction: May shower and wash wound with dial antibacterial soap and water prior to dressing change. Vashe 5.8 (oz) Discharge Instruction: Cleanse the wound with Vashe prior to applying a clean dressing using gauze sponges, not tissue or cotton balls. Peri-Wound Care Triamcinolone 15 (g) Discharge Instruction: Use triamcinolone 15 (g) as directed Sween Lotion (Moisturizing lotion) Discharge Instruction: Apply moisturizing lotion as directed Topical Primary Dressing Hydrofera Blue Ready Transfer Foam, 8x8 (in/in) Discharge Instruction: Apply to wound bed as instructed Secondary Dressing ABD Pad, 8x10 Discharge Instruction: Apply over primary dressing as directed. Secured With Compression Wrap Kerlix Roll 4.5x3.1 (in/yd) Discharge Instruction: Apply Kerlix and Coban compression as directed. Coban Self-Adherent Wrap 4x5 (in/yd) Discharge Instruction: Apply over Kerlix as directed. Compression Stockings Add-Ons Wound #2 (Ankle) Wound Laterality: Right, Medial Cleanser Soap and Water Discharge Instruction: May shower and wash wound with dial antibacterial soap and water prior to dressing change. Vashe 5.8 (oz) Discharge Instruction: Cleanse the wound with Vashe prior to applying a clean dressing using gauze sponges, not tissue or cotton balls. Peri-Wound Care Triamcinolone 15 (g) Discharge Instruction: Use triamcinolone 15 (g) as directed Sween Lotion (Moisturizing lotion) Discharge  Instruction: Apply moisturizing lotion as directed Topical Primary Dressing Hydrofera Blue Ready Transfer Foam, 8x8 (in/in) Discharge Instruction: Apply to wound bed as instructed Secondary Dressing ABD Pad, 8x10 Discharge Instruction: Apply over primary dressing as directed. Secured With Compression Wrap Kerlix Roll 4.5x3.1 (in/yd) Discharge Instruction: Apply Kerlix and Coban compression as directed. [name], [name] ([id number]) [id number] Page 6 of 11 Coban Self-Adherent Wrap 4x5 (in/yd) Discharge Instruction: Apply over Kerlix as directed. Compression Stockings Add-Ons Electronic Signature(s) Signed: 11/13/2022 3:53:26 PM By: Baltazar Najjar MD Entered By: Baltazar Najjar on 11/13/2022 15:21:53 -------------------------------------------------------------------------------- Multi-Disciplinary Care Plan Details Patient Name: Date of Service: [name], LO RNA 11/13/2022 2:00 PM Medical Record Number: [id number] Patient Account Number: [id number] Date of Birth/Sex: Treating RN: 10/19/1923 (89 y.o. Arta Silence Primary Care Kimbly Eanes: Ardean Larsen Other Clinician: Referring Reshunda Strider: Treating Jerzy Crotteau/Extender: Elijio Miles in Treatment: 0 Active Inactive Orientation to the Wound Care Program Nursing Diagnoses: Knowledge deficit related to the wound healing center program Goals: Patient/caregiver will verbalize understanding of the Wound Healing Center Program Date Initiated: 11/13/2022 Target Resolution Date: 12/14/2022  Goal Status: Active Interventions: Provide education on orientation to the wound center Notes: Pain, Acute or Chronic Nursing Diagnoses: Pain, acute or chronic: actual or potential Potential alteration in comfort, pain Goals: Patient will verbalize adequate pain control and receive pain control interventions during procedures as needed Date Initiated: 11/13/2022 Target Resolution Date: 12/14/2022 Goal Status:  Active Patient/caregiver will verbalize comfort level met Date Initiated: 11/13/2022 Target Resolution Date: 12/14/2022 Goal Status: Active Interventions: Assess comfort goal upon admission Encourage patient to take pain medications as prescribed Treatment Activities: Administer pain control measures as ordered : 11/13/2022 Notes: Venous Leg Ulcer Nursing Diagnoses: Knowledge deficit related to disease process and management Goals: Patient will maintain optimal edema control Date Initiated: 11/13/2022 Target Resolution Date: 12/14/2022 Goal Status: Active [name], [name] ([id number]) [id number] Page 7 of 11 Interventions: Assess peripheral edema status every visit. Compression as ordered Provide education on venous insufficiency Notes: Wound/Skin Impairment Nursing Diagnoses: Knowledge deficit related to ulceration/compromised skin integrity Goals: Patient/caregiver will verbalize understanding of skin care regimen Date Initiated: 11/13/2022 Target Resolution Date: 12/14/2022 Goal Status: Active Interventions: Assess patient/caregiver ability to perform ulcer/skin care regimen upon admission and as needed Assess ulceration(s) every visit Provide education on ulcer and skin care Treatment Activities: Skin care regimen initiated : 11/13/2022 Topical wound management initiated : 11/13/2022 Notes: Electronic Signature(s) Signed: 11/13/2022 4:32:52 PM By: Shawn Stall RN, BSN Entered By: Shawn Stall on 11/13/2022 14:44:57 -------------------------------------------------------------------------------- Pain Assessment Details Patient Name: Date of Service: [name], [place] RNA 11/13/2022 2:00 PM Medical Record Number: [id number] Patient Account Number: [id number] Date of Birth/Sex: Treating RN: 23-Aug-1923 (89 y.o. Arta Silence Primary Care Idalie Canto: Ardean Larsen Other Clinician: Referring Hughey Rittenberry: Treating Amalea Ottey/Extender: Elijio Miles  in Treatment: 0 Active Problems Location of Pain Severity and Description of Pain Patient Has Paino No Site Locations Pain Management and Medication Current Pain Management: Electronic Signature(s) Signed: 11/13/2022 4:32:52 PM By: Shawn Stall RN, BSN Entered By: Shawn Stall on 11/13/2022 14:22:48 [name] ([id number]) [id number] Page 8 of 11 -------------------------------------------------------------------------------- Patient/Caregiver Education Details Patient Name: Date of ServiceCorinna Faulkner, [name] RNA 5/7/2024andnbsp2:00 PM Medical Record Number: [id number] Patient Account Number: [id number] Date of Birth/Gender: Treating RN: [birth year] (89 y.o. Arta Silence Primary Care Physician: Ardean Larsen Other Clinician: Referring Physician: Treating Physician/Extender: Elijio Miles in Treatment: 0 Education Assessment Education Provided To: Patient Education Topics Provided Welcome T [facility] Packet: o Handouts: Welcome T [facility] o Methods: Explain/Verbal Responses: Reinforcements needed Electronic Signature(s) Signed: 11/13/2022 4:32:52 PM By: Shawn Stall RN, BSN Entered By: Shawn Stall on 11/13/2022 14:45:06 -------------------------------------------------------------------------------- Wound Assessment Details Patient Name: Date of Service: [name], LO RNA 11/13/2022 2:00 PM Medical Record Number: [id number] Patient Account Number: [id number] Date of Birth/Sex: Treating RN: Jan 02, 1924 (89 y.o. [name], [name] Primary Care Frazier Balfour: Ardean Larsen Other Clinician: Referring Santrice Muzio: Treating Yoselin Amerman/Extender: Lamont Snowball, [name] in Treatment: 0 Wound Status Wound Number: 1 Primary Etiology: Venous Leg Ulcer Wound Location: Right, Lateral Ankle Wound Status: Open Wounding Event: Gradually Appeared Comorbid History: Asthma, Hypertension Date  Acquired: 06/08/2022 Weeks Of Treatment: 0 Clustered Wound: No Photos Wound Measurements Length: (cm) 7 Width: (cm) 7.5 Depth: (cm) 0.1 Area: (cm) 41.233 Volume: (cm) 4.123 % Reduction in Area: % Reduction in Volume: Epithelialization: Small (1-33%) Tunneling: No Undermining: No Wound Description Classification: Full Thickness Without Exposed Support Structures Passapatanzy, [name] ([id number]) Wound Margin: Distinct, outline attached Exudate Amount: Medium Exudate Type: Serosanguineous Exudate Color: red, brown Foul Odor  After Cleansing: No [id number] Page 9 of 11 Slough/Fibrino Yes Wound Bed Granulation Amount: Large (67-100%) Exposed Structure Granulation Quality: Red, Pink Fascia Exposed: No Necrotic Amount: Small (1-33%) Fat Layer (Subcutaneous Tissue) Exposed: Yes Necrotic Quality: Adherent Slough Tendon Exposed: No Muscle Exposed: No Joint Exposed: No Bone Exposed: No Periwound Skin Texture Texture Color No Abnormalities Noted: No No Abnormalities Noted: No Callus: No Atrophie Blanche: No Crepitus: No Cyanosis: No Excoriation: No Ecchymosis: No Induration: No Erythema: No Rash: No Hemosiderin Staining: Yes Scarring: Yes Mottled: No Pallor: No Moisture Rubor: No No Abnormalities Noted: No Dry / Scaly: No Temperature / Pain Maceration: No Tenderness on Palpation: Yes Treatment Notes Wound #1 (Ankle) Wound Laterality: Right, Lateral Cleanser Soap and Water Discharge Instruction: May shower and wash wound with dial antibacterial soap and water prior to dressing change. Vashe 5.8 (oz) Discharge Instruction: Cleanse the wound with Vashe prior to applying a clean dressing using gauze sponges, not tissue or cotton balls. Peri-Wound Care Triamcinolone 15 (g) Discharge Instruction: Use triamcinolone 15 (g) as directed Sween Lotion (Moisturizing lotion) Discharge Instruction: Apply moisturizing lotion as directed Topical Primary  Dressing Hydrofera Blue Ready Transfer Foam, 8x8 (in/in) Discharge Instruction: Apply to wound bed as instructed Secondary Dressing ABD Pad, 8x10 Discharge Instruction: Apply over primary dressing as directed. Secured With Compression Wrap Kerlix Roll 4.5x3.1 (in/yd) Discharge Instruction: Apply Kerlix and Coban compression as directed. Coban Self-Adherent Wrap 4x5 (in/yd) Discharge Instruction: Apply over Kerlix as directed. Compression Stockings Add-Ons Electronic Signature(s) Signed: 11/13/2022 4:32:52 PM By: Shawn Stall RN, BSN Entered By: Shawn Stall on 11/13/2022 14:36:43 [name] ([id number]) [id number] Page 10 of 11 -------------------------------------------------------------------------------- Wound Assessment Details Patient Name: Date of ServiceCorinna Faulkner, [name] RNA 11/13/2022 2:00 PM Medical Record Number: [id number] Patient Account Number: [id number] Date of Birth/Sex: Treating RN: 04/17/1924 (89 y.o. [name], [name] Primary Care Kimiyo Carmicheal: Ardean Larsen Other Clinician: Referring Roda Lauture: Treating Letha Mirabal/Extender: Lamont Snowball, [name] in Treatment: 0 Wound Status Wound Number: 2 Primary Etiology: Venous Leg Ulcer Wound Location: Right, Medial Ankle Wound Status: Open Wounding Event: Gradually Appeared Comorbid History: Asthma, Hypertension Date Acquired: 06/08/2022 Weeks Of Treatment: 0 Clustered Wound: No Photos Wound Measurements Length: (cm) 0.4 Width: (cm) 0.4 Depth: (cm) 0.1 Area: (cm) 0.126 Volume: (cm) 0.013 % Reduction in Area: % Reduction in Volume: Epithelialization: Small (1-33%) Tunneling: No Undermining: No Wound Description Classification: Full Thickness Without Exposed Suppor Wound Margin: Distinct, outline attached Exudate Amount: Medium Exudate Type: Serosanguineous Exudate Color: red, brown t Structures Foul Odor After Cleansing: No Slough/Fibrino Yes Wound Bed Granulation  Amount: Medium (34-66%) Exposed Structure Granulation Quality: Red, Pink Fascia Exposed: No Necrotic Amount: Medium (34-66%) Fat Layer (Subcutaneous Tissue) Exposed: Yes Necrotic Quality: Adherent Slough Tendon Exposed: No Muscle Exposed: No Joint Exposed: No Bone Exposed: No Periwound Skin Texture Texture Color No Abnormalities Noted: No No Abnormalities Noted: No Callus: No Atrophie Blanche: No Crepitus: No Cyanosis: No Excoriation: No Ecchymosis: No Induration: No Erythema: No Rash: No Hemosiderin Staining: Yes Scarring: Yes Mottled: No Pallor: No Moisture Rubor: No No Abnormalities Noted: No Dry / Scaly: No Maceration: No Treatment Notes Wound #2 (Ankle) Wound Laterality: Right, Medial Cleanser [name] ([id number]) [phone].pdf Page 11 of 11 Soap and Water Discharge Instruction: May shower and wash wound with dial antibacterial soap and water prior to dressing change. Vashe 5.8 (oz) Discharge Instruction: Cleanse the wound with Vashe prior to applying a clean dressing using gauze sponges, not tissue or cotton balls. Peri-Wound Care Triamcinolone 15 (g) Discharge Instruction:  Use triamcinolone 15 (g) as directed Sween Lotion (Moisturizing lotion) Discharge Instruction: Apply moisturizing lotion as directed Topical Primary Dressing Hydrofera Blue Ready Transfer Foam, 8x8 (in/in) Discharge Instruction: Apply to wound bed as instructed Secondary Dressing ABD Pad, 8x10 Discharge Instruction: Apply over primary dressing as directed. Secured With Compression Wrap Kerlix Roll 4.5x3.1 (in/yd) Discharge Instruction: Apply Kerlix and Coban compression as directed. Coban Self-Adherent Wrap 4x5 (in/yd) Discharge Instruction: Apply over Kerlix as directed. Compression Stockings Add-Ons Electronic Signature(s) Signed: 11/13/2022 4:32:52 PM By: Shawn Stall RN, BSN Entered By: Shawn Stall on 11/13/2022  14:37:04 -------------------------------------------------------------------------------- Vitals Details Patient Name: Date of Service: [name], LO RNA 11/13/2022 2:00 PM Medical Record Number: [id number] Patient Account Number: [id number] Date of Birth/Sex: Treating RN: 1924-06-18 (89 y.o. [name], [name] Primary Care Raahil Ong: Ardean Larsen Other Clinician: Referring Essie Gehret: Treating Jenaveve Fenstermaker/Extender: Lamont Snowball, [name] in Treatment: 0 Vital Signs Time Taken: 14:23 Temperature (F): 97.7 Height (in): 62 Pulse (bpm): 99 Source: Stated Respiratory Rate (breaths/min): 20 Weight (lbs): 120 Blood Pressure (mmHg): 164/94 Source: Stated Reference Range: 80 - 120 mg / dl Body Mass Index (BMI): 21.9 Electronic Signature(s) Signed: 11/13/2022 4:32:52 PM By: Shawn Stall RN, BSN Entered By: Shawn Stall on 11/13/2022 14:24:20

## 2022-11-13 NOTE — Progress Notes (Signed)
Goodridge, [name] ([id number]) [id number] Page 1 of 4 Visit Report for 11/13/2022 Abuse Risk Screen Details Patient Name: Date of ServiceCorinna Faulkner, [name] RNA 11/13/2022 2:00 PM Medical Record Number: [id number] Patient Account Number: [id number] Date of Birth/Sex: Treating RN: Nov 23, 1923 (89 y.o. Arta Silence Primary Care Karah Caruthers: Ardean Larsen Other Clinician: Referring Taylon Louison: Treating Wyland Rastetter/Extender: Lamont Snowball, Tacey Ruiz in Treatment: 0 Abuse Risk Screen Items Answer ABUSE RISK SCREEN: Has anyone close to you tried to hurt or harm you recentlyo No Do you feel uncomfortable with anyone in your familyo No Has anyone forced you do things that you didnt want to doo No Electronic Signature(s) Signed: 11/13/2022 4:32:52 PM By: Shawn Stall RN, BSN Entered By: Shawn Stall on 11/13/2022 14:21:44 -------------------------------------------------------------------------------- Activities of Daily Living Details Patient Name: Date of ServiceTorrie Faulkner RNA 11/13/2022 2:00 PM Medical Record Number: [id number] Patient Account Number: [id number] Date of Birth/Sex: Treating RN: 09-26-1923 (89 y.o. Arta Silence Primary Care Laurita Peron: Ardean Larsen Other Clinician: Referring Fernando Torry: Treating Theophile Harvie/Extender: Lamont Snowball, Tacey Ruiz in Treatment: 0 Activities of Daily Living Items Answer Activities of Daily Living (Please select one for each item) Drive Automobile Completely Able T Medications ake Completely Able Use T elephone Completely Able Care for Appearance Completely Able Use T oilet Completely Able Bath / Shower Completely Able Dress Self Completely Able Feed Self Completely Able Walk Completely Able Get In / Out Bed Completely Able Housework Completely Able Prepare Meals Completely Able Handle Money Completely Able Shop for Self Completely Able Electronic Signature(s) Signed: 11/13/2022 4:32:52 PM By:  Shawn Stall RN, BSN Entered By: Shawn Stall on 11/13/2022 14:21:59 -------------------------------------------------------------------------------- Education Screening Details Patient Name: Date of Service: [name], [place] RNA 11/13/2022 2:00 PM Medical Record Number: [id number] Patient Account Number: [id number] Date of Birth/Sex: Treating RN: Jul 09, 1924 (89 y.o. Arta Silence Primary Care Saaya Procell: Ardean Larsen Other Clinician: Referring Khanh Tanori: Treating Martina Brodbeck/Extender: Lamont Snowball, Tacey Ruiz in TreatmentIzora Gala [place], [name] ([id number]) [id number] Page 2 of 4 Primary Learner Assessed: Patient Learning Preferences/Education Level/Primary Language Learning Preference: Explanation, Demonstration, Printed Material Highest Education Level: College or Above Preferred Language: Economist Language Barrier: No Translator Needed: No Memory Deficit: No Emotional Barrier: No Cultural/Religious Beliefs Affecting Medical Care: No Physical Barrier Impaired Vision: No Impaired Hearing: Yes HOH Decreased Hand dexterity: No Knowledge/Comprehension Knowledge Level: High Comprehension Level: High Ability to understand written instructions: High Ability to understand verbal instructions: High Motivation Anxiety Level: Calm Cooperation: Cooperative Education Importance: Acknowledges Need Interest in Health Problems: Asks Questions Perception: Coherent Willingness to Engage in Self-Management High Activities: Readiness to Engage in Self-Management High Activities: Electronic Signature(s) Signed: 11/13/2022 4:32:52 PM By: Shawn Stall RN, BSN Entered By: Shawn Stall on 11/13/2022 14:22:18 -------------------------------------------------------------------------------- Fall Risk Assessment Details Patient Name: Date of Service: [name], LO RNA 11/13/2022 2:00 PM Medical Record Number: [id number] Patient Account Number:  [id number] Date of Birth/Sex: Treating RN: [birth year] (89 y.o. Debara Pickett, Millard.[place]oa Primary Care Malloree Raboin: Ardean Larsen Other Clinician: Referring Dainelle Hun: Treating Caeleigh Prohaska/Extender: Lamont Snowball, Tacey Ruiz in Treatment: 0 Fall Risk Assessment Items Have you had 2 or more falls in the last 12 monthso 0 No Have you had any fall that resulted in injury in the last 12 monthso 0 No FALLS RISK SCREEN History of falling - immediate or within 3 months 0 No Secondary diagnosis (Do you have 2 or more medical diagnoseso) 0 No Ambulatory aid None/bed rest/wheelchair/nurse 0 Yes Crutches/cane/walker 0 No Furniture 0 No Intravenous  therapy Access/Saline/Heparin Lock 0 No Gait/Transferring Normal/ bed rest/ wheelchair 0 No Weak (short steps with or without shuffle, stooped but able to lift head while walking, may seek 10 Yes support from furniture) Impaired (short steps with shuffle, may have difficulty arising from chair, head down, impaired 0 No balance) Mental Status Oriented to own ability 0 Yes Overestimates or forgets limitations 0 No Risk Level: Low Risk Score: 10 LAHNA, STOY ([id number]) [phone] [id number] Page 3 of 4 Electronic Signature(s) -------------------------------------------------------------------------------- Foot Assessment Details Patient Name: Date of ServiceCorinna Faulkner, [name] RNA 11/13/2022 2:00 PM Medical Record Number: [id number] Patient Account Number: [id number] Date of Birth/Sex: Treating RN: 04/20/1924 (89 y.o. Arta Silence Primary Care Yousaf Sainato: Ardean Larsen Other Clinician: Referring Maysel Mccolm: Treating Madelaine Whipple/Extender: Lamont Snowball, [name] in Treatment: 0 Foot Assessment Items Site Locations + = Sensation present, - = Sensation absent, C = Callus, U = Ulcer R = Redness, W = Warmth, M = Maceration, PU = Pre-ulcerative lesion F = Fissure, S = Swelling, D = Dryness Assessment Right: Left: Other  Deformity: No No Prior Foot Ulcer: No No Prior Amputation: No No Charcot Joint: No No Ambulatory Status: Ambulatory Without Help Gait: Steady Electronic Signature(s) Signed: 11/13/2022 4:32:52 PM By: Shawn Stall RN, BSN Entered By: Shawn Stall on 11/13/2022 14:27:49 -------------------------------------------------------------------------------- Nutrition Risk Screening Details Patient Name: Date of ServiceCorinna Faulkner, [place] RNA 11/13/2022 2:00 PM Medical Record Number: [id number] Patient Account Number: [id number] Date of Birth/Sex: Treating RN: April 30, 1924 (89 y.o. Arta Silence Primary Care Dior Stepter: Ardean Larsen Other Clinician: Referring Tierria Watson: Treating Chevella Pearce/Extender: Lamont Snowball, [name] in Treatment: 0 Height (in): Weight (lbs): Body Mass Index (BMI): ASHI, KRITZ ([id number]) [phone] [id number] Page 4 of 4 Nutrition Risk Screening Items Score Screening NUTRITION RISK SCREEN: I have an illness or condition that made me change the kind and/or amount of food I eat 2 Yes I eat fewer than two meals per day 0 No I eat few fruits and vegetables, or milk products 0 No I have three or more drinks of beer, liquor or wine almost every day 0 No I have tooth or mouth problems that make it hard for me to eat 0 No I don't always have enough money to buy the food I need 0 No I eat alone most of the time 0 No I take three or more different prescribed or over-the-counter drugs a day 0 No Without wanting to, I have lost or gained 10 pounds in the last six months 0 No I am not always physically able to shop, cook and/or feed myself 0 No Nutrition Protocols Good Risk Protocol 0 No interventions needed Moderate Risk Protocol High Risk Proctocol Risk Level: Good Risk Score: 2 Electronic Signature(s) Signed: 11/13/2022 4:32:52 PM By: Shawn Stall RN, BSN Entered By: Shawn Stall on 11/13/2022 14:22:43

## 2022-11-16 ENCOUNTER — Encounter (HOSPITAL_BASED_OUTPATIENT_CLINIC_OR_DEPARTMENT_OTHER): Payer: 59 | Admitting: Internal Medicine

## 2022-11-16 DIAGNOSIS — I87331 Chronic venous hypertension (idiopathic) with ulcer and inflammation of right lower extremity: Secondary | ICD-10-CM | POA: Diagnosis not present

## 2022-11-16 NOTE — Progress Notes (Signed)
[name] ([id number]) [id number] Page 1 of 6 Visit Report for 11/16/2022 Arrival Information Details Patient Name: Date of ServiceCorinna Faulkner, [name] RNA 11/16/2022 10:00 A M Medical Record Number: [id number] Patient Account Number: [id number] Date of Birth/Sex: Treating RN: 1924/04/19 (89 y.o. F) Primary Care [name]: [name] Other Clinician: Referring [name]: Treating [name]/Extender: [name], [name] in Treatment: 0 Visit Information History Since Last Visit Added or deleted any medications: No Patient Arrived: Ambulatory Any new allergies or adverse reactions: No Arrival Time: 10:14 Had a fall or experienced change in No Accompanied By: daughter activities of daily living that may affect Transfer Assistance: None risk of falls: Patient Identification Verified: Yes Signs or symptoms of abuse/neglect since last visito No Secondary Verification Process Completed: Yes Hospitalized since last visit: No Patient Requires Transmission-Based Precautions: No Implantable device outside of the clinic excluding No Patient Has Alerts: No cellular tissue based products placed in the center since last visit: Has Dressing in Place as Prescribed: Yes Pain Present Now: No Electronic Signature(s) Signed: 11/16/2022 1:52:56 PM By: [name] Entered By: [name] on 11/16/2022 10:14:24 -------------------------------------------------------------------------------- Clinic Level of Care Assessment Details Patient Name: Date of ServiceTorrie Faulkner RNA 11/16/2022 10:00 A M Medical Record Number: [id number] Patient Account Number: [id number] Date of Birth/Sex: Treating RN: 30-Nov-1923 (89 y.o. [name] Primary Care [name]: [name] Other Clinician: Referring Satonya Lux: Treating [name]/Extender: [name], [name] in Treatment: 0 Clinic Level of Care Assessment Items TOOL 3 Quantity  Score X- 1 0 Use when EandM and Procedure is performed on FOLLOW-UP visit ASSESSMENTS - Nursing Assessment / Reassessment X- 1 10 Reassessment of Co-morbidities (includes updates in patient status) X- 1 5 Reassessment of Adherence to Treatment Plan ASSESSMENTS - Wound and Skin Assessment / Reassessment []  - Points for Wound Assessment can only be taken for a new wound of unknown or different etiology and a procedure is 0 NOT performed to that wound []  - 0 Simple Wound Assessment / Reassessment - one wound []  - 0 Complex Wound Assessment / Reassessment - multiple wounds []  - 0 Dermatologic / Skin Assessment (not related to wound area) ASSESSMENTS - Focused Assessment []  - 0 Circumferential Edema Measurements - multi extremities []  - 0 Nutritional Assessment / Counseling / Intervention []  - 0 Lower Extremity Assessment (monofilament, tuning fork, pulses) []  - 0 Peripheral Arterial Disease Assessment (using hand held doppler) ASSESSMENTS - Ostomy and/or Continence Assessment and Care []  - 0 Incontinence Assessment and Management []  - 0 Ostomy Care Assessment and Management (repouching, etc.) [name] ([id number]) [id number] Page 2 of 6 PROCESS - Coordination of Care []  - Points for Discharge Coordination can only be taken for a new wound of unknown or different etiology and a 0 procedure is NOT performed to that wound []  - 0 Simple Patient / Family Education for ongoing care []  - 0 Complex (extensive) Patient / Family Education for ongoing care []  - 0 Staff obtains Chiropractor, Records, T Results / Process Orders est []  - 0 Staff telephones HHA, Nursing Homes / Clarify orders / etc []  - 0 Routine Transfer to another Facility (non-emergent condition) []  - 0 Routine Hospital Admission (non-emergent condition) []  - 0 New Admissions / Manufacturing engineer / Ordering NPWT Apligraf, etc. , []  - 0 Emergency Hospital Admission (emergent  condition) []  - 0 Simple Discharge Coordination []  - 0 Complex (extensive) Discharge Coordination PROCESS - Special Needs []  - 0 Pediatric / Minor Patient Management []  - 0 Isolation Patient  Management []  - 0 Hearing / Language / Visual special needs []  - 0 Assessment of Community assistance (transportation, D/C planning, etc.) []  - 0 Additional assistance / Altered mentation []  - 0 Support Surface(s) Assessment (bed, cushion, seat, etc.) INTERVENTIONS - Wound Cleansing / Measurement []  - Points for Wound Cleaning / Measurement, Wound Dressing, Specimen Collection and Specimen taken to lab can 0 only be taken for a new wound of unknown or different etiology and a procedure is NOT performed to that wound []  - 0 Simple Wound Cleansing - one wound []  - 0 Complex Wound Cleansing - multiple wounds []  - 0 Wound Imaging (photographs - any number of wounds) []  - 0 Wound Tracing (instead of photographs) []  - 0 Simple Wound Measurement - one wound []  - 0 Complex Wound Measurement - multiple wounds INTERVENTIONS - Wound Dressings []  - 0 Small Wound Dressing one or multiple wounds X- 2 15 Medium Wound Dressing one or multiple wounds []  - 0 Large Wound Dressing one or multiple wounds INTERVENTIONS - Miscellaneous []  - 0 External ear exam []  - 0 Specimen Collection (cultures, biopsies, blood, body fluids, etc.) []  - 0 Specimen(s) / Culture(s) sent or taken to Lab for analysis []  - 0 Patient Transfer (multiple staff / Nurse, adult / Similar devices) []  - 0 Simple Staple / Suture removal (25 or less) []  - 0 Complex Staple / Suture removal (26 or more) []  - 0 Hypo / Hyperglycemic Management (close monitor of Blood Glucose) []  - 0 Ankle / Brachial Index (ABI) - do not check if billed separately X- 1 5 Vital Signs Has the patient been seen at the hospital within the last three years: Yes Total Score: 50 Level Of Care: New/Established - Level 2 [name] ([id number])  [id number] Page 3 of 6 Electronic Signature(s) Signed: 11/16/2022 12:57:43 PM By: Julie Schwalbe RN Entered By: [name] on 11/16/2022 12:48:42 -------------------------------------------------------------------------------- Encounter Discharge Information Details Patient Name: Date of Service: [name], [place] RNA 11/16/2022 10:00 A M Medical Record Number: [id number] Patient Account Number: [id number] Date of Birth/Sex: Treating RN: 09-23-1923 (89 y.o. [name] Primary Care Betania Dizon: [name] Other Clinician: Referring Manuella Blackson: Treating Artavis Cowie/Extender: [name]: 0 Encounter Discharge Information Items Discharge Condition: Stable Ambulatory Status: Ambulatory Discharge Destination: Home Transportation: Private Auto Accompanied By: self Schedule Follow-up Appointment: Yes Clinical Summary of Care: Patient Declined Electronic Signature(s) Signed: 11/16/2022 12:57:43 PM By: Julie Schwalbe RN Entered By: [name] on 11/16/2022 12:49:27 -------------------------------------------------------------------------------- Patient/Caregiver Education Details Patient Name: Date of Service: [name], [place] RNA 5/10/2024andnbsp10:00 A M Medical Record Number: [id number] Patient Account Number: [id number] Date of Birth/Gender: Treating RN: 07-29-1923 (89 y.o. [name] Primary Care Physician: [name] Other Clinician: Referring Physician: Treating Physician/Extender: [name]: 0 Education Assessment Education Provided To: Patient Education Topics Provided Wound/Skin Impairment: Methods: Explain/Verbal Responses: Return demonstration correctly Electronic Signature(s) Signed: 11/16/2022 12:57:43 PM By: Julie Schwalbe RN Entered By: [name] on 11/16/2022  12:48:59 -------------------------------------------------------------------------------- Wound Assessment Details Patient Name: Date of Service: [name], [place] RNA 11/16/2022 10:00 A M Medical Record Number: [id number] Patient Account Number: [id number] Date of Birth/Sex: Treating RN: 02-12-1924 (89 y.o. F) Primary Care Zahid Carneiro: [name] Other Clinician: Referring Jevonte Clanton: Treating Charley Miske/Extender: [name], [name] in Treatment: 0 Wound Status Wound Number: 1 Primary Etiology: Venous Leg Ulcer [name] ([id number]) [phone].pdf Page 4 of 6 Wound Location: Right, Lateral Ankle Wound Status: Open Wounding Event: Gradually Appeared Date  Acquired: 06/08/2022 Faulkner Of Treatment: 0 Clustered Wound: No Wound Measurements Length: (cm) 7 Width: (cm) 7.5 Depth: (cm) 0.1 Area: (cm) 41.233 Volume: (cm) 4.123 % Reduction in Area: 0% % Reduction in Volume: 0% Wound Description Classification: Full Thickness Without Exposed Suppor Exudate Amount: Medium Exudate Type: Serosanguineous Exudate Color: red, brown t Structures Periwound Skin Texture Texture Color No Abnormalities Noted: No No Abnormalities Noted: No Moisture No Abnormalities Noted: No Treatment Notes Wound #1 (Ankle) Wound Laterality: Right, Lateral Cleanser Soap and Water Discharge Instruction: May shower and wash wound with dial antibacterial soap and water prior to dressing change. Vashe 5.8 (oz) Discharge Instruction: Cleanse the wound with Vashe prior to applying a clean dressing using gauze sponges, not tissue or cotton balls. Peri-Wound Care Triamcinolone 15 (g) Discharge Instruction: Use triamcinolone 15 (g) as directed Sween Lotion (Moisturizing lotion) Discharge Instruction: Apply moisturizing lotion as directed Topical Primary Dressing Hydrofera Blue Ready Transfer Foam, 8x8 (in/in) Discharge Instruction: Apply to wound bed as  instructed Secondary Dressing ABD Pad, 8x10 Discharge Instruction: Apply over primary dressing as directed. Secured With Compression Wrap Kerlix Roll 4.5x3.1 (in/yd) Discharge Instruction: Apply Kerlix and Coban compression as directed. Coban Self-Adherent Wrap 4x5 (in/yd) Discharge Instruction: Apply over Kerlix as directed. Compression Stockings Add-Ons Electronic Signature(s) Signed: 11/16/2022 1:52:56 PM By: [name] Entered By: [name] on 11/16/2022 10:14:53 -------------------------------------------------------------------------------- Wound Assessment Details Patient Name: Date of Service: [name], [place] RNA 11/16/2022 10:00 A Jiles Garter, [name] ([id number]) [phone].pdf Page 5 of 6 Medical Record Number: [id number] Patient Account Number: [id number] Date of Birth/Sex: Treating RN: 1923-11-11 (89 y.o. F) Primary Care Khristine Verno: [name] Other Clinician: Referring Alean Kromer: Treating Miaa Latterell/Extender: [name], [name] in Treatment: 0 Wound Status Wound Number: 2 Primary Etiology: Venous Leg Ulcer Wound Location: Right, Medial Ankle Wound Status: Open Wounding Event: Gradually Appeared Date Acquired: 06/08/2022 Faulkner Of Treatment: 0 Clustered Wound: No Wound Measurements Length: (cm) 0.4 Width: (cm) 0.4 Depth: (cm) 0.1 Area: (cm) 0.126 Volume: (cm) 0.013 % Reduction in Area: 0% % Reduction in Volume: 0% Wound Description Classification: Full Thickness Without Exposed Suppor Exudate Amount: Medium Exudate Type: Serosanguineous Exudate Color: red, brown t Structures Periwound Skin Texture Texture Color No Abnormalities Noted: No No Abnormalities Noted: No Moisture No Abnormalities Noted: No Treatment Notes Wound #2 (Ankle) Wound Laterality: Right, Medial Cleanser Soap and Water Discharge Instruction: May shower and wash wound with dial antibacterial soap and water prior to dressing  change. Vashe 5.8 (oz) Discharge Instruction: Cleanse the wound with Vashe prior to applying a clean dressing using gauze sponges, not tissue or cotton balls. Peri-Wound Care Triamcinolone 15 (g) Discharge Instruction: Use triamcinolone 15 (g) as directed Sween Lotion (Moisturizing lotion) Discharge Instruction: Apply moisturizing lotion as directed Topical Primary Dressing Hydrofera Blue Ready Transfer Foam, 8x8 (in/in) Discharge Instruction: Apply to wound bed as instructed Secondary Dressing ABD Pad, 8x10 Discharge Instruction: Apply over primary dressing as directed. Secured With Compression Wrap Kerlix Roll 4.5x3.1 (in/yd) Discharge Instruction: Apply Kerlix and Coban compression as directed. Coban Self-Adherent Wrap 4x5 (in/yd) Discharge Instruction: Apply over Kerlix as directed. Compression Stockings Add-Ons Electronic Signature(s) Signed: 11/16/2022 1:52:56 PM By: [name] Yankey,Signed: 11/16/2022 1:52:56 PM By: Domingo Dimes ([id number]) [id number] Page 6 of 6 Entered By: [name] on 11/16/2022 10:14:54 -------------------------------------------------------------------------------- Vitals Details Patient Name: Date of ServiceTorrie Faulkner RNA 11/16/2022 10:00 A M Medical Record Number: [id number] Patient Account Number: [id number] Date of Birth/Sex: Treating RN: 1924/01/13 (89 y.o. F) Primary Care Sarrah Fiorenza: [name]  Other Clinician: Referring Faheem Ziemann: Treating Oluwadara Gorman/Extender: [name], [name] in Treatment: 0 Vital Signs Time Taken: 10:14 Temperature (F): 97.7 Height (in): 62 Pulse (bpm): 99 Weight (lbs): 120 Respiratory Rate (breaths/min): 20 Body Mass Index (BMI): 21.9 Blood Pressure (mmHg): 164/94 Reference Range: 80 - 120 mg / dl Electronic Signature(s) Signed: 11/16/2022 1:52:56 PM By: [name] Entered By: [name] on 11/16/2022 10:14:43

## 2022-11-19 NOTE — Progress Notes (Signed)
[name] ([id number]) [id number] Page 1 of 1 Visit Report for 11/16/2022 SuperBill Details Patient Name: Date of ServiceTorrie Faulkner RNA 11/16/2022 Medical Record Number: [id number] Patient Account Number: [id number] Date of Birth/Sex: Treating RN: Nov 20, 1923 (89 y.o. 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

## 2022-11-22 ENCOUNTER — Encounter (HOSPITAL_BASED_OUTPATIENT_CLINIC_OR_DEPARTMENT_OTHER): Payer: 59 | Admitting: Internal Medicine

## 2022-11-22 DIAGNOSIS — I87331 Chronic venous hypertension (idiopathic) with ulcer and inflammation of right lower extremity: Secondary | ICD-10-CM

## 2022-11-22 DIAGNOSIS — L97311 Non-pressure chronic ulcer of right ankle limited to breakdown of skin: Secondary | ICD-10-CM | POA: Diagnosis not present

## 2022-11-22 DIAGNOSIS — I10 Essential (primary) hypertension: Secondary | ICD-10-CM | POA: Diagnosis not present

## 2022-11-22 DIAGNOSIS — I70233 Atherosclerosis of native arteries of right leg with ulceration of ankle: Secondary | ICD-10-CM | POA: Diagnosis not present

## 2022-11-27 ENCOUNTER — Encounter (HOSPITAL_BASED_OUTPATIENT_CLINIC_OR_DEPARTMENT_OTHER): Payer: 59 | Admitting: General Surgery

## 2022-11-29 ENCOUNTER — Ambulatory Visit (HOSPITAL_COMMUNITY)
Admission: RE | Admit: 2022-11-29 | Discharge: 2022-11-29 | Disposition: A | Discharge status: Home / Self Care | Payer: 59 | Source: Ambulatory Visit | Attending: Vascular Surgery | Admitting: Vascular Surgery

## 2022-11-29 ENCOUNTER — Other Ambulatory Visit (HOSPITAL_COMMUNITY): Payer: Self-pay | Admitting: Internal Medicine

## 2022-11-29 DIAGNOSIS — I739 Peripheral vascular disease, unspecified: Secondary | ICD-10-CM

## 2022-11-29 LAB — VAS US ABI WITH/WO TBI
Left ABI: 1.25
Right ABI: 0.96

## 2022-12-04 ENCOUNTER — Encounter (HOSPITAL_BASED_OUTPATIENT_CLINIC_OR_DEPARTMENT_OTHER): Payer: 59 | Admitting: Internal Medicine

## 2022-12-04 DIAGNOSIS — I10 Essential (primary) hypertension: Secondary | ICD-10-CM | POA: Diagnosis not present

## 2022-12-04 DIAGNOSIS — I70233 Atherosclerosis of native arteries of right leg with ulceration of ankle: Secondary | ICD-10-CM

## 2022-12-04 DIAGNOSIS — I87331 Chronic venous hypertension (idiopathic) with ulcer and inflammation of right lower extremity: Secondary | ICD-10-CM | POA: Diagnosis not present

## 2022-12-04 DIAGNOSIS — L97311 Non-pressure chronic ulcer of right ankle limited to breakdown of skin: Secondary | ICD-10-CM

## 2022-12-11 ENCOUNTER — Encounter (HOSPITAL_BASED_OUTPATIENT_CLINIC_OR_DEPARTMENT_OTHER): Payer: 59 | Attending: Internal Medicine | Admitting: Internal Medicine

## 2022-12-11 DIAGNOSIS — I87331 Chronic venous hypertension (idiopathic) with ulcer and inflammation of right lower extremity: Secondary | ICD-10-CM | POA: Insufficient documentation

## 2022-12-11 DIAGNOSIS — I70233 Atherosclerosis of native arteries of right leg with ulceration of ankle: Secondary | ICD-10-CM | POA: Diagnosis not present

## 2022-12-11 DIAGNOSIS — J45909 Unspecified asthma, uncomplicated: Secondary | ICD-10-CM | POA: Diagnosis not present

## 2022-12-11 DIAGNOSIS — L97311 Non-pressure chronic ulcer of right ankle limited to breakdown of skin: Secondary | ICD-10-CM | POA: Diagnosis present

## 2022-12-11 DIAGNOSIS — I10 Essential (primary) hypertension: Secondary | ICD-10-CM | POA: Diagnosis not present

## 2022-12-11 DIAGNOSIS — Z85048 Personal history of other malignant neoplasm of rectum, rectosigmoid junction, and anus: Secondary | ICD-10-CM | POA: Diagnosis not present

## 2022-12-18 ENCOUNTER — Encounter (HOSPITAL_BASED_OUTPATIENT_CLINIC_OR_DEPARTMENT_OTHER): Payer: 59 | Admitting: Internal Medicine

## 2022-12-18 DIAGNOSIS — I87331 Chronic venous hypertension (idiopathic) with ulcer and inflammation of right lower extremity: Secondary | ICD-10-CM | POA: Diagnosis not present

## 2022-12-18 DIAGNOSIS — I70233 Atherosclerosis of native arteries of right leg with ulceration of ankle: Secondary | ICD-10-CM

## 2022-12-18 DIAGNOSIS — I10 Essential (primary) hypertension: Secondary | ICD-10-CM

## 2022-12-18 DIAGNOSIS — L97311 Non-pressure chronic ulcer of right ankle limited to breakdown of skin: Secondary | ICD-10-CM | POA: Diagnosis not present

## 2022-12-19 NOTE — Progress Notes (Signed)
[name] ([id number]) [id number] Page 1 of 10 Visit Report for 12/18/2022 Arrival Information Details Patient Name: Date of ServiceCorinna Faulkner, [name] RNA 12/18/2022 3:30 PM Medical Record Number: [id number] Patient Account Number: [id number] Date of Birth/Sex: Treating RN: 1924/06/18 (89 y.o. [name] Primary Care Jayko Voorhees: Ardean Larsen Other Clinician: Referring [name]: Treating [name]/Extender: [name]: 5 Visit Information History Since Last Visit Added or deleted any medications: No Patient Arrived: Ambulatory Any new allergies or adverse reactions: No Arrival Time: 15:50 Had a fall or experienced change in No Accompanied By: daughter activities of daily living that may affect Transfer Assistance: None risk of falls: Patient Identification Verified: Yes Signs or symptoms of abuse/neglect since last visito No Secondary Verification Process Completed: Yes Hospitalized since last visit: No Patient Requires Transmission-Based Precautions: No Implantable device outside of the clinic excluding No Patient Has Alerts: Yes cellular tissue based products placed in the center Patient Alerts: ABI R 0.96 (11/29/22) since last visit: ABI L 1.25 (11/29/22) Has Dressing in Place as Prescribed: Yes Has Compression in Place as Prescribed: Yes Pain Present Now: No Electronic Signature(s) Signed: 12/18/2022 4:49:20 PM By: Redmond Pulling RN, BSN Entered By: Redmond Pulling on 12/18/2022 15:50:37 -------------------------------------------------------------------------------- Clinic Level of Care Assessment Details Patient Name: Date of ServiceCorinna Faulkner, [name] RNA 12/18/2022 3:30 PM Medical Record Number: [id number] Patient Account Number: [id number] Date of Birth/Sex: Treating RN: 20-Feb-1924 (89 y.o. [name] Primary Care Lyndzee Kliebert: Ardean Larsen Other Clinician: Referring [name]: Treating  [name]/Extender: [name], [name] in Treatment: 5 Clinic Level of Care Assessment Items TOOL 4 Quantity Score X- 1 0 Use when only an EandM is performed on FOLLOW-UP visit ASSESSMENTS - Nursing Assessment / Reassessment X- 1 10 Reassessment of Co-morbidities (includes updates in patient status) X- 1 5 Reassessment of Adherence to Treatment Plan ASSESSMENTS - Wound and Skin A ssessment / Reassessment []  - 0 Simple Wound Assessment / Reassessment - one wound X- 2 5 Complex Wound Assessment / Reassessment - multiple wounds []  - 0 Dermatologic / Skin Assessment (not related to wound area) ASSESSMENTS - Focused Assessment X- 1 5 Circumferential Edema Measurements - multi extremities []  - 0 Nutritional Assessment / Counseling / Intervention []  - 0 Lower Extremity Assessment (monofilament, tuning fork, pulses) []  - 0 Peripheral Arterial Disease Assessment (using hand held doppler) ASSESSMENTS - Ostomy and/or Continence Assessment and Care []  - 0 Incontinence Assessment and Management []  - 0 Ostomy Care Assessment and Management (repouching, etc.) PROCESS - Coordination of Care [name], [name] ([id number]) [id number] Page 2 of 10 X- 1 15 Simple Patient / Family Education for ongoing care []  - 0 Complex (extensive) Patient / Family Education for ongoing care X- 1 10 Staff obtains Chiropractor, Records, T Results / Process Orders est []  - 0 Staff telephones HHA, Nursing Homes / Clarify orders / etc []  - 0 Routine Transfer to another Facility (non-emergent condition) []  - 0 Routine Hospital Admission (non-emergent condition) []  - 0 New Admissions / Manufacturing engineer / Ordering NPWT Apligraf, etc. , []  - 0 Emergency Hospital Admission (emergent condition) []  - 0 Simple Discharge Coordination []  - 0 Complex (extensive) Discharge Coordination PROCESS - Special Needs []  - 0 Pediatric / Minor Patient Management []  -  0 Isolation Patient Management []  - 0 Hearing / Language / Visual special needs []  - 0 Assessment of Community assistance (transportation, D/C planning, etc.) []  - 0 Additional assistance / Altered mentation []  - 0 Support Surface(s) Assessment (bed, cushion,  seat, etc.) INTERVENTIONS - Wound Cleansing / Measurement []  - 0 Simple Wound Cleansing - one wound X- 2 5 Complex Wound Cleansing - multiple wounds X- 1 5 Wound Imaging (photographs - any number of wounds) []  - 0 Wound Tracing (instead of photographs) []  - 0 Simple Wound Measurement - one wound X- 2 5 Complex Wound Measurement - multiple wounds INTERVENTIONS - Wound Dressings []  - 0 Small Wound Dressing one or multiple wounds X- 1 15 Medium Wound Dressing one or multiple wounds []  - 0 Large Wound Dressing one or multiple wounds []  - 0 Application of Medications - topical []  - 0 Application of Medications - injection INTERVENTIONS - Miscellaneous []  - 0 External ear exam []  - 0 Specimen Collection (cultures, biopsies, blood, body fluids, etc.) []  - 0 Specimen(s) / Culture(s) sent or taken to Lab for analysis []  - 0 Patient Transfer (multiple staff / Nurse, adult / Similar devices) []  - 0 Simple Staple / Suture removal (25 or less) []  - 0 Complex Staple / Suture removal (26 or more) []  - 0 Hypo / Hyperglycemic Management (close monitor of Blood Glucose) []  - 0 Ankle / Brachial Index (ABI) - do not check if billed separately X- 1 5 Vital Signs Has the patient been seen at the hospital within the last three years: Yes Total Score: 100 Level Of Care: New/Established - Level 3 Electronic Signature(s) Signed: 12/18/2022 4:49:20 PM By: Redmond Pulling RN, BSN [place], [name] ([id number]) [phone].pdf Page 3 of 10 Entered By: Redmond Pulling on 12/18/2022 16:30:29 -------------------------------------------------------------------------------- Encounter Discharge Information Details Patient  Name: Date of Service: [name], [name] RNA 12/18/2022 3:30 PM Medical Record Number: [id number] Patient Account Number: [id number] Date of Birth/Sex: Treating RN: 1923-08-27 (89 y.o. [name] Primary Care [name]: Ardean Larsen Other Clinician: Referring Jadzia Ibsen: Treating Lemoyne Scarpati/Extender: [name]: 5 Encounter Discharge Information Items Discharge Condition: Stable Ambulatory Status: Ambulatory Discharge Destination: Home Transportation: Private Auto Accompanied By: daughter Schedule Follow-up Appointment: Yes Clinical Summary of Care: Patient Declined Electronic Signature(s) Signed: 12/18/2022 4:49:20 PM By: Redmond Pulling RN, BSN Entered By: Redmond Pulling on 12/18/2022 16:31:10 -------------------------------------------------------------------------------- Lower Extremity Assessment Details Patient Name: Date of Service: [name], [place] RNA 12/18/2022 3:30 PM Medical Record Number: [id number] Patient Account Number: [id number] Date of Birth/Sex: Treating RN: [birth year] (89 y.o. [name] Primary Care Cola Gane: Ardean Larsen Other Clinician: Referring Gizelle Whetsel: Treating Bailei Buist/Extender: [name], [name] in Treatment: 5 Edema Assessment Assessed: [Left: No] [Right: No] Edema: [Left: Ye] [Right: s] Calf Left: Right: Point of Measurement: 31 cm From Medial Instep 28.8 cm Ankle Left: Right: Point of Measurement: 10 cm From Medial Instep 18.5 cm Vascular Assessment Pulses: Dorsalis Pedis Palpable: [Right:Yes] Electronic Signature(s) Signed: 12/18/2022 4:49:20 PM By: Redmond Pulling RN, BSN Entered By: Redmond Pulling on 12/18/2022 15:57:04 -------------------------------------------------------------------------------- Multi Wound Chart Details Patient Name: Date of Service: [name], [place] RNA 12/18/2022 3:30 PM Medical Record Number: [id number] Patient Account Number: [id number] Date of Birth/Sex:  Treating RN: 02/21/1924 (89 y.o. F) Primary Care Maley Venezia: Ardean Larsen Other Clinician: Referring Farrell Pantaleo: Treating Tara Rud/Extender: Levander Campion [place], [name] ([id number]) [id number] Page 4 of 10 Weeks in Treatment: 5 Vital Signs Height(in): 62 Pulse(bpm): 79 Weight(lbs): 120 Blood Pressure(mmHg): 166/68 Body Mass Index(BMI): 21.9 Temperature(F): 98.2 Respiratory Rate(breaths/min): 18 [1:Photos:] [N/A:N/A] Right, Lateral Ankle Right, Medial Ankle N/A Wound Location: Gradually Appeared Gradually Appeared N/A Wounding Event: Venous Leg Ulcer Venous Leg Ulcer N/A Primary Etiology: Asthma, Hypertension Asthma, Hypertension N/A Comorbid History:  06/08/2022 06/08/2022 N/A Date Acquired: 5 5 N/A Weeks of Treatment: Open Open N/A Wound Status: No No N/A Wound Recurrence: 0.1x0.1x0.1 1x1x0.1 N/A Measurements L x W x D (cm) 0.008 0.785 N/A A (cm) : rea 0.001 0.079 N/A Volume (cm) : 100.00% -523.00% N/A % Reduction in A rea: 100.00% -507.70% N/A % Reduction in Volume: Full Thickness Without Exposed Full Thickness Without Exposed N/A Classification: Support Structures Support Structures None Present Medium N/A Exudate Amount: N/A Serosanguineous N/A Exudate Type: N/A red, brown N/A Exudate Color: None Present (0%) None Present (0%) N/A Granulation Amount: None Present (0%) Large (67-100%) N/A Necrotic Amount: Fat Layer (Subcutaneous Tissue): Yes Fat Layer (Subcutaneous Tissue): Yes N/A Exposed Structures: Fascia: No Fascia: No Tendon: No Tendon: No Muscle: No Muscle: No Joint: No Joint: No Bone: No Bone: No Large (67-100%) None N/A Epithelialization: Excoriation: No Excoriation: No N/A Periwound Skin Texture: Induration: No Induration: No Callus: No Callus: No Crepitus: No Crepitus: No Rash: No Rash: No Scarring: No Scarring: No Maceration: Yes Maceration: No N/A Periwound Skin  Moisture: Dry/Scaly: Yes Dry/Scaly: No Ecchymosis: Yes Atrophie Blanche: No N/A Periwound Skin Color: Atrophie Blanche: No Cyanosis: No Cyanosis: No Ecchymosis: No Erythema: No Erythema: No Hemosiderin Staining: No Hemosiderin Staining: No Mottled: No Mottled: No Pallor: No Pallor: No Rubor: No Rubor: No N/A No Abnormality N/A Temperature: Treatment Notes Electronic Signature(s) Signed: 12/18/2022 4:34:44 PM By: Geralyn Corwin DO Entered By: Geralyn Corwin on 12/18/2022 16:25:15 -------------------------------------------------------------------------------- Multi-Disciplinary Care Plan Details Patient Name: Date of Service: [name], [place] RNA 12/18/2022 3:30 PM Medical Record Number: [id number] Patient Account Number: [id number] Date of Birth/Sex: Treating RN: February 06, 1924 (89 y.o. [address] [place], [place] ([id number].pdf Page 5 of 10 Primary Care Marika Mahaffy: Ardean Larsen Other Clinician: Referring Cross Jorge: Treating Jamonica Schoff/Extender: [name], [name] in Treatment: 5 Active Inactive Pain, Acute or Chronic Nursing Diagnoses: Pain, acute or chronic: actual or potential Potential alteration in comfort, pain Goals: Patient will verbalize adequate pain control and receive pain control interventions during procedures as needed Date Initiated: 11/13/2022 Date Inactivated: 12/18/2022 Target Resolution Date: 02/06/2023 Goal Status: Met Patient/caregiver will verbalize comfort level met Date Initiated: 11/13/2022 Target Resolution Date: 02/06/2023 Goal Status: Active Interventions: Assess comfort goal upon admission Encourage patient to take pain medications as prescribed Treatment Activities: Administer pain control measures as ordered : 11/13/2022 Notes: Venous Leg Ulcer Nursing Diagnoses: Knowledge deficit related to disease process and management Goals: Patient will maintain optimal edema control Date  Initiated: 11/13/2022 Target Resolution Date: 02/06/2023 Goal Status: Active Interventions: Assess peripheral edema status every visit. Compression as ordered Provide education on venous insufficiency Notes: Wound/Skin Impairment Nursing Diagnoses: Knowledge deficit related to ulceration/compromised skin integrity Goals: Patient/caregiver will verbalize understanding of skin care regimen Date Initiated: 11/13/2022 Target Resolution Date: 02/06/2023 Goal Status: Active Interventions: Assess patient/caregiver ability to perform ulcer/skin care regimen upon admission and as needed Assess ulceration(s) every visit Provide education on ulcer and skin care Treatment Activities: Skin care regimen initiated : 11/13/2022 Topical wound management initiated : 11/13/2022 Notes: Electronic Signature(s) Signed: 12/18/2022 4:49:20 PM By: Redmond Pulling RN, BSN Entered By: Redmond Pulling on 12/18/2022 16:04:28 [name] ([id number]) [id number] Page 6 of 10 -------------------------------------------------------------------------------- Pain Assessment Details Patient Name: Date of ServiceCorinna Faulkner, [name] RNA 12/18/2022 3:30 PM Medical Record Number: [id number] Patient Account Number: [id number] Date of Birth/Sex: Treating RN: [birth year] (89 y.o. [name] Primary Care Nihal Doan: Ardean Larsen Other Clinician: Referring Mareta Chesnut: Treating Vinicius Brockman/Extender: [name], [name] in Treatment: 5  Active Problems Location of Pain Severity and Description of Pain Patient Has Paino No Site Locations Pain Management and Medication Current Pain Management: Electronic Signature(s) Signed: 12/18/2022 4:49:20 PM By: Redmond Pulling RN, BSN Entered By: Redmond Pulling on 12/18/2022 15:51:17 -------------------------------------------------------------------------------- Patient/Caregiver Education Details Patient Name: Date of Service: [name], [place]on [place] RNA  6/11/2024andnbsp3:30 PM Medical Record Number: [id number] Patient Account Number: [id number] Date of Birth/Gender: Treating RN: Sep 14, 1923 (89 y.o. [name] Primary Care Physician: Ardean Larsen Other Clinician: Referring Physician: Treating Physician/Extender: [name]: 5 Education Assessment Education Provided To: Patient Education Topics Provided Wound/Skin Impairment: Methods: Explain/Verbal Responses: State content correctly Electronic Signature(s) Signed: 12/18/2022 4:49:20 PM By: Redmond Pulling RN, BSN Entered By: Redmond Pulling on 12/18/2022 16:04:41 Wound Assessment Details -------------------------------------------------------------------------------- [name] ([id number]) [id number] Page 7 of 10 Patient Name: Date of ServiceCorinna Faulkner, [name] RNA 12/18/2022 3:30 PM Medical Record Number: [id number] Patient Account Number: [id number] Date of Birth/Sex: Treating RN: 1923/12/27 (89 y.o. [name] Primary Care Ariea Rochin: Ardean Larsen Other Clinician: Referring Kynedi Profitt: Treating Amirr Achord/Extender: [name], [name] in Treatment: 5 Wound Status Wound Number: 1 Primary Etiology: Venous Leg Ulcer Wound Location: Right, Lateral Ankle Wound Status: Open Wounding Event: Gradually Appeared Comorbid History: Asthma, Hypertension Date Acquired: 06/08/2022 Weeks Of Treatment: 5 Clustered Wound: No Photos Wound Measurements Length: (cm) 0.1 % Reduction in Area: 100% Width: (cm) 0.1 % Reduction in Volume: 100% Depth: (cm) 0.1 Epithelialization: Large (67-100%) Area: (cm) 0.008 Tunneling: No Volume: (cm) 0.001 Undermining: No Wound Description Classification: Full Thickness Without Exposed Support Structures Foul Odor After Cleansing: No Exudate Amount: None Present Slough/Fibrino No Wound Bed Granulation Amount: None Present (0%) Exposed Structure Necrotic  Amount: None Present (0%) Fascia Exposed: No Fat Layer (Subcutaneous Tissue) Exposed: Yes Tendon Exposed: No Muscle Exposed: No Joint Exposed: No Bone Exposed: No Periwound Skin Texture Texture Color No Abnormalities Noted: No No Abnormalities Noted: No Callus: No Atrophie Blanche: No Crepitus: No Cyanosis: No Excoriation: No Ecchymosis: Yes Induration: No Erythema: No Rash: No Hemosiderin Staining: No Scarring: No Mottled: No Pallor: No Moisture Rubor: No No Abnormalities Noted: No Dry / Scaly: Yes Maceration: Yes Treatment Notes Wound #1 (Ankle) Wound Laterality: Right, Lateral Cleanser Soap and Water Discharge Instruction: May shower and wash wound with dial antibacterial soap and water prior to dressing change. Vashe 5.8 (oz) Discharge Instruction: Cleanse the wound with Vashe prior to applying a clean dressing using gauze sponges, not tissue or cotton balls. [name] ([id number]) [id number] Page 8 of 10 Peri-Wound Care Triamcinolone 15 (g) Discharge Instruction: Use triamcinolone 15 (g) as directed Sween Lotion (Moisturizing lotion) Discharge Instruction: Apply moisturizing lotion as directed Topical Gentamicin Discharge Instruction: As directed by physician Mupirocin Ointment Discharge Instruction: Apply Mupirocin (Bactroban) as instructed Primary Dressing Hydrofera Blue Ready Transfer Foam, 8x8 (in/in) Discharge Instruction: Apply to wound bed as instructed Secondary Dressing ABD Pad, 8x10 Discharge Instruction: Apply over primary dressing as directed. Secured With Compression Wrap Kerlix Roll 4.5x3.1 (in/yd) Discharge Instruction: Apply Kerlix and Coban compression as directed. Coban Self-Adherent Wrap 4x5 (in/yd) Discharge Instruction: Apply over Kerlix as directed. Compression Stockings Add-Ons Electronic Signature(s) Signed: 12/18/2022 4:49:20 PM By: Redmond Pulling RN, BSN Entered By: Redmond Pulling on 12/18/2022  16:00:31 -------------------------------------------------------------------------------- Wound Assessment Details Patient Name: Date of Service: [name], [place] RNA 12/18/2022 3:30 PM Medical Record Number: [id number] Patient Account Number: [id number] Date of Birth/Sex: Treating RN: Sep 17, 1923 (89 y.o. [name] Primary Care Aliciana Ricciardi: Ardean Larsen Other Clinician: Referring  Burak Zerbe: Treating Cay Kath/Extender: [name], [name] in Treatment: 5 Wound Status Wound Number: 2 Primary Etiology: Venous Leg Ulcer Wound Location: Right, Medial Ankle Wound Status: Open Wounding Event: Gradually Appeared Comorbid History: Asthma, Hypertension Date Acquired: 06/08/2022 Weeks Of Treatment: 5 Clustered Wound: No Photos Wound Measurements Length: (cm) 1 [name] ([id number]) Width: (cm) 1 Depth: (cm) 0.1 Area: (cm) 0.785 Volume: (cm) 0.079 % Reduction in Area: -523% [id number] Page 9 of 10 % Reduction in Volume: -507.7% Epithelialization: None Tunneling: No Undermining: No Wound Description Classification: Full Thickness Without Exposed Support Structures Exudate Amount: Medium Exudate Type: Serosanguineous Exudate Color: red, brown Foul Odor After Cleansing: No Slough/Fibrino Yes Wound Bed Granulation Amount: None Present (0%) Exposed Structure Necrotic Amount: Large (67-100%) Fascia Exposed: No Necrotic Quality: Adherent Slough Fat Layer (Subcutaneous Tissue) Exposed: Yes Tendon Exposed: No Muscle Exposed: No Joint Exposed: No Bone Exposed: No Periwound Skin Texture Texture Color No Abnormalities Noted: Yes No Abnormalities Noted: Yes Moisture Temperature / Pain No Abnormalities Noted: Yes Temperature: No Abnormality Treatment Notes Wound #2 (Ankle) Wound Laterality: Right, Medial Cleanser Soap and Water Discharge Instruction: May shower and wash wound with dial antibacterial soap and water prior to  dressing change. Vashe 5.8 (oz) Discharge Instruction: Cleanse the wound with Vashe prior to applying a clean dressing using gauze sponges, not tissue or cotton balls. Peri-Wound Care Triamcinolone 15 (g) Discharge Instruction: Use triamcinolone 15 (g) as directed Sween Lotion (Moisturizing lotion) Discharge Instruction: Apply moisturizing lotion as directed Topical Gentamicin Discharge Instruction: As directed by physician Mupirocin Ointment Discharge Instruction: Apply Mupirocin (Bactroban) as instructed Primary Dressing Hydrofera Blue Ready Transfer Foam, 8x8 (in/in) Discharge Instruction: Apply to wound bed as instructed Secondary Dressing ABD Pad, 8x10 Discharge Instruction: Apply over primary dressing as directed. Secured With Compression Wrap Kerlix Roll 4.5x3.1 (in/yd) Discharge Instruction: Apply Kerlix and Coban compression as directed. Coban Self-Adherent Wrap 4x5 (in/yd) Discharge Instruction: Apply over Kerlix as directed. Compression Stockings Add-Ons Electronic Signature(s) Signed: 12/18/2022 4:49:20 PM By: Redmond Pulling RN, BSN Entered By: Redmond Pulling on 12/18/2022 16:01:05 [name] ([id number]) [id number] Page 10 of 10 -------------------------------------------------------------------------------- Vitals Details Patient Name: Date of ServiceCorinna Faulkner, [name] 12/18/2022 3:30 PM Medical Record Number: [id number] Patient Account Number: [id number] Date of Birth/Sex: Treating RN: June 16, 1924 (89 y.o. [name] Primary Care Devere Brem: Ardean Larsen Other Clinician: Referring Vola Beneke: Treating Magaret Justo/Extender: [name], [name] in Treatment: 5 Vital Signs Time Taken: 15:50 Temperature (F): 98.2 Height (in): 62 Pulse (bpm): 79 Weight (lbs): 120 Respiratory Rate (breaths/min): 18 Body Mass Index (BMI): 21.9 Blood Pressure (mmHg): 166/68 Reference Range: 80 - 120 mg / dl Electronic  Signature(s) Signed: 12/18/2022 4:49:20 PM By: Redmond Pulling RN, BSN Entered By: Redmond Pulling on 12/18/2022 15:50:59

## 2022-12-19 NOTE — Progress Notes (Signed)
[name] ([id number]) [id number] Page 1 of 8 Visit Report for 12/18/2022 Chief Complaint Document Details Patient Name: Date of Service: [name], [name] RNA 12/18/2022 3:30 PM Medical Record Number: [id number] Patient Account Number: [id number] Date of Birth/Sex: Treating RN: [birth year] (88 y.o. F) Primary Care Provider: Ardean Larsen Other Clinician: Referring Provider: Treating Provider/Extender: [name], [name] in Treatment: 5 Information Obtained from: Patient Chief Complaint 11/13/2022; patient is here for review of wounds on her right lateral and right medial ankle Electronic Signature(s) Signed: 12/18/2022 4:34:44 PM By: Geralyn Corwin DO Entered By: Geralyn Corwin on 12/18/2022 16:25:21 -------------------------------------------------------------------------------- HPI Details Patient Name: Date of Service: [name], [place] RNA 12/18/2022 3:30 PM Medical Record Number: [id number] Patient Account Number: [id number] Date of Birth/Sex: Treating RN: Dec 04, 1923 (89 y.o. F) Primary Care Provider: Ardean Larsen Other Clinician: Referring Provider: Treating Provider/Extender: [name], [name] in Treatment: 5 History of Present Illness HPI Description: ADMISSION 11/12/2021 This is a 89 year old independent woman who arrives for review of wounds on her lateral and medial ankle. It is a bit difficult to get a precise history here but it is quite clear that these have been present for many months. She states that she normally wore stockings however the wound laterally started to drain too much so she has not been wearing them. She has developed a circular erythematous area around the wounds on the lateral malleolus and her primary care doctor has given her samples of Nuzyra to use for cellulitis. Past medical history is actually very limited to hypertension, hearing loss. She had a rectal resection for cancer in 2008.  In 2023 she had a small bowel obstruction. She also says she has a history of cellulitis of both legs ABIs in our clinic were difficult to obtain at 0.61 bilaterally 5/16; patient presents for follow-up. We have been using Hydrofera Blue under Kerlix/Coban to the right lower extremity. She is scheduled to have her ABIs done next week. She has no issues or complaints. She tolerated the wrap well. 5/28; patient presents for follow-up. We have been using Hydrofera Blue under Kerlix/Coban to the right lower extremity wounds. She took the wraps off to have her ABIs and TBI's completed. She has been without the wrap or dressings for the past 5 days. She has irritation to the right lateral leg. Her ABIs were noted to be 0.96 on the right with monophasic waveforms throughout and a TBI of 0.3. 6/4; right medial malleolus and just behind the right lateral malleolus. We have been using topical antibiotics and Hydrofera Blue under kerlix Coban, can compression. There was a referral I think placed to vein and vascular for follow-up of her arterial studies 6/11; patient presents for follow-up. We have been using antibiotic ointment and Hydrofera Blue under Kerlix/Coban to the right lateral and medial feet wounds. She still has not heard from vein and vascular for follow-up of arterial study results. Electronic Signature(s) Signed: 12/18/2022 4:34:44 PM By: Geralyn Corwin DO Entered By: Geralyn Corwin on 12/18/2022 16:26:01 -------------------------------------------------------------------------------- Physical Exam Details Patient Name: Date of ServiceTorrie Julie RNA 12/18/2022 3:30 PM Medical Record Number: [id number] Patient Account Number: [id number] Date of Birth/Sex: Treating RN: Jun 13, 1924 (89 y.o. [name], [name] ([id number]) [id number] Page 2 of 8 Primary Care Provider: Ardean Larsen Other Clinician: Referring Provider: Treating Provider/Extender: [name], [name] in Treatment: 5 Constitutional respirations regular, non-labored and within target range for patient.. Cardiovascular 2+ dorsalis pedis/posterior tibialis pulses. Psychiatric pleasant and cooperative. Notes Skin breakdown  to the right lateral foot with no signs of infection. Open wound to the right medial malleolus with unhealthy tissue throughout. No signs of active infection here either. Significant stasis dermatitis and varicosities. Electronic Signature(s) Signed: 12/18/2022 4:34:44 PM By: Geralyn Corwin DO Entered By: Geralyn Corwin on 12/18/2022 16:27:21 -------------------------------------------------------------------------------- Physician Orders Details Patient Name: Date of Service: [name], [place] RNA 12/18/2022 3:30 PM Medical Record Number: [id number] Patient Account Number: [id number] Date of Birth/Sex: Treating RN: Nov 17, 1923 (89 y.o. [name] Primary Care Provider: Ardean Larsen Other Clinician: Referring Provider: Treating Provider/Extender: [name], [name] in Treatment: 5 Verbal / Phone Orders: No Diagnosis Coding Follow-up Appointments ppointment in 1 week. - Dr. Mikey Bussing Return A Other: - Call Vein and Vascular [phone] to schedule an appointment for treatment following results of arterial studies Anesthetic (In clinic) Topical Lidocaine 4% applied to wound bed Bathing/ Shower/ Hygiene May shower with protection but do not get wound dressing(s) wet. Protect dressing(s) with water repellant cover (for example, large plastic bag) or a cast cover and may then take shower. Edema Control - Lymphedema / SCD / Other Elevate legs to the level of the heart or above for 30 minutes daily and/or when sitting for 3-4 times a day throughout the day. Avoid standing for long periods of time. Exercise regularly Wound Treatment Wound #1 - Ankle Wound Laterality: Right, Lateral Cleanser: Soap and Water 1 x Per  Week/30 Days Discharge Instructions: May shower and wash wound with dial antibacterial soap and water prior to dressing change. Cleanser: Vashe 5.8 (oz) 1 x Per Week/30 Days Discharge Instructions: Cleanse the wound with Vashe prior to applying a clean dressing using gauze sponges, not tissue or cotton balls. Peri-Wound Care: Triamcinolone 15 (g) 1 x Per Week/30 Days Discharge Instructions: Use triamcinolone 15 (g) as directed Peri-Wound Care: Sween Lotion (Moisturizing lotion) 1 x Per Week/30 Days Discharge Instructions: Apply moisturizing lotion as directed Topical: Gentamicin 1 x Per Week/30 Days Discharge Instructions: As directed by physician Topical: Mupirocin Ointment 1 x Per Week/30 Days Discharge Instructions: Apply Mupirocin (Bactroban) as instructed Prim Dressing: Hydrofera Blue Ready Transfer Foam, 8x8 (in/in) 1 x Per Week/30 Days ary Discharge Instructions: Apply to wound bed as instructed Secondary Dressing: ABD Pad, 8x10 1 x Per Week/30 Days Julie, Faulkner ([id number]) [id number] Page 3 of 8 Discharge Instructions: Apply over primary dressing as directed. Compression Wrap: Kerlix Roll 4.5x3.1 (in/yd) 1 x Per Week/30 Days Discharge Instructions: Apply Kerlix and Coban compression as directed. Compression Wrap: Coban Self-Adherent Wrap 4x5 (in/yd) 1 x Per Week/30 Days Discharge Instructions: Apply over Kerlix as directed. Wound #2 - Ankle Wound Laterality: Right, Medial Cleanser: Soap and Water 1 x Per Week/30 Days Discharge Instructions: May shower and wash wound with dial antibacterial soap and water prior to dressing change. Cleanser: Vashe 5.8 (oz) 1 x Per Week/30 Days Discharge Instructions: Cleanse the wound with Vashe prior to applying a clean dressing using gauze sponges, not tissue or cotton balls. Peri-Wound Care: Triamcinolone 15 (g) 1 x Per Week/30 Days Discharge Instructions: Use triamcinolone 15 (g) as directed Peri-Wound Care: Sween  Lotion (Moisturizing lotion) 1 x Per Week/30 Days Discharge Instructions: Apply moisturizing lotion as directed Topical: Gentamicin 1 x Per Week/30 Days Discharge Instructions: As directed by physician Topical: Mupirocin Ointment 1 x Per Week/30 Days Discharge Instructions: Apply Mupirocin (Bactroban) as instructed Prim Dressing: Hydrofera Blue Ready Transfer Foam, 8x8 (in/in) 1 x Per Week/30 Days ary Discharge Instructions: Apply to wound bed as instructed Secondary Dressing: ABD  Pad, 8x10 1 x Per Week/30 Days Discharge Instructions: Apply over primary dressing as directed. Compression Wrap: Kerlix Roll 4.5x3.1 (in/yd) 1 x Per Week/30 Days Discharge Instructions: Apply Kerlix and Coban compression as directed. Compression Wrap: Coban Self-Adherent Wrap 4x5 (in/yd) 1 x Per Week/30 Days Discharge Instructions: Apply over Kerlix as directed. Patient Medications llergies: Levaquin A Notifications Medication Indication Start End 12/18/2022 lidocaine DOSE topical 4 % cream - cream topical once daily Electronic Signature(s) Signed: 12/18/2022 4:34:44 PM By: Geralyn Corwin DO Entered By: Geralyn Corwin on 12/18/2022 16:27:29 -------------------------------------------------------------------------------- Problem List Details Patient Name: Date of Service: [name], [place] RNA 12/18/2022 3:30 PM Medical Record Number: [id number] Patient Account Number: [id number] Date of Birth/Sex: Treating RN: [birth year] (89 y.o. 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 F) Primary Care Provider: Ardean Larsen Other Clinician: Referring Provider: Treating Provider/Extender: [name], [name] in Treatment: 5 Subjective Chief Complaint Information obtained from Patient 11/13/2022; patient is here for review of wounds on her right lateral and right medial ankle History of Present Illness (HPI) ADMISSION 11/12/2021 This is a 89 year old independent woman who arrives for review of wounds on her lateral and medial ankle. It is a bit difficult to get a precise history here but it is quite clear that these have been present for many months. She states that she normally wore stockings however the wound laterally started to drain too much so she has not been wearing them. She has developed a circular erythematous area around the wounds on the lateral malleolus and her primary care doctor has given her samples of Nuzyra to use for cellulitis. Past medical history is actually very limited to hypertension, hearing loss. She had a rectal resection for cancer in 2008. In 2023 she had a small bowel obstruction. She also says she has a history of cellulitis of both legs ABIs in our clinic were difficult to obtain at 0.61 bilaterally 5/16; patient presents for follow-up. We have been using Hydrofera Blue  under Kerlix/Coban to the right lower extremity. She is scheduled to have her ABIs done next week. She has no issues or complaints. She tolerated the wrap well. 5/28; patient presents for follow-up. We have been using Hydrofera Blue under Kerlix/Coban to the right lower extremity wounds. She took the wraps off to have her ABIs and TBI's completed. She has been without the wrap or dressings for the past 5 days. She has irritation to the right lateral leg. Her ABIs were noted to be 0.96 on the right with monophasic waveforms throughout and a TBI of 0.3. 6/4; right medial malleolus and just behind the right lateral malleolus. We have been using topical antibiotics and Hydrofera Blue under kerlix Coban, can  compression. There was a referral I think placed to vein and vascular for follow-up of her arterial studies 6/11; patient presents for follow-up. We have been using antibiotic ointment and Hydrofera Blue under Kerlix/Coban to the right lateral and medial feet wounds. She still has not heard from vein and vascular for follow-up of arterial study results. Patient History Social History Never smoker, Alcohol Use - Never, Drug Use - No History, Caffeine Use - Never. Medical History Respiratory Patient has history of Asthma Cardiovascular Patient has history of Hypertension Oncologic Denies history of Received Chemotherapy, Received Radiation Hospitalization/Surgery History - 2023 SBO, inguinal hernia, gangrene, FTT- bowel resection. - tonsillectomy and adenoidectomy. [name] ([id number]) [id number] Page 5 of 8 Medical A Surgical History Notes nd Cardiovascular aortic stenosis Gastrointestinal rectal Ca Integumentary (Skin) left lower leg cellulitis Oncologic 2008 rectal Ca Objective Constitutional respirations regular, non-labored and within target range for patient.. Vitals Time Taken: 3:50 PM, Height: 62 in, Weight: 120 lbs, BMI: 21.9, Temperature:  98.2 F, Pulse: 79 bpm, Respiratory Rate: 18 breaths/min, Blood Pressure: 166/68 mmHg. Cardiovascular 2+ dorsalis pedis/posterior tibialis pulses. Psychiatric pleasant and cooperative. General Notes: Skin breakdown to the right lateral foot with no signs of infection. Open wound to the right medial malleolus with unhealthy tissue throughout. No signs of active infection here either. Significant stasis dermatitis and varicosities. Integumentary (Hair, Skin) Wound #1 status is Open. Original cause of wound was Gradually Appeared. The date acquired was: 06/08/2022. The wound has been in treatment 5 weeks. The wound is located on the Right,Lateral Ankle. The wound measures 0.1cm length x 0.1cm width x 0.1cm depth; 0.008cm^2 area and 0.001cm^3 volume. There is Fat Layer (Subcutaneous Tissue) exposed. There is no tunneling or undermining noted. There is a none present amount of drainage noted. There is no granulation within the wound bed. There is no necrotic tissue within the wound bed. The periwound skin appearance exhibited: Dry/Scaly, Maceration, Ecchymosis. The periwound skin appearance did not exhibit: Callus, Crepitus, Excoriation, Induration, Rash, Scarring, Atrophie Blanche, Cyanosis, Hemosiderin Staining, Mottled, Pallor, Rubor, Erythema. Wound #2 status is Open. Original cause of wound was Gradually Appeared. The date acquired was: 06/08/2022. The wound has been in treatment 5 weeks. The wound is located on the Right,Medial Ankle. The wound measures 1cm length x 1cm width x 0.1cm depth; 0.785cm^2 area and 0.079cm^3 volume. There is Fat Layer (Subcutaneous Tissue) exposed. There is no tunneling or undermining noted. There is a medium amount of serosanguineous drainage noted. There is no granulation within the wound bed. There is a large (67-100%) amount of necrotic tissue within the wound bed including Adherent Slough. The periwound skin appearance had no abnormalities noted for texture. The  periwound skin appearance had no abnormalities noted for moisture. The periwound skin appearance had no abnormalities noted for color. Periwound temperature was noted as No Abnormality. 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 The right medial ankle wound is stable. I recommended continuing the course with antibiotic ointment and Hydrofera Blue to the wound beds under Kerlix/Coban. We gave her the number to call vein and vascular to follow-up the referral made. Plan Follow-up Appointments: Return Appointment in 1 week. - Dr. Mikey Bussing Other: - Call Vein and Vascular [phone] to schedule an appointment for treatment following results of  arterial studies Anesthetic: (In clinic) Topical Lidocaine 4% applied to wound bed Bathing/ Shower/ Hygiene: May shower with protection but do not get wound dressing(s) wet. Protect dressing(s) with water repellant cover (for example, large plastic bag) or a cast cover and may then take shower. Edema Control - Lymphedema / SCD / OtherBRIDIE, Faulkner ([id number]) [id number] Page 6 of 8 Elevate legs to the level of the heart or above for 30 minutes daily and/or when sitting for 3-4 times a day throughout the day. Avoid standing for long periods of time. Exercise regularly The following medication(s) was prescribed: lidocaine topical 4 % cream cream topical once daily was prescribed at facility WOUND #1: - Ankle Wound Laterality: Right, Lateral Cleanser: Soap and Water 1 x Per Week/30 Days Discharge Instructions: May shower and wash wound with dial antibacterial soap and water prior to dressing change. Cleanser: Vashe  5.8 (oz) 1 x Per Week/30 Days Discharge Instructions: Cleanse the wound with Vashe prior to applying a clean dressing using gauze sponges, not tissue or cotton balls. Peri-Wound Care: Triamcinolone 15 (g) 1 x Per Week/30 Days Discharge Instructions: Use triamcinolone 15 (g) as directed Peri-Wound Care: Sween Lotion (Moisturizing lotion) 1 x Per Week/30 Days Discharge Instructions: Apply moisturizing lotion as directed Topical: Gentamicin 1 x Per Week/30 Days Discharge Instructions: As directed by physician Topical: Mupirocin Ointment 1 x Per Week/30 Days Discharge Instructions: Apply Mupirocin (Bactroban) as instructed Prim Dressing: Hydrofera Blue Ready Transfer Foam, 8x8 (in/in) 1 x Per Week/30 Days ary Discharge Instructions: Apply to wound bed as instructed Secondary Dressing: ABD Pad, 8x10 1 x Per Week/30 Days Discharge Instructions: Apply over primary dressing as directed. Com pression Wrap: Kerlix Roll 4.5x3.1 (in/yd) 1 x Per Week/30 Days Discharge Instructions: Apply Kerlix and Coban compression as directed. Com pression Wrap: Coban Self-Adherent Wrap 4x5 (in/yd) 1 x Per Week/30 Days Discharge Instructions: Apply over Kerlix as directed. WOUND #2: - Ankle Wound Laterality: Right, Medial Cleanser: Soap and Water 1 x Per Week/30 Days Discharge Instructions: May shower and wash wound with dial antibacterial soap and water prior to dressing change. Cleanser: Vashe 5.8 (oz) 1 x Per Week/30 Days Discharge Instructions: Cleanse the wound with Vashe prior to applying a clean dressing using gauze sponges, not tissue or cotton balls. Peri-Wound Care: Triamcinolone 15 (g) 1 x Per Week/30 Days Discharge Instructions: Use triamcinolone 15 (g) as directed Peri-Wound Care: Sween Lotion (Moisturizing lotion) 1 x Per Week/30 Days Discharge Instructions: Apply moisturizing lotion as directed Topical: Gentamicin 1 x Per Week/30 Days Discharge Instructions: As directed by physician Topical:  Mupirocin Ointment 1 x Per Week/30 Days Discharge Instructions: Apply Mupirocin (Bactroban) as instructed Prim Dressing: Hydrofera Blue Ready Transfer Foam, 8x8 (in/in) 1 x Per Week/30 Days ary Discharge Instructions: Apply to wound bed as instructed Secondary Dressing: ABD Pad, 8x10 1 x Per Week/30 Days Discharge Instructions: Apply over primary dressing as directed. Com pression Wrap: Kerlix Roll 4.5x3.1 (in/yd) 1 x Per Week/30 Days Discharge Instructions: Apply Kerlix and Coban compression as directed. Com pression Wrap: Coban Self-Adherent Wrap 4x5 (in/yd) 1 x Per Week/30 Days Discharge Instructions: Apply over Kerlix as directed. 1. Hydrofera Blue with antibiotic ointment under Kerlix/Coban 2. Follow-up in 1 week Electronic Signature(s) Signed: 12/18/2022 4:34:44 PM By: Geralyn Corwin DO Entered By: Geralyn Corwin on 12/18/2022 16:29:12 -------------------------------------------------------------------------------- HxROS Details Patient Name: Date of Service: [name], [place] RNA 12/18/2022 3:30 PM Medical Record Number: [id number] Patient Account Number: [id number] Date of Birth/Sex: Treating RN: 19-May-1924 (89 y.o. F) Primary Care  Provider: Ardean Larsen Other Clinician: Referring Provider: Treating Provider/Extender: [name], [name] in Treatment: 5 Respiratory Medical History: Positive for: Asthma Cardiovascular Medical History: Positive for: Hypertension Past Medical History Notes: aortic stenosis Gastrointestinal Julie, Faulkner ([id number]) [id number] Page 7 of 8 Medical History: Past Medical History Notes: rectal Ca Integumentary (Skin) Medical History: Past Medical History Notes: left lower leg cellulitis Oncologic Medical History: Negative for: Received Chemotherapy; Received Radiation Past Medical History Notes: 2008 rectal Ca Immunizations Pneumococcal Vaccine: Received Pneumococcal Vaccination:  No Implantable Devices No devices added Hospitalization / Surgery History Type of Hospitalization/Surgery 2023 SBO, inguinal hernia, gangrene, FTT- bowel resection tonsillectomy and adenoidectomy Family and Social History Never smoker; Alcohol Use: Never; Drug Use: No History; Caffeine Use: Never; Financial Concerns: No; Food, Clothing or Shelter Needs: No; Support System Lacking: No; Transportation Concerns: No Electronic Signature(s) Signed: 12/18/2022 4:34:44 PM By: Geralyn Corwin DO Entered By: Geralyn Corwin on 12/18/2022 16:26:06 -------------------------------------------------------------------------------- SuperBill Details Patient Name: Date of Service: [name], [place] RNA 12/18/2022 Medical Record Number: [id number] Patient Account Number: [id number] Date of Birth/Sex: Treating RN: [birth year] (89 y.o. 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 I10 Essential (primary) hypertension Facility Procedures : CPT4 Code: [id number] Description: 99213 - WOUND CARE VISIT-LEV 3 EST PT Modifier: Quantity: 1 Physician Procedures : CPT4 Code Description Modifier [id number] 99213 - WC PHYS LEVEL 3 - EST PT ICD-10 Diagnosis Description L97.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

## 2022-12-31 ENCOUNTER — Encounter (HOSPITAL_BASED_OUTPATIENT_CLINIC_OR_DEPARTMENT_OTHER): Payer: 59 | Admitting: Internal Medicine

## 2022-12-31 DIAGNOSIS — I70233 Atherosclerosis of native arteries of right leg with ulceration of ankle: Secondary | ICD-10-CM | POA: Diagnosis not present

## 2022-12-31 DIAGNOSIS — L97311 Non-pressure chronic ulcer of right ankle limited to breakdown of skin: Secondary | ICD-10-CM

## 2022-12-31 DIAGNOSIS — I10 Essential (primary) hypertension: Secondary | ICD-10-CM

## 2022-12-31 DIAGNOSIS — I87331 Chronic venous hypertension (idiopathic) with ulcer and inflammation of right lower extremity: Secondary | ICD-10-CM | POA: Diagnosis not present

## 2023-01-01 NOTE — Progress Notes (Signed)
[name] ([id number]) [id number] Page 1 of 8 Visit Report for 12/31/2022 Chief Complaint Document Details Patient Name: Date of Service: [name], [name] RNA 12/31/2022 3:30 PM Medical Record Number: [id number] Patient Account Number: [id number] Date of Birth/Sex: Treating RN: Dec 01, 1923 (89 y.o. F) Primary Care Provider: Ardean Faulkner Other Clinician: Referring Provider: Treating Provider/Extender: [name], [name] in Treatment: 6 Information Obtained from: Patient Chief Complaint 11/13/2022; patient is here for review of wounds on her right lateral and right medial ankle Electronic Signature(s) Signed: 12/31/2022 4:29:34 PM By: Julie Corwin DO Entered By: [name] on 12/31/2022 16:25:56 -------------------------------------------------------------------------------- HPI Details Patient Name: Date of Service: [name], LO RNA 12/31/2022 3:30 PM Medical Record Number: [id number] Patient Account Number: [id number] Date of Birth/Sex: Treating RN: [birth year] (89 y.o. F) Primary Care Provider: Ardean Faulkner Other Clinician: Referring Provider: Treating Provider/Extender: [name], [name] in Treatment: 6 History of Present Illness HPI Description: ADMISSION 11/12/2021 This is a 89 year old independent woman who arrives for review of wounds on her lateral and medial ankle. It is a bit difficult to get a precise history here but it is quite clear that these have been present for many months. She states that she normally wore stockings however the wound laterally started to drain too much so she has not been wearing them. She has developed a circular erythematous area around the wounds on the lateral malleolus and her primary care doctor has given her samples of Nuzyra to use for cellulitis. Past medical history is actually very limited to hypertension, hearing loss. She had a rectal resection for cancer in 2008.  In 2023 she had a small bowel obstruction. She also says she has a history of cellulitis of both legs ABIs in our clinic were difficult to obtain at 0.61 bilaterally 5/16; patient presents for follow-up. We have been using Hydrofera Blue under Kerlix/Coban to the right lower extremity. She is scheduled to have her ABIs done next week. She has no issues or complaints. She tolerated the wrap well. 5/28; patient presents for follow-up. We have been using Hydrofera Blue under Kerlix/Coban to the right lower extremity wounds. She took the wraps off to have her ABIs and TBI's completed. She has been without the wrap or dressings for the past 5 days. She has irritation to the right lateral leg. Her ABIs were noted to be 0.96 on the right with monophasic waveforms throughout and a TBI of 0.3. 6/4; right medial malleolus and just behind the right lateral malleolus. We have been using topical antibiotics and Hydrofera Blue under kerlix Coban, can compression. There was a referral I think placed to vein and vascular for follow-up of her arterial studies 6/11; patient presents for follow-up. We have been using antibiotic ointment and Hydrofera Blue under Kerlix/Coban to the right lateral and medial feet wounds. She still has not heard from vein and vascular for follow-up of arterial study results. 6/24; patient presents for follow-up. We have been using antibiotic ointment and Hydrofera Blue under Kerlix/Coban to the feet wounds. The right lateral foot wound has healed. She has been contacted by vein and vascular however patient does not want to follow-up. [name] ([id number]) [id number] Page 2 of 8 Electronic Signature(s) Signed: 12/31/2022 4:29:34 PM By: Julie Corwin DO Entered By: [name] on 12/31/2022 16:26:26 -------------------------------------------------------------------------------- Physical Exam Details Patient Name: Date of ServiceTorrie Faulkner  RNA 12/31/2022 3:30 PM Medical Record Number: [id number] Patient Account Number: [id number] Date of Birth/Sex: Treating RN: 12/30/1923 (89 y.o. F)  Primary Care Provider: Ardean Faulkner Other Clinician: Referring Provider: Treating Provider/Extender: [name], [name] in Treatment: 6 Constitutional respirations regular, non-labored and within target range for patient.. Cardiovascular 2+ dorsalis pedis/posterior tibialis pulses. Psychiatric pleasant and cooperative. Notes Epithelization to the previous wound site on the right lateral foot. Open wound to the right medial malleolus less with granulation tissue although not healthy in appearance. No signs of infection. Significant stasis dermatitis and varicosities. Electronic Signature(s) Signed: 12/31/2022 4:29:34 PM By: Julie Corwin DO Entered By: [name] on 12/31/2022 16:27:09 -------------------------------------------------------------------------------- Physician Orders Details Patient Name: Date of Service: [name], [place] RNA 12/31/2022 3:30 PM Medical Record Number: [id number] Patient Account Number: [id number] Date of Birth/Sex: Treating RN: 1924-01-21 (89 y.o. [name] Primary Care Provider: Ardean Faulkner Other Clinician: Referring Provider: Treating Provider/Extender: [name], [name] in Treatment: 6 Verbal / Phone Orders: No Diagnosis Coding Follow-up Appointments ppointment in 1 week. - Nurse Visit on 01/09/23 at 3:45pm Return A Other: - Call Vein and Vascular [phone] to schedule an appointment for treatment following results of arterial studies Anesthetic (In clinic) Topical Lidocaine 4% applied to wound bed Bathing/ Shower/ Hygiene May shower with protection but do not get wound dressing(s) wet. Protect dressing(s) with water repellant cover (for example, large plastic bag) or a cast cover and may then take shower. Edema Control - Lymphedema / SCD /  Other Elevate legs to the level of the heart or above for 30 minutes daily and/or when sitting for 3-4 times a day throughout the day. Avoid standing for long periods of time. Exercise regularly Wound Treatment [name], [name] ([id number]) [id number] Page 3 of 8 Wound #2 - Ankle Wound Laterality: Right, Medial Cleanser: Soap and Water 1 x Per Week/30 Days Discharge Instructions: May shower and wash wound with dial antibacterial soap and water prior to dressing change. Cleanser: Vashe 5.8 (oz) 1 x Per Week/30 Days Discharge Instructions: Cleanse the wound with Vashe prior to applying a clean dressing using gauze sponges, not tissue or cotton balls. Peri-Wound Care: Triamcinolone 15 (g) 1 x Per Week/30 Days Discharge Instructions: Use triamcinolone 15 (g) as directed Peri-Wound Care: Sween Lotion (Moisturizing lotion) 1 x Per Week/30 Days Discharge Instructions: Apply moisturizing lotion as directed Topical: Gentamicin 1 x Per Week/30 Days Discharge Instructions: As directed by physician Topical: Mupirocin Ointment 1 x Per Week/30 Days Discharge Instructions: Apply Mupirocin (Bactroban) as instructed Prim Dressing: Hydrofera Blue Ready Transfer Foam, 8x8 (in/in) 1 x Per Week/30 Days ary Discharge Instructions: Apply to wound bed as instructed Secondary Dressing: ABD Pad, 8x10 1 x Per Week/30 Days Discharge Instructions: Apply over primary dressing as directed. Compression Wrap: Kerlix Roll 4.5x3.1 (in/yd) 1 x Per Week/30 Days Discharge Instructions: Apply Kerlix and Coban compression as directed. Compression Wrap: Coban Self-Adherent Wrap 4x5 (in/yd) 1 x Per Week/30 Days Discharge Instructions: Apply over Kerlix as directed. Electronic Signature(s) Signed: 12/31/2022 4:29:34 PM By: Julie Corwin DO Signed: 01/01/2023 4:52:02 PM By: Brenton Grills Entered By: Brenton Grills on 12/31/2022  16:27:56 -------------------------------------------------------------------------------- Problem List Details Patient Name: Date of Service: [name], [place] RNA 12/31/2022 3:30 PM Medical Record Number: [id number] Patient Account Number: [id number] Date of Birth/Sex: Treating RN: [birth year] (89 y.o. 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 11/13/2022 No Yes I10 Essential (primary) hypertension 11/13/2022 No Yes [name], [name] ([id number]) [id number] Page 4 of 8 Inactive Problems Resolved Problems Electronic Signature(s) Signed: 12/31/2022 4:29:34 PM By: Julie Corwin DO Entered By: [name] on 12/31/2022 16:25:42 -------------------------------------------------------------------------------- Progress Note Details Patient Name: Date of Service: [name], [place] RNA 12/31/2022 3:30 PM Medical Record Number: [id number] Patient Account Number: [id number] Date of Birth/Sex: Treating RN: 01/18/1924 (89 y.o. F) Primary Care Provider: Ardean Faulkner Other Clinician: Referring Provider: Treating Provider/Extender: [name], [name] in Treatment: 6 Subjective Chief Complaint Information obtained from Patient 11/13/2022; patient is here for review of wounds on her right lateral and right medial ankle History of Present Illness (HPI) ADMISSION 11/12/2021 This is a 89 year old independent woman who arrives for review of wounds on her lateral and  medial ankle. It is a bit difficult to get a precise history here but it is quite clear that these have been present for many months. She states that she normally wore stockings however the wound laterally started to drain too much so she has not been wearing them. She has developed a circular erythematous area around the wounds on the lateral malleolus and her primary care doctor has given her samples of Nuzyra to use for cellulitis. Past medical history is actually very limited to hypertension, hearing loss. She had a rectal resection for cancer in 2008. In 2023 she had a small bowel obstruction. She also says she has a history of cellulitis of both legs ABIs in our clinic were difficult to obtain at 0.61 bilaterally 5/16; patient presents for follow-up. We have been using Hydrofera Blue under Kerlix/Coban to the right lower extremity. She is scheduled to have her ABIs done next week. She has no issues or complaints. She tolerated the wrap well. 5/28; patient presents for follow-up. We have been using Hydrofera Blue under Kerlix/Coban to the right lower extremity wounds. She took the wraps off to have her ABIs and TBI's completed. She has been without the wrap or dressings for the past 5 days. She has irritation to the right lateral leg. Her ABIs were noted to be 0.96 on the right with monophasic waveforms throughout and a TBI of 0.3. 6/4; right medial malleolus and just behind the right lateral malleolus. We have been using topical antibiotics and Hydrofera Blue under kerlix Coban, can compression. There was a referral I think placed to vein and vascular for follow-up of her arterial studies 6/11; patient presents for follow-up. We have been using antibiotic ointment and Hydrofera Blue under Kerlix/Coban to the right lateral and medial feet wounds. She still has not heard from vein and vascular for follow-up of arterial study results. 6/24; patient presents for follow-up. We have been using  antibiotic ointment and Hydrofera Blue under Kerlix/Coban to the feet wounds. The right lateral foot wound has healed. She has been contacted by vein and vascular however patient does not want to follow-up. Patient History Social History Never smoker, Alcohol Use - Never, Drug Use - No History, Caffeine Use - Never. Medical History Respiratory Patient has history of Asthma Cardiovascular Patient has history of Hypertension Oncologic Denies history of Received Chemotherapy, Received Radiation Hospitalization/Surgery History - 2023 SBO, inguinal hernia, gangrene, FTT- bowel resection. - tonsillectomy and adenoidectomy. Medical A Surgical History Notes nd Cardiovascular aortic stenosis Gastrointestinal rectal Ca [name], [name] ([id number]) [id number] Page 5 of  8 Integumentary (Skin) left lower leg cellulitis Oncologic 2008 rectal Ca Objective Constitutional respirations regular, non-labored and within target range for patient.. Vitals Time Taken: 3:49 PM, Height: 62 in, Weight: 120 lbs, BMI: 21.9, Temperature: 98.3 F, Pulse: 80 bpm, Respiratory Rate: 18 breaths/min, Blood Pressure: 164/80 mmHg. Cardiovascular 2+ dorsalis pedis/posterior tibialis pulses. Psychiatric pleasant and cooperative. General Notes: Epithelization to the previous wound site on the right lateral foot. Open wound to the right medial malleolus less with granulation tissue although not healthy in appearance. No signs of infection. Significant stasis dermatitis and varicosities. Integumentary (Hair, Skin) Wound #1 status is Healed - Epithelialized. Original cause of wound was Gradually Appeared. The date acquired was: 06/08/2022. The wound has been in treatment 6 Faulkner. The wound is located on the Right,Lateral Ankle. The wound measures 0cm length x 0cm width x 0cm depth; 0cm^2 area and 0cm^3 volume. There is Fat Layer (Subcutaneous Tissue) exposed. There is no tunneling or undermining  noted. There is a none present amount of drainage noted. There is no granulation within the wound bed. There is no necrotic tissue within the wound bed. The periwound skin appearance exhibited: Dry/Scaly, Ecchymosis. The periwound skin appearance did not exhibit: Callus, Crepitus, Excoriation, Induration, Rash, Scarring, Maceration, Atrophie Blanche, Cyanosis, Hemosiderin Staining, Mottled, Pallor, Rubor, Erythema. Wound #2 status is Open. Original cause of wound was Gradually Appeared. The date acquired was: 06/08/2022. The wound has been in treatment 6 Faulkner. The wound is located on the Right,Medial Ankle. The wound measures 1cm length x 1cm width x 0.1cm depth; 0.785cm^2 area and 0.079cm^3 volume. There is Fat Layer (Subcutaneous Tissue) exposed. There is no tunneling or undermining noted. There is a medium amount of serosanguineous drainage noted. There is no granulation within the wound bed. There is a large (67-100%) amount of necrotic tissue within the wound bed including Adherent Slough. The periwound skin appearance had no abnormalities noted for texture. The periwound skin appearance had no abnormalities noted for moisture. The periwound skin appearance had no abnormalities noted for color. Periwound temperature was noted as No Abnormality. 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 The right medial foot wound is stable. No signs of infection. I recommended continuing the course with antibiotic ointment and Hydrofera Blue under Kerlix/Coban. I also recommended she follow-up with vein and vascular to assure that she has adequate blood flow for poor wound healing. I let her know  that this is delaying my ability to further help her heal without this information. She expressed understanding. She states she will try and coordinate an appointment. Procedures Wound #2 Pre-procedure diagnosis of Wound #2 is a Venous Leg Ulcer located on the Right,Medial Ankle . There was a Double Layer Compression Therapy Procedure by Brenton Grills, RN. Post procedure Diagnosis Wound #2: Same as Pre-Procedure Plan [name], [name] ([id number]) [id number] Page 6 of 8 Follow-up Appointments: Return Appointment in 1 week. - Dr. Mikey Bussing Other: - Call Vein and Vascular [phone] to schedule an appointment for treatment following results of arterial studies Anesthetic: (In clinic) Topical Lidocaine 4% applied to wound bed Bathing/ Shower/ Hygiene: May shower with protection but do not get wound dressing(s) wet. Protect dressing(s) with water repellant cover (for example, large plastic bag) or a cast cover and may then take  shower. Edema Control - Lymphedema / SCD / Other: Elevate legs to the level of the heart or above for 30 minutes daily and/or when sitting for 3-4 times a day throughout the day. Avoid standing for long periods of time. Exercise regularly WOUND #2: - Ankle Wound Laterality: Right, Medial Cleanser: Soap and Water 1 x Per Week/30 Days Discharge Instructions: May shower and wash wound with dial antibacterial soap and water prior to dressing change. Cleanser: Vashe 5.8 (oz) 1 x Per Week/30 Days Discharge Instructions: Cleanse the wound with Vashe prior to applying a clean dressing using gauze sponges, not tissue or cotton balls. Peri-Wound Care: Triamcinolone 15 (g) 1 x Per Week/30 Days Discharge Instructions: Use triamcinolone 15 (g) as directed Peri-Wound Care: Sween Lotion (Moisturizing lotion) 1 x Per Week/30 Days Discharge Instructions: Apply moisturizing lotion as directed Topical: Gentamicin 1 x Per Week/30 Days Discharge Instructions:  As directed by physician Topical: Mupirocin Ointment 1 x Per Week/30 Days Discharge Instructions: Apply Mupirocin (Bactroban) as instructed Prim Dressing: Hydrofera Blue Ready Transfer Foam, 8x8 (in/in) 1 x Per Week/30 Days ary Discharge Instructions: Apply to wound bed as instructed Secondary Dressing: ABD Pad, 8x10 1 x Per Week/30 Days Discharge Instructions: Apply over primary dressing as directed. Com pression Wrap: Kerlix Roll 4.5x3.1 (in/yd) 1 x Per Week/30 Days Discharge Instructions: Apply Kerlix and Coban compression as directed. Com pression Wrap: Coban Self-Adherent Wrap 4x5 (in/yd) 1 x Per Week/30 Days Discharge Instructions: Apply over Kerlix as directed. 1. Hydrofera Blue with antibiotic ointment under Kerlix/Cobanright lower extremity 2. Follow-up next week 3. Follow-up with vein and vascular Electronic Signature(s) Signed: 12/31/2022 4:29:34 PM By: Julie Corwin DO Entered By: [name] on 12/31/2022 16:28:50 -------------------------------------------------------------------------------- HxROS Details Patient Name: Date of Service: [name], [place] RNA 12/31/2022 3:30 PM Medical Record Number: [id number] Patient Account Number: [id number] Date of Birth/Sex: Treating RN: September 16, 1923 (89 y.o. F) Primary Care Provider: Ardean Faulkner Other Clinician: Referring Provider: Treating Provider/Extender: [name], [name] in Treatment: 6 Respiratory Medical History: Positive for: Asthma Cardiovascular Medical History: Positive for: Hypertension Past Medical History Notes: aortic stenosis Gastrointestinal Medical History: Past Medical History NotesYOLINDA, [name] ([id number]) [id number] Page 7 of 8 Integumentary (Skin) Medical History: Past Medical History Notes: left lower leg cellulitis Oncologic Medical History: Negative for: Received Chemotherapy; Received Radiation Past Medical History  Notes: 2008 rectal Ca Immunizations Pneumococcal Vaccine: Received Pneumococcal Vaccination: No Implantable Devices No devices added Hospitalization / Surgery History Type of Hospitalization/Surgery 2023 SBO, inguinal hernia, gangrene, FTT- bowel resection tonsillectomy and adenoidectomy Family and Social History Never smoker; Alcohol Use: Never; Drug Use: No History; Caffeine Use: Never; Financial Concerns: No; Food, Clothing or Shelter Needs: No; Support System Lacking: No; Transportation Concerns: No Electronic Signature(s) Signed: 12/31/2022 4:29:34 PM By: Julie Corwin DO Entered By: [name] on 12/31/2022 16:26:32 -------------------------------------------------------------------------------- SuperBill Details Patient Name: Date of Service: [name], [place] RNA 12/31/2022 Medical Record Number: [id number] Patient Account Number: [id number] Date of Birth/Sex: Treating RN: [birth year] (89 y.o. 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 I10 Essential (primary) hypertension Facility Procedures : CPT4 Code: [id number] ( Description: Facility Use Only) [id number] - APPLY MULTLAY COMPRS LWR  RT LEG Modifier: Quantity: 1 Physician Procedures : CPT4 Code Description Modifier [id number] - WC PHYS LEVEL 3 - EST PT ICD-10 Diagnosis Description L97.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

## 2023-01-04 NOTE — Progress Notes (Signed)
[name] ([id number]) [id number] Page 1 of 9 Visit Report for 12/31/2022 Arrival Information Details Patient Name: Date of ServiceCorinna Faulkner, [name] RNA 12/31/2022 3:30 PM Medical Record Number: [id number] Patient Account Number: [id number] Date of Birth/Sex: Treating RN: [birth year] (89 y.o. F) Primary Care Elior Robinette: Ardean Larsen Other Clinician: Referring Keelee Yankey: Treating Coren Sagan/Extender: [name], [name] in Treatment: 6 Visit Information History Since Last Visit Added or deleted any medications: No Patient Arrived: Ambulatory Any new allergies or adverse reactions: No Arrival Time: 15:44 Had a fall or experienced change in No Accompanied By: daughter activities of daily living that may affect Transfer Assistance: None risk of falls: Patient Identification Verified: Yes Signs or symptoms of abuse/neglect since last visito No Secondary Verification Process Completed: Yes Hospitalized since last visit: No Patient Requires Transmission-Based Precautions: No Has Dressing in Place as Prescribed: Yes Patient Has Alerts: Yes Has Compression in Place as Prescribed: Yes Patient Alerts: ABI R 0.96 (11/29/22) Pain Present Now: No ABI L 1.25 (11/29/22) Electronic Signature(s) Signed: 01/04/2023 11:18:15 AM By: Thayer Dallas Entered By: Thayer Dallas on 12/31/2022 15:47:05 -------------------------------------------------------------------------------- Compression Therapy Details Patient Name: Date of Service: [name], [place] RNA 12/31/2022 3:30 PM Medical Record Number: [id number] Patient Account Number: [id number] Date of Birth/Sex: Treating RN: [birth year] (89 y.o. Gevena Faulkner Primary Care Maxwell Martorano: Ardean Larsen Other Clinician: Referring Bryn Saline: Treating Masaru Chamberlin/Extender: [name], [name] in Treatment: 6 Compression Therapy Performed for Wound Assessment: Wound #2 Right,Medial Ankle Performed By:  Leighton Parody, RN Compression Type: Double Layer Post Procedure Diagnosis Same as Pre-procedure Electronic Signature(s) Signed: 01/01/2023 4:52:02 PM By: Brenton Grills Entered By: Brenton Grills on 12/31/2022 16:23:09 -------------------------------------------------------------------------------- Encounter Discharge Information Details Patient Name: Date of Service: [name], [place] RNA 12/31/2022 3:30 PM [name] ([id number]) [id number] Page 2 of 9 Medical Record Number: [id number] Patient Account Number: [id number] Date of Birth/Sex: Treating RN: 14-Jan-1924 (89 y.o. Gevena Faulkner Primary Care Amr Sturtevant: Ardean Larsen Other Clinician: Referring Jayr Lupercio: Treating Orlean Holtrop/Extender: [name]: 6 Encounter Discharge Information Items Discharge Condition: Stable Ambulatory Status: Ambulatory Discharge Destination: Home Transportation: Private Auto Accompanied By: self Schedule Follow-up Appointment: Yes Clinical Summary of Care: Patient Declined Electronic Signature(s) Signed: 01/01/2023 4:52:02 PM By: Brenton Grills Entered By: Brenton Grills on 12/31/2022 16:40:28 -------------------------------------------------------------------------------- Lower Extremity Assessment Details Patient Name: Date of Service: [name] RNA 12/31/2022 3:30 PM Medical Record Number: [id number] Patient Account Number: [id number] Date of Birth/Sex: Treating RN: Mar 28, 1924 (89 y.o. F) Primary Care Atlee Villers: Ardean Larsen Other Clinician: Referring Annslee Tercero: Treating Cyan Moultrie/Extender: [name], [name] in Treatment: 6 Edema Assessment Assessed: [Left: No] [Right: No] Edema: [Left: Ye] [Right: s] Calf Left: Right: Point of Measurement: 31 cm From Medial Instep 31 cm Ankle Left: Right: Point of Measurement: 10 cm From Medial Instep 18.5 cm Electronic Signature(s) Signed: 01/04/2023  11:18:15 AM By: Thayer Dallas Entered By: Thayer Dallas on 12/31/2022 15:57:19 -------------------------------------------------------------------------------- Multi Wound Chart Details Patient Name: Date of Service: [name], [place] RNA 12/31/2022 3:30 PM Medical Record Number: [id number] Patient Account Number: [id number] Date of Birth/Sex: Treating RN: 04-23-1924 (89 y.o. F) Primary Care Latoy Labriola: Ardean Larsen Other Clinician: Referring Keanon Bevins: Treating Rosangelica Pevehouse/Extender: [name], [name] in Treatment: 6 Vital Signs Height(in): 62 Pulse(bpm): 80 [place], [name] ([id number]) [id number].pdf Page 3 of 9 Weight(lbs): 120 Blood Pressure(mmHg): 164/80 Body Mass Index(BMI): 21.9 Temperature(F): 98.3 Respiratory Rate(breaths/min): 18 [1:Photos:] [N/A:N/A] Right, Lateral Ankle Right, Medial Ankle N/A Wound Location: Gradually Appeared Gradually Appeared N/A Wounding Event:  Venous Leg Ulcer Venous Leg Ulcer N/A Primary Etiology: Asthma, Hypertension Asthma, Hypertension N/A Comorbid History: 06/08/2022 06/08/2022 N/A Date Acquired: 6 6 N/A Weeks of Treatment: Healed - Epithelialized Open N/A Wound Status: No No N/A Wound Recurrence: 0x0x0 1x1x0.1 N/A Measurements L x W x D (cm) 0 0.785 N/A A (cm) : rea 0 0.079 N/A Volume (cm) : 100.00% -523.00% N/A % Reduction in A rea: 100.00% -507.70% N/A % Reduction in Volume: Full Thickness Without Exposed Full Thickness Without Exposed N/A Classification: Support Structures Support Structures None Present Medium N/A Exudate Amount: N/A Serosanguineous N/A Exudate Type: N/A red, brown N/A Exudate Color: None Present (0%) None Present (0%) N/A Granulation Amount: None Present (0%) Large (67-100%) N/A Necrotic Amount: Fat Layer (Subcutaneous Tissue): Yes Fat Layer (Subcutaneous Tissue): Yes N/A Exposed Structures: Fascia: No Fascia: No Tendon: No Tendon: No Muscle: No Muscle:  No Joint: No Joint: No Bone: No Bone: No Large (67-100%) None N/A Epithelialization: Excoriation: No Excoriation: No N/A Periwound Skin Texture: Induration: No Induration: No Callus: No Callus: No Crepitus: No Crepitus: No Rash: No Rash: No Scarring: No Scarring: No Dry/Scaly: Yes Maceration: No N/A Periwound Skin Moisture: Maceration: No Dry/Scaly: No Ecchymosis: Yes Atrophie Blanche: No N/A Periwound Skin Color: Atrophie Blanche: No Cyanosis: No Cyanosis: No Ecchymosis: No Erythema: No Erythema: No Hemosiderin Staining: No Hemosiderin Staining: No Mottled: No Mottled: No Pallor: No Pallor: No Rubor: No Rubor: No N/A No Abnormality N/A Temperature: N/A Compression Therapy N/A Procedures Performed: Treatment Notes Electronic Signature(s) Signed: 12/31/2022 4:29:34 PM By: Geralyn Corwin DO Entered By: Geralyn Corwin on 12/31/2022 16:25:48 -------------------------------------------------------------------------------- Multi-Disciplinary Care Plan Details Patient Name: Date of Service: [name], [place] RNA 12/31/2022 3:30 PM [name] ([id number]) [id number] Page 4 of 9 Medical Record Number: [id number] Patient Account Number: [id number] Date of Birth/Sex: Treating RN: 05-Aug-1923 (89 y.o. Gevena Faulkner Primary Care Hattye Siegfried: Ardean Larsen Other Clinician: Referring Cadee Agro: Treating Avraj Lindroth/Extender: [name], [name] in Treatment: 6 Active Inactive Pain, Acute or Chronic Nursing Diagnoses: Pain, acute or chronic: actual or potential Potential alteration in comfort, pain Goals: Patient will verbalize adequate pain control and receive pain control interventions during procedures as needed Date Initiated: 11/13/2022 Date Inactivated: 12/18/2022 Target Resolution Date: 02/06/2023 Goal Status: Met Patient/caregiver will verbalize comfort level met Date Initiated: 11/13/2022 Target Resolution Date:  02/06/2023 Goal Status: Active Interventions: Assess comfort goal upon admission Encourage patient to take pain medications as prescribed Treatment Activities: Administer pain control measures as ordered : 11/13/2022 Notes: Venous Leg Ulcer Nursing Diagnoses: Knowledge deficit related to disease process and management Goals: Patient will maintain optimal edema control Date Initiated: 11/13/2022 Target Resolution Date: 02/06/2023 Goal Status: Active Interventions: Assess peripheral edema status every visit. Compression as ordered Provide education on venous insufficiency Notes: Wound/Skin Impairment Nursing Diagnoses: Knowledge deficit related to ulceration/compromised skin integrity Goals: Patient/caregiver will verbalize understanding of skin care regimen Date Initiated: 11/13/2022 Target Resolution Date: 02/06/2023 Goal Status: Active Interventions: Assess patient/caregiver ability to perform ulcer/skin care regimen upon admission and as needed Assess ulceration(s) every visit Provide education on ulcer and skin care Treatment Activities: Skin care regimen initiated : 11/13/2022 Topical wound management initiated : 11/13/2022 Notes: Electronic Signature(s) Signed: 01/01/2023 4:52:02 PM By: Brenton Grills Entered By: Brenton Grills on 12/31/2022 16:38:30 [name] ([id number]) [id number] Page 5 of 9 -------------------------------------------------------------------------------- Pain Assessment Details Patient Name: Date of ServiceCorinna [place]ulkner, [name] RNA 12/31/2022 3:30 PM Medical Record Number: [id number] Patient Account Number: [id number] Date of Birth/Sex: Treating RN: [birth year] (89 y.o.  F) Primary Care Kalif Kattner: Ardean Larsen Other Clinician: Referring Yarelli Decelles: Treating Delford Wingert/Extender: [name], [name] in Treatment: 6 Active Problems Location of Pain Severity and Description of Pain Patient Has Paino No Site  Locations Pain Management and Medication Current Pain Management: Notes Has pain at night Electronic Signature(s) Signed: 01/04/2023 11:18:15 AM By: Thayer Dallas Entered By: Thayer Dallas on 12/31/2022 15:47:26 -------------------------------------------------------------------------------- Patient/Caregiver Education Details Patient Name: Date of Service: [name], [place]on [place] RNA 6/24/2024andnbsp3:30 PM Medical Record Number: [id number] Patient Account Number: [id number] Date of Birth/Gender: Treating RN: 07/03/1924 (89 y.o. Gevena Faulkner Primary Care Physician: Ardean Larsen Other Clinician: Referring Physician: Treating Physician/Extender: [name]: 6 Education Assessment Education Provided To: Patient and Caregiver Education Topics Provided Wound/Skin ImpairmentKADAJAH, WILBOURN ([id number]) [id number] Page 6 of 9 Methods: Explain/Verbal Responses: State content correctly Electronic Signature(s) Signed: 01/01/2023 4:52:02 PM By: Brenton Grills Entered By: Brenton Grills on 12/31/2022 16:14:34 -------------------------------------------------------------------------------- Wound Assessment Details Patient Name: Date of Service: [name] RNA 12/31/2022 3:30 PM Medical Record Number: [id number] Patient Account Number: [id number] Date of Birth/Sex: Treating RN: 1924/03/10 (89 y.o. Gevena Faulkner Primary Care Elzabeth Mcquerry: Ardean Larsen Other Clinician: Referring Patrice Matthew: Treating Tatsuya Okray/Extender: [name], [name] in Treatment: 6 Wound Status Wound Number: 1 Primary Etiology: Venous Leg Ulcer Wound Location: Right, Lateral Ankle Wound Status: Healed - Epithelialized Wounding Event: Gradually Appeared Comorbid History: Asthma, Hypertension Date Acquired: 06/08/2022 Weeks Of Treatment: 6 Clustered Wound: No Photos Wound Measurements Length: (cm) Width: (cm) Depth: (cm) Area:  (cm) Volume: (cm) 0 % Reduction in Area: 100% 0 % Reduction in Volume: 100% 0 Epithelialization: Large (67-100%) 0 Tunneling: No 0 Undermining: No Wound Description Classification: Full Thickness Without Exposed Support Structures Exudate Amount: None Present Foul Odor After Cleansing: No Slough/Fibrino No Wound Bed Granulation Amount: None Present (0%) Exposed Structure Necrotic Amount: None Present (0%) Fascia Exposed: No Fat Layer (Subcutaneous Tissue) Exposed: Yes Tendon Exposed: No Muscle Exposed: No Joint Exposed: No Bone Exposed: No Periwound Skin Texture Texture Color No Abnormalities Noted: No No Abnormalities Noted: No Callus: No Atrophie Blanche: No Crepitus: No Cyanosis: No ARYAM, BARROS ([id number]) [id number] Page 7 of 9 Excoriation: No Ecchymosis: Yes Induration: No Erythema: No Rash: No Hemosiderin Staining: No Scarring: No Mottled: No Pallor: No Moisture Rubor: No No Abnormalities Noted: No Dry / Scaly: Yes Maceration: No Treatment Notes Wound #1 (Ankle) Wound Laterality: Right, Lateral Cleanser Peri-Wound Care Topical Primary Dressing Secondary Dressing Secured With Compression Wrap Compression Stockings Add-Ons Electronic Signature(s) Signed: 01/01/2023 4:52:02 PM By: Brenton Grills Entered By: Brenton Grills on 12/31/2022 16:20:46 -------------------------------------------------------------------------------- Wound Assessment Details Patient Name: Date of Service: [name], [place] RNA 12/31/2022 3:30 PM Medical Record Number: [id number] Patient Account Number: [id number] Date of Birth/Sex: Treating RN: 05-29-1924 (89 y.o. F) Primary Care Braylon Grenda: Ardean Larsen Other Clinician: Referring Jacorian Golaszewski: Treating Raymel Cull/Extender: [name], [name] in Treatment: 6 Wound Status Wound Number: 2 Primary Etiology: Venous Leg Ulcer Wound Location: Right, Medial Ankle Wound Status:  Open Wounding Event: Gradually Appeared Comorbid History: Asthma, Hypertension Date Acquired: 06/08/2022 Weeks Of Treatment: 6 Clustered Wound: No Photos Wound Measurements Length: (cm) 1 Width: (cm) 1 [name] ([id number]) Depth: (cm) Area: (cm) Volume: (cm) % Reduction in Area: -523% % Reduction in Volume: -507.7% [id number] Page 8 of 9 0.1 Epithelialization: None 0.785 Tunneling: No 0.079 Undermining: No Wound Description Classification: Full Thickness Without Exposed Support Structures Exudate Amount: Medium Exudate Type: Serosanguineous Exudate Color: red, brown Foul Odor After Cleansing:  No Slough/Fibrino Yes Wound Bed Granulation Amount: None Present (0%) Exposed Structure Necrotic Amount: Large (67-100%) Fascia Exposed: No Necrotic Quality: Adherent Slough Fat Layer (Subcutaneous Tissue) Exposed: Yes Tendon Exposed: No Muscle Exposed: No Joint Exposed: No Bone Exposed: No Periwound Skin Texture Texture Color No Abnormalities Noted: Yes No Abnormalities Noted: Yes Moisture Temperature / Pain No Abnormalities Noted: Yes Temperature: No Abnormality Treatment Notes Wound #2 (Ankle) Wound Laterality: Right, Medial Cleanser Soap and Water Discharge Instruction: May shower and wash wound with dial antibacterial soap and water prior to dressing change. Vashe 5.8 (oz) Discharge Instruction: Cleanse the wound with Vashe prior to applying a clean dressing using gauze sponges, not tissue or cotton balls. Peri-Wound Care Triamcinolone 15 (g) Discharge Instruction: Use triamcinolone 15 (g) as directed Sween Lotion (Moisturizing lotion) Discharge Instruction: Apply moisturizing lotion as directed Topical Gentamicin Discharge Instruction: As directed by physician Mupirocin Ointment Discharge Instruction: Apply Mupirocin (Bactroban) as instructed Primary Dressing Hydrofera Blue Ready Transfer Foam, 8x8 (in/in) Discharge Instruction:  Apply to wound bed as instructed Secondary Dressing ABD Pad, 8x10 Discharge Instruction: Apply over primary dressing as directed. Secured With Compression Wrap Kerlix Roll 4.5x3.1 (in/yd) Discharge Instruction: Apply Kerlix and Coban compression as directed. Coban Self-Adherent Wrap 4x5 (in/yd) Discharge Instruction: Apply over Kerlix as directed. Compression Stockings Add-Ons Electronic Signature(s) Signed: 01/04/2023 11:18:15 AM By: Thayer Dallas Entered By: Thayer Dallas on 12/31/2022 15:59:12 [name] ([id number]) [id number] Page 9 of 9 -------------------------------------------------------------------------------- Vitals Details Patient Name: Date of ServiceCorinna Faulkner, [name] RNA 12/31/2022 3:30 PM Medical Record Number: [id number] Patient Account Number: [id number] Date of Birth/Sex: Treating RN: Sep 14, 1923 (89 y.o. F) Primary Care Lillyanna Glandon: Ardean Larsen Other Clinician: Referring Zandrea Kenealy: Treating Chiron Campione/Extender: [name], [name] in Treatment: 6 Vital Signs Time Taken: 15:49 Temperature (F): 98.3 Height (in): 62 Pulse (bpm): 80 Weight (lbs): 120 Respiratory Rate (breaths/min): 18 Body Mass Index (BMI): 21.9 Blood Pressure (mmHg): 164/80 Reference Range: 80 - 120 mg / dl Electronic Signature(s) Signed: 01/04/2023 11:18:15 AM By: Thayer Dallas Entered By: Thayer Dallas on 12/31/2022 15:49:38

## 2023-01-08 NOTE — Progress Notes (Signed)
[name] ([id number]) [id number] Page 1 of 6 Visit Report for 12/11/2022 HPI Details Patient Name: Date of ServiceCorinna Faulkner, [name] RNA 12/11/2022 3:30 PM Medical Record Number: [id number] Patient Account Number: [id number] Date of Birth/Sex: Treating RN: January 09, 1924 (89 y.o. F) Primary Care Provider: Ardean Faulkner Other Clinician: Referring Provider: Treating Provider/Extender: [name], [name] in Treatment: 4 History of Present Illness HPI Description: ADMISSION 11/12/2021 This is a 89 year old independent woman who arrives for review of wounds on her lateral and medial ankle. It is a bit difficult to get a precise history here but it is quite clear that these have been present for many months. She states that she normally wore stockings however the wound laterally started to drain too much so she has not been wearing them. She has developed a circular erythematous area around the wounds on the lateral malleolus and her primary care doctor has given her samples of Nuzyra to use for cellulitis. Past medical history is actually very limited to hypertension, hearing loss. She had a rectal resection for cancer in 2008. In 2023 she had a small bowel obstruction. She also says she has a history of cellulitis of both legs ABIs in our clinic were difficult to obtain at 0.61 bilaterally 5/16; patient presents for follow-up. We have been using Hydrofera Blue under Kerlix/Coban to the right lower extremity. She is scheduled to have her ABIs done next week. She has no issues or complaints. She tolerated the wrap well. 5/28; patient presents for follow-up. We have been using Hydrofera Blue under Kerlix/Coban to the right lower extremity wounds. She took the wraps off to have her ABIs and TBI's completed. She has been without the wrap or dressings for the past 5 days. She has irritation to the right lateral leg. Her ABIs were noted to be 0.96 on the right  with monophasic waveforms throughout and a TBI of 0.3. 6/4; right medial malleolus and just behind the right lateral malleolus. We have been using topical antibiotics and Hydrofera Blue under kerlix Coban, can compression. There was a referral I think placed to [facility] and vascular for follow-up of her arterial studies Electronic Signature(s) Signed: 12/11/2022 4:47:09 PM By: Julie Najjar MD Entered By: [name] on 12/11/2022 16:21:33 -------------------------------------------------------------------------------- Physical Exam Details Patient Name: Date of Service: [name], [name] RNA 12/11/2022 3:30 PM Medical Record Number: [id number] Patient Account Number: [id number] Date of Birth/Sex: Treating RN: 08-08-1923 (89 y.o. F) Primary Care Provider: Ardean Faulkner Other Clinician: Referring Provider: Treating Provider/Extender: [name], [name] in Treatment: 4 Constitutional Patient is hypertensive.. Pulse regular and within target range for patient.Marland Kitchen Respirations regular, non-labored and within target range.. Temperature is normal and within the target range for the patient.Marland Kitchen Appears in no distress. Cardiovascular Good edema control. Notes Wound exam; small punched out area over the right medial malleolus surface of this looks clean. The area just posterior to the right lateral malleolus is superficial and this looks like it is progressing towards closure. No evidence of infection. Significant stasis dermatitis and varicosities BLISS, [name] ([id number]) [id number] Page 2 of 6 Electronic Signature(s) Signed: 12/11/2022 4:47:09 PM By: Julie Najjar MD Entered By: [name] on 12/11/2022 16:22:47 -------------------------------------------------------------------------------- Physician Orders Details Patient Name: Date of Service: [name], [name] RNA 12/11/2022 3:30 PM Medical Record Number: [id number] Patient Account Number:  [id number] Date of Birth/Sex: Treating RN: 1924-05-21 (89 y.o. 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 - Dr. Mikey Faulkner Dr. Leanord Faulkner Return A Other: - Patient to remove compression wrap Thursday morning, cover wound with a bandage, go to [facility] your [facility], then return Friday morning to wound center. Anesthetic (In clinic) Topical Lidocaine 4% applied to wound bed Bathing/ Shower/ Hygiene May shower with protection but do not get wound dressing(s) wet. Protect dressing(s) with water repellant cover (for example, large plastic bag) or a cast cover and may then take shower. Edema Control - Lymphedema / SCD / Other Elevate legs to the level of the heart or above for 30 minutes daily and/or when sitting for 3-4 times a day throughout the day. Avoid standing for long periods of time. Exercise regularly Wound Treatment Wound #1 - Ankle Wound Laterality: Right, Lateral Cleanser: Soap and Water 1 x Per Week/30 Days Discharge Instructions: May shower and wash wound with dial antibacterial soap and water prior to dressing change. Cleanser: Vashe 5.8 (oz) 1 x Per Week/30 Days Discharge Instructions: Cleanse the wound with Vashe prior to applying a clean dressing using gauze sponges, not tissue or cotton balls. Peri-Wound Care: Triamcinolone 15 (g) 1 x Per Week/30 Days Discharge Instructions: Use triamcinolone 15 (g) as  directed Peri-Wound Care: Sween Lotion (Moisturizing lotion) 1 x Per Week/30 Days Discharge Instructions: Apply moisturizing lotion as directed Topical: Gentamicin 1 x Per Week/30 Days Discharge Instructions: As directed by physician Topical: Mupirocin Ointment 1 x Per Week/30 Days Discharge Instructions: Apply Mupirocin (Bactroban) as instructed Prim Dressing: Hydrofera Blue Ready Transfer Foam, 8x8 (in/in) 1 x Per Week/30 Days ary Discharge Instructions: Apply to wound bed as instructed Secondary Dressing: ABD Pad, 8x10 1 x Per Week/30 Days Discharge Instructions: Apply over primary dressing as directed. Compression Wrap: Kerlix Roll 4.5x3.1 (in/yd) 1 x Per Week/30 Days Discharge Instructions: Apply Kerlix and Coban compression as directed. [name] ([id number]) [id number] Page 3 of 6 Compression Wrap: Coban Self-Adherent Wrap 4x5 (in/yd) 1 x Per Week/30 Days Discharge Instructions: Apply over Kerlix as directed. Wound #2 - Ankle Wound Laterality: Right, Medial Cleanser: Soap and Water 1 x Per Week/30 Days Discharge Instructions: May shower and wash wound with dial antibacterial soap and water prior to dressing change. Cleanser: Vashe 5.8 (oz) 1 x Per Week/30 Days Discharge Instructions: Cleanse the wound with Vashe prior to applying a clean dressing using gauze sponges, not tissue or cotton balls. Peri-Wound Care: Triamcinolone 15 (g) 1 x Per Week/30 Days Discharge Instructions: Use triamcinolone 15 (g) as directed Peri-Wound Care: Sween Lotion (Moisturizing lotion) 1 x Per Week/30 Days Discharge Instructions: Apply moisturizing lotion as directed Topical: Gentamicin 1 x Per Week/30 Days Discharge Instructions: As directed by physician Topical: Mupirocin Ointment 1 x Per Week/30 Days Discharge Instructions: Apply Mupirocin (Bactroban) as instructed Prim Dressing: Hydrofera Blue Ready Transfer Foam, 8x8 (in/in) 1 x Per Week/30 Days ary Discharge  Instructions: Apply to wound bed as instructed Secondary Dressing: ABD Pad, 8x10 1 x Per Week/30 Days Discharge Instructions: Apply over primary dressing as directed. Compression Wrap: Kerlix Roll 4.5x3.1 (in/yd) 1 x Per Week/30 Days Discharge Instructions: Apply Kerlix and Coban compression as directed. Compression Wrap: Coban Self-Adherent Wrap 4x5 (in/yd) 1  x Per Week/30 Days Discharge Instructions: Apply over Kerlix as directed. Consults Vascular Surgery - Evaluate and Treat following results from Arterial Studies. - (ICD10 L97.311 - Non-pressure chronic ulcer of right ankle limited to breakdown of skin) Electronic Signature(s) Signed: 12/11/2022 4:42:41 PM By: Karie Schwalbe RN Signed: 12/11/2022 4:47:09 PM By: Julie Najjar MD Previous Signature: 12/11/2022 4:19:42 PM Version By: Karie Schwalbe RN Entered By: Karie Schwalbe on 12/11/2022 16:40:36 -------------------------------------------------------------------------------- Problem List Details Patient Name: Date of Service: [name], [name] RNA 12/11/2022 3:30 PM Medical Record Number: [id number] Patient Account Number: [id number] Date of Birth/Sex: Treating RN: [birth year] (89 y.o. 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 F) Primary Care Provider: Ardean Faulkner Other Clinician: Referring Provider: Treating Provider/Extender: [name], [name] in Treatment: 4 Subjective History of Present Illness (HPI) ADMISSION 11/12/2021 This is a 89 year old independent woman who arrives for review of wounds on her lateral and medial ankle. It is a bit difficult to get a precise history here but it is quite clear that these have been present for many months. She states that she normally wore stockings however the wound laterally started to drain too much so she has not been wearing them. She has developed a circular erythematous area around the wounds on the lateral malleolus and her primary care doctor has given her samples of Nuzyra to use for cellulitis. Past medical history is actually very limited to hypertension, hearing loss. She had a rectal resection for cancer in 2008. In 2023 she had a small bowel obstruction. She also says she has a history of cellulitis of both legs ABIs in our clinic were difficult to obtain at 0.61 bilaterally 5/16; patient presents for follow-up. We have been using Hydrofera Blue under Kerlix/Coban to the right lower extremity. She is scheduled to have her ABIs done next week. She has no issues or complaints. She tolerated the wrap well. 5/28; patient presents for follow-up. We have been using Hydrofera Blue under Kerlix/Coban to the right lower extremity wounds. She took the wraps off to have her ABIs and TBI's completed. She has been without the wrap or dressings for the past 5 days. She has irritation  to the right lateral leg. Her ABIs were noted to be 0.96 on the right with monophasic waveforms throughout and a TBI of 0.3. 6/4; right medial malleolus and just behind the right lateral malleolus. We have been using topical antibiotics and Hydrofera Blue under kerlix Coban, can compression. There was a referral I think placed to [facility] and vascular for follow-up of her arterial studies Objective Constitutional Patient is hypertensive.. Pulse regular and within target range for patient.Marland Kitchen Respirations regular, non-labored and within target range.. Temperature is normal and  within the target range for the patient.Marland Kitchen Appears in no distress. Vitals Time Taken: 3:42 PM, Height: 62 in, Weight: 120 lbs, BMI: 21.9, Temperature: 98.6 F, Pulse: 81 bpm, Respiratory Rate: 16 breaths/min, Blood Pressure: 180/76 mmHg. Cardiovascular [name], [name] ([id number]) [id number] Page 5 of 6 Good edema control. General Notes: Wound exam; small punched out area over the right medial malleolus surface of this looks clean. The area just posterior to the right lateral malleolus is superficial and this looks like it is progressing towards closure. No evidence of infection. Significant stasis dermatitis and varicosities Integumentary (Hair, Skin) Wound #1 status is Open. Original cause of wound was Gradually Appeared. The date acquired was: 06/08/2022. The wound has been in treatment 4 weeks. The wound is located on the Right,Lateral Ankle. The wound measures 3.5cm length x 1.2cm width x 0.1cm depth; 3.299cm^2 area and 0.33cm^3 volume. There is Fat Layer (Subcutaneous Tissue) exposed. There is no tunneling or undermining noted. There is a medium amount of serosanguineous drainage noted. There is medium (34-66%) pink granulation within the wound bed. There is a medium (34-66%) amount of necrotic tissue within the wound bed including Eschar and Adherent Slough. The periwound skin appearance exhibited:  Dry/Scaly, Maceration, Ecchymosis. The periwound skin appearance did not exhibit: Callus, Crepitus, Excoriation, Induration, Rash, Scarring, Atrophie Blanche, Cyanosis, Hemosiderin Staining, Mottled, Pallor, Rubor, Erythema. Wound #2 status is Open. Original cause of wound was Gradually Appeared. The date acquired was: 06/08/2022. The wound has been in treatment 4 weeks. The wound is located on the Right,Medial Ankle. The wound measures 1.2cm length x 0.9cm width x 0.1cm depth; 0.848cm^2 area and 0.085cm^3 volume. There is Fat Layer (Subcutaneous Tissue) exposed. There is no tunneling or undermining noted. There is a medium amount of serosanguineous drainage noted. There is small (1-33%) pink granulation within the wound bed. There is a large (67-100%) amount of necrotic tissue within the wound bed including Adherent Slough. The periwound skin appearance had no abnormalities noted for texture. The periwound skin appearance had no abnormalities noted for moisture. The periwound skin appearance had no abnormalities noted for color. Periwound temperature was noted as No Abnormality. 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 - Dr. Mikey Faulkner Dr. Leanord Faulkner Other: - Patient to remove compression wrap Thursday morning, cover wound with a bandage, go to have your [facility], then return Friday morning to wound center. Anesthetic: (In clinic) Topical Lidocaine 4% applied to wound bed Bathing/ Shower/ Hygiene: May shower with protection but do not get wound dressing(s) wet. Protect dressing(s) with water repellant cover (for example, large plastic bag) or a cast  cover and may then take shower. Edema Control - Lymphedema / SCD / Other: Elevate legs to the level of the heart or above for 30 minutes daily and/or when sitting for 3-4 times a day throughout the day. Avoid standing for long periods of time. Exercise regularly Consults ordered were: Vascular Surgery - Evaluate and Treat following results from Arterial Studies. WOUND #1: - Ankle Wound Laterality: Right, Lateral Cleanser: Soap and Water 1 x Per Week/30 Days Discharge Instructions: May shower and wash wound with dial antibacterial soap and water prior to dressing change. Cleanser: Vashe 5.8 (oz) 1 x Per Week/30 Days Discharge Instructions: Cleanse the wound  with Vashe prior to applying a clean dressing using gauze sponges, not tissue or cotton balls. Peri-Wound Care: Triamcinolone 15 (g) 1 x Per Week/30 Days Discharge Instructions: Use triamcinolone 15 (g) as directed Peri-Wound Care: Sween Lotion (Moisturizing lotion) 1 x Per Week/30 Days Discharge Instructions: Apply moisturizing lotion as directed Topical: Gentamicin 1 x Per Week/30 Days Discharge Instructions: As directed by physician Topical: Mupirocin Ointment 1 x Per Week/30 Days Discharge Instructions: Apply Mupirocin (Bactroban) as instructed Prim Dressing: Hydrofera Blue Ready Transfer Foam, 8x8 (in/in) 1 x Per Week/30 Days ary Discharge Instructions: Apply to wound bed as instructed Secondary Dressing: ABD Pad, 8x10 1 x Per Week/30 Days Discharge Instructions: Apply over primary dressing as directed. Com pression Wrap: Kerlix Roll 4.5x3.1 (in/yd) 1 x Per Week/30 Days Discharge Instructions: Apply Kerlix and Coban compression as directed. Com pression Wrap: Coban Self-Adherent Wrap 4x5 (in/yd) 1 x Per Week/30 Days Discharge Instructions: Apply over Kerlix as directed. WOUND #2: - Ankle Wound Laterality: Right, Medial Cleanser: Soap and Water 1 x Per Week/30 Days Discharge Instructions: May shower and wash wound with dial  antibacterial soap and water prior to dressing change. Cleanser: Vashe 5.8 (oz) 1 x Per Week/30 Days Discharge Instructions: Cleanse the wound with Vashe prior to applying a clean dressing using gauze sponges, not tissue or cotton balls. Peri-Wound Care: Triamcinolone 15 (g) 1 x Per Week/30 Days Discharge Instructions: Use triamcinolone 15 (g) as directed Peri-Wound Care: Sween Lotion (Moisturizing lotion) 1 x Per Week/30 Days Discharge Instructions: Apply moisturizing lotion as directed Topical: Gentamicin 1 x Per Week/30 Days Discharge Instructions: As directed by physician Topical: Mupirocin Ointment 1 x Per Week/30 Days Discharge Instructions: Apply Mupirocin (Bactroban) as instructed [name] ([id number]) [id number] Page 6 of 6 Prim Dressing: Hydrofera Blue Ready Transfer Foam, 8x8 (in/in) 1 x Per Week/30 Days ary Discharge Instructions: Apply to wound bed as instructed Secondary Dressing: ABD Pad, 8x10 1 x Per Week/30 Days Discharge Instructions: Apply over primary dressing as directed. Com pression Wrap: Kerlix Roll 4.5x3.1 (in/yd) 1 x Per Week/30 Days Discharge Instructions: Apply Kerlix and Coban compression as directed. Com pression Wrap: Coban Self-Adherent Wrap 4x5 (in/yd) 1 x Per Week/30 Days Discharge Instructions: Apply over Kerlix as directed. 1. I continued with the same dressing under kerlix Coban compression. This is topical antibiotics and Hydrofera Blue. 2. Consult to [facility] and vascular reinitiated in follow-up of her previously done arterial studies Electronic Signature(s) Signed: 12/11/2022 4:42:41 PM By: Karie Schwalbe RN Signed: 12/11/2022 4:47:09 PM By: Julie Najjar MD Entered By: Karie Schwalbe on 12/11/2022 16:40:56 -------------------------------------------------------------------------------- SuperBill Details Patient Name: Date of Service: [name], [name] RNA 12/11/2022 Medical Record Number: [id number] Patient Account  Number: [id number] Date of Birth/Sex: Treating RN: 04/20/1924 (89 y.o. 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 I10 Essential (primary) hypertension Facility Procedures : CPT4 Code: [id number] Description: 99213 - WOUND CARE VISIT-LEV 3 EST PT Modifier: Quantity: 1 Physician Procedures : CPT4 Code Description Modifier [id number] 99213 - WC PHYS LEVEL 3 - EST PT ICD-10 Diagnosis Description 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

## 2023-01-09 ENCOUNTER — Encounter (HOSPITAL_BASED_OUTPATIENT_CLINIC_OR_DEPARTMENT_OTHER): Payer: 59 | Attending: Physician Assistant | Admitting: Physician Assistant

## 2023-01-09 DIAGNOSIS — I872 Venous insufficiency (chronic) (peripheral): Secondary | ICD-10-CM | POA: Insufficient documentation

## 2023-01-09 DIAGNOSIS — I87331 Chronic venous hypertension (idiopathic) with ulcer and inflammation of right lower extremity: Secondary | ICD-10-CM | POA: Diagnosis present

## 2023-01-09 DIAGNOSIS — L97311 Non-pressure chronic ulcer of right ankle limited to breakdown of skin: Secondary | ICD-10-CM | POA: Diagnosis not present

## 2023-01-09 DIAGNOSIS — I70233 Atherosclerosis of native arteries of right leg with ulceration of ankle: Secondary | ICD-10-CM | POA: Insufficient documentation

## 2023-01-09 DIAGNOSIS — I10 Essential (primary) hypertension: Secondary | ICD-10-CM | POA: Diagnosis not present

## 2023-01-11 NOTE — Progress Notes (Signed)
Friant, [name] ([id number]) [id number] Page 1 of 1 Visit Report for 01/09/2023 SuperBill Details Patient Name: Date of ServiceCorinna Lines, [name] RNA 01/09/2023 Medical Record Number: [id number] Patient Account Number: [id number] Date of Birth/Sex: Treating RN: 03/15/1924 (89 y.o. 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

## 2023-01-15 ENCOUNTER — Encounter (HOSPITAL_BASED_OUTPATIENT_CLINIC_OR_DEPARTMENT_OTHER): Payer: 59 | Attending: Internal Medicine | Admitting: Internal Medicine

## 2023-01-15 DIAGNOSIS — I10 Essential (primary) hypertension: Secondary | ICD-10-CM | POA: Insufficient documentation

## 2023-01-15 DIAGNOSIS — L97311 Non-pressure chronic ulcer of right ankle limited to breakdown of skin: Secondary | ICD-10-CM | POA: Insufficient documentation

## 2023-01-15 DIAGNOSIS — I87331 Chronic venous hypertension (idiopathic) with ulcer and inflammation of right lower extremity: Secondary | ICD-10-CM | POA: Diagnosis not present

## 2023-01-15 DIAGNOSIS — I70233 Atherosclerosis of native arteries of right leg with ulceration of ankle: Secondary | ICD-10-CM | POA: Insufficient documentation

## 2023-01-16 NOTE — Progress Notes (Signed)
[place], [name] ([id number]) [id number] Page 1 of 1 Visit Report for 01/15/2023 SuperBill Details Patient Name: Date of ServiceCorinna Faulkner, [name] RNA 01/15/2023 Medical Record Number: [id number] Patient Account Number: [id number] Date of Birth/Sex: Treating RN: 1924/02/15 (89 y.o. 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

## 2023-01-16 NOTE — Progress Notes (Signed)
[place], [name] ([id number]) [id number] Page 1 of 4 Visit Report for 01/15/2023 Arrival Information Details Patient Name: Date of ServiceTorrie Faulkner RNA 01/15/2023 3:45 PM Medical Record Number: [id number] Patient Account Number: [id number] Date of Birth/Sex: Treating RN: Apr 01, 1924 (89 y.o. [name] Primary Care Elfie Costanza: Ardean Larsen Other Clinician: Referring Tamia Dial: Treating Hardeep Reetz/Extender: [name]: 9 Visit Information History Since Last Visit All ordered tests and consults were completed: Yes Patient Arrived: Ambulatory Added or deleted any medications: No Arrival Time: 15:59 Any new allergies or adverse reactions: No Accompanied By: daughter Had a fall or experienced change in No Transfer Assistance: None activities of daily living that may affect Patient Identification Verified: Yes risk of falls: Secondary Verification Process Completed: Yes Signs or symptoms of abuse/neglect since last visito No Patient Requires Transmission-Based Precautions: No Hospitalized since last visit: No Patient Has Alerts: Yes Implantable device outside of the clinic excluding No Patient Alerts: ABI R 0.96 (11/29/22) cellular tissue based products placed in the center ABI L 1.25 (11/29/22) since last visit: Has Dressing in Place as Prescribed: Yes Pain Present Now: No Electronic Signature(s) Signed: 01/16/2023 4:28:57 PM By: Brenton Grills Entered By: Brenton Grills on 01/15/2023 15:59:41 -------------------------------------------------------------------------------- Compression Therapy Details Patient Name: Date of ServiceTorrie Faulkner RNA 01/15/2023 3:45 PM Medical Record Number: [id number] Patient Account Number: [id number] Date of Birth/Sex: Treating RN: [birth year] (89 y.o. [name] Primary Care Marzella Miracle: Ardean Larsen Other Clinician: Referring Larance Ratledge: Treating Drue Camera/Extender: [name], [name] in Treatment: 9 Compression Therapy Performed for Wound Assessment: Wound #2 Right,Medial Ankle Performed By: Leighton Parody, RN Compression Type: Three Layer Electronic Signature(s) Signed: 01/16/2023 4:28:57 PM By: Brenton Grills Entered By: Brenton Grills on 01/15/2023 16:00:54 -------------------------------------------------------------------------------- Encounter Discharge Information Details Patient Name: Date of Service: [name], LO RNA 01/15/2023 3:45 PM Medical Record Number: [id number] Patient Account Number: [id number] [name], [name] ([id number]) [id number] Page 2 of 4 Date of Birth/Sex: Treating RN: [birth year] (89 y.o. [name] Primary Care Asusena Sigley: Other Clinician: Ardean Larsen Referring Esterlene Atiyeh: Treating Stephen Baruch/Extender: [name]: 9 Encounter Discharge Information Items Discharge Condition: Stable Ambulatory Status: Ambulatory Discharge Destination: Home Transportation: Private Auto Accompanied By: daughter Schedule Follow-up Appointment: Yes Clinical Summary of Care: Patient Declined Electronic Signature(s) Signed: 01/16/2023 4:28:57 PM By: Brenton Grills Entered By: Brenton Grills on 01/15/2023 16:01:49 -------------------------------------------------------------------------------- Patient/Caregiver Education Details Patient Name: Date of Service: [name], Marton Redwood RNA 7/9/2024andnbsp3:45 PM Medical Record Number: [id number] Patient Account Number: [id number] Date of Birth/Gender: Treating RN: [birth year] (89 y.o. [name] Primary Care Physician: Ardean Larsen Other Clinician: Referring Physician: Treating Physician/Extender: [name]: 9 Education Assessment Education Provided To: Patient and Caregiver Education Topics Provided Wound/Skin Impairment: Methods: Explain/Verbal Responses:  State content correctly Electronic Signature(s) Signed: 01/16/2023 4:28:57 PM By: Brenton Grills Entered By: Brenton Grills on 01/15/2023 16:01:20 -------------------------------------------------------------------------------- Wound Assessment Details Patient Name: Date of Service: [name] RNA 01/15/2023 3:45 PM Medical Record Number: [id number] Patient Account Number: [id number] Date of Birth/Sex: Treating RN: 01-08-1924 (89 y.o. [name] Primary Care Aminata [place]: Ardean Larsen Other Clinician: Referring Tanae Petrosky: Treating Arian Murley/Extender: [name], [name] in Treatment: 9 Wound Status Wound Number: 2 Primary Etiology: Venous Leg Ulcer Wound Location: Right, Medial Ankle Wound Status: Open Wounding Event: Gradually Appeared Comorbid History: Asthma, Hypertension Date Acquired: 06/08/2022 [name] ([id number]) [id number] Page 3 of 4 Weeks Of Treatment: 9 Clustered Wound: No Wound Measurements Length: (cm)  1 Width: (cm) 1 Depth: (cm) 0.1 Area: (cm) 0.785 Volume: (cm) 0.079 % Reduction in Area: -523% % Reduction in Volume: -507.7% Tunneling: No Undermining: No Wound Description Classification: Full Thickness Without Exposed Suppor Exudate Amount: Medium Exudate Type: Serosanguineous Exudate Color: red, brown t Structures Periwound Skin Texture Texture Color No Abnormalities Noted: No No Abnormalities Noted: No Moisture No Abnormalities Noted: No Treatment Notes Wound #2 (Ankle) Wound Laterality: Right, Medial Cleanser Soap and Water Discharge Instruction: May shower and wash wound with dial antibacterial soap and water prior to dressing change. Vashe 5.8 (oz) Discharge Instruction: Cleanse the wound with Vashe prior to applying a clean dressing using gauze sponges, not tissue or cotton balls. Peri-Wound Care Triamcinolone 15 (g) Discharge Instruction: Use triamcinolone 15 (g) as directed Sween Lotion  (Moisturizing lotion) Discharge Instruction: Apply moisturizing lotion as directed Topical Gentamicin Discharge Instruction: As directed by physician Mupirocin Ointment Discharge Instruction: Apply Mupirocin (Bactroban) as instructed Primary Dressing Hydrofera Blue Ready Transfer Foam, 8x8 (in/in) Discharge Instruction: Apply to wound bed as instructed Secondary Dressing ABD Pad, 8x10 Discharge Instruction: Apply over primary dressing as directed. Secured With Compression Wrap Kerlix Roll 4.5x3.1 (in/yd) Discharge Instruction: Apply Kerlix and Coban compression as directed. Coban Self-Adherent Wrap 4x5 (in/yd) Discharge Instruction: Apply over Kerlix as directed. Compression Stockings Add-Ons Electronic Signature(s) Signed: 01/16/2023 4:28:57 PM By: Brenton Grills Entered By: Brenton Grills on 01/15/2023 16:00:26 [name] ([id number]) [id number] Page 4 of 4 -------------------------------------------------------------------------------- Vitals Details Patient Name: Date of ServiceTorrie Faulkner RNA 01/15/2023 3:45 PM Medical Record Number: [id number] Patient Account Number: [id number] Date of Birth/Sex: Treating RN: 11-12-1923 (89 y.o. [name] Primary Care Brolin Dambrosia: Ardean Larsen Other Clinician: Referring Saanya Zieske: Treating Norm Wray/Extender: [name], [name] in Treatment: 9 Vital Signs Time Taken: 15:40 Temperature (F): 98.2 Height (in): 62 Pulse (bpm): 78 Weight (lbs): 120 Respiratory Rate (breaths/min): 18 Body Mass Index (BMI): 21.9 Blood Pressure (mmHg): 132/74 Reference Range: 80 - 120 mg / dl Electronic Signature(s) Signed: 01/16/2023 4:28:57 PM By: Brenton Grills Entered By: Brenton Grills on 01/15/2023 16:00:06

## 2023-01-22 ENCOUNTER — Encounter (HOSPITAL_BASED_OUTPATIENT_CLINIC_OR_DEPARTMENT_OTHER): Payer: 59 | Admitting: Internal Medicine

## 2023-01-22 DIAGNOSIS — L97311 Non-pressure chronic ulcer of right ankle limited to breakdown of skin: Secondary | ICD-10-CM | POA: Diagnosis not present

## 2023-01-22 DIAGNOSIS — I70233 Atherosclerosis of native arteries of right leg with ulceration of ankle: Secondary | ICD-10-CM | POA: Diagnosis not present

## 2023-01-22 DIAGNOSIS — I10 Essential (primary) hypertension: Secondary | ICD-10-CM | POA: Diagnosis not present

## 2023-01-22 DIAGNOSIS — I87331 Chronic venous hypertension (idiopathic) with ulcer and inflammation of right lower extremity: Secondary | ICD-10-CM | POA: Diagnosis not present

## 2023-01-22 NOTE — Progress Notes (Signed)
[place], [name] ([id number]) [id number] Page 1 of 7 Visit Report for 01/22/2023 Chief Complaint Document Details Patient Name: Date of Service: [name], [name] Faulkner 01/22/2023 3:45 PM Medical Record Number: [id number] Patient Account Number: [id number] Date of Birth/Sex: Treating RN: 1924/02/24 (89 y.o. F) Primary Care Provider: Ardean Larsen Other Clinician: Referring Provider: Treating Provider/Extender: [name], [name] in Treatment: 10 Information Obtained from: Patient Chief Complaint 11/13/2022; patient is here for review of wounds on her right lateral and right medial ankle Electronic Signature(s) Signed: 01/22/2023 4:51:26 PM By: Geralyn Corwin DO Entered By: Geralyn Corwin on 01/22/2023 16:44:25 -------------------------------------------------------------------------------- HPI Details Patient Name: Date of Service: [name], [name] 01/22/2023 3:45 PM Medical Record Number: [id number] Patient Account Number: [id number] Date of Birth/Sex: Treating RN: 25-Jan-1924 (89 y.o. F) Primary Care Provider: Ardean Larsen Other Clinician: Referring Provider: Treating Provider/Extender: [name], [name] in Treatment: 10 History of Present Illness HPI Description: ADMISSION 11/12/2021 This is a 89 year old independent woman who arrives for review of wounds on her lateral and medial ankle. It is a bit difficult to get a precise history here but it is quite clear that these have been present for many months. She states that she normally wore stockings however the wound laterally started to drain too much so she has not been wearing them. She has developed a circular erythematous area around the wounds on the lateral malleolus and her primary care doctor has given her samples of Nuzyra to use for cellulitis. Past medical history is actually very limited to hypertension, hearing loss. She had a rectal resection for cancer in  2008. In 2023 she had a small bowel obstruction. She also says she has a history of cellulitis of both legs ABIs in our clinic were difficult to obtain at 0.61 bilaterally 5/16; patient presents for follow-up. We have been using Hydrofera Blue under Kerlix/Coban to the right lower extremity. She is scheduled to have her ABIs done next week. She has no issues or complaints. She tolerated the wrap well. 5/28; patient presents for follow-up. We have been using Hydrofera Blue under Kerlix/Coban to the right lower extremity wounds. She took the wraps off to have her ABIs and TBI's completed. She has been without the wrap or dressings for the past 5 days. She has irritation to the right lateral leg. Her ABIs were noted to be 0.96 on the right with monophasic waveforms throughout and a TBI of 0.3. 6/4; right medial malleolus and just behind the right lateral malleolus. We have been using topical antibiotics and Hydrofera Blue under kerlix Coban, can compression. There was a referral I think placed to vein and vascular for follow-up of her arterial studies 6/11; patient presents for follow-up. We have been using antibiotic ointment and Hydrofera Blue under Kerlix/Coban to the right lateral and medial feet wounds. She still has not heard from vein and vascular for follow-up of arterial study results. 6/24; patient presents for follow-up. We have been using antibiotic ointment and Hydrofera Blue under Kerlix/Coban to the feet wounds. The right lateral foot wound has healed. She has been contacted by vein and vascular however patient does not want to follow-up. 7/16; patient presents for follow-up. We have been using antibiotic ointment with Hydrofera Blue under Kerlix/Coban. The medial right ankle wound is smaller. [place], [name] ([id number]) [id number] Page 2 of 7 Electronic Signature(s) Signed: 01/22/2023 4:51:26 PM By: Geralyn Corwin DO Entered By: Geralyn Corwin on  01/22/2023 16:45:23 -------------------------------------------------------------------------------- Physical Exam Details Patient Name: Date of Service: [place]  [place], [name] 01/22/2023 3:45 PM Medical Record Number: [id number] Patient Account Number: [id number] Date of Birth/Sex: Treating RN: 1923-08-21 (89 y.o. F) Primary Care Provider: Ardean Larsen Other Clinician: Referring Provider: Treating Provider/Extender: [name], [name] in Treatment: 10 Constitutional respirations regular, non-labored and within target range for patient.. Cardiovascular 2+ dorsalis pedis/posterior tibialis pulses. Psychiatric pleasant and cooperative. Notes Small open wound to the right medial malleolus with granulation tissue and scant nonviable tissue. No signs of infection. Significant venous stasis dermatitis and varicosities throughout the leg. Good edema control. Electronic Signature(s) Signed: 01/22/2023 4:51:26 PM By: Geralyn Corwin DO Entered By: Geralyn Corwin on 01/22/2023 16:45:59 -------------------------------------------------------------------------------- Physician Orders Details Patient Name: Date of Service: [name], [name] 01/22/2023 3:45 PM Medical Record Number: [id number] Patient Account Number: [id number] Date of Birth/Sex: Treating RN: [birth year] (89 y.o. 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 - Dr. Mikey Bussing  room 7 330pm 01/28/2023 Monday Return A ppointment in 2 weeks. - Dr. Mikey Bussing 345pm Monday 02/04/2023 Room 9 Return A Other: - Vein and Vascular appt 02/13/2023 ensure you go. Anesthetic (In clinic) Topical Lidocaine 4% applied to wound bed [place], [name] ([id number]) [phone].pdf Page 3 of 7 Bathing/ Shower/ Hygiene May shower with protection but do not get wound dressing(s) wet. Protect dressing(s) with water repellant cover (for example, large plastic bag) or a cast cover and may then take shower. Edema Control - Lymphedema / SCD / Other Elevate legs to the level of the heart or above for 30 minutes daily and/or when sitting for 3-4 times a day throughout the day. Avoid standing for long periods of time. Exercise regularly Wound Treatment Wound #2 - Ankle Wound Laterality: Right, Medial Cleanser: Soap and Water 1 x Per Week/30 Days Discharge Instructions: May shower and wash wound with dial antibacterial soap and water prior to dressing change. Cleanser: Vashe 5.8 (oz) 1 x Per Week/30 Days Discharge Instructions: Cleanse the wound with Vashe prior to applying a clean dressing using gauze sponges, not tissue or cotton balls. Peri-Wound Care: Triamcinolone 15 (g) 1 x Per Week/30 Days Discharge Instructions: Use triamcinolone 15 (g) as directed Peri-Wound Care: Sween Lotion (Moisturizing lotion) 1 x Per Week/30 Days Discharge Instructions: Apply moisturizing lotion as directed Topical: Gentamicin 1 x Per Week/30 Days Discharge Instructions: As directed by physician Topical: Mupirocin Ointment 1 x Per Week/30 Days Discharge Instructions: Apply Mupirocin (Bactroban) as instructed Prim Dressing: Hydrofera Blue Ready Transfer Foam, 8x8 (in/in) 1 x Per Week/30 Days ary Discharge Instructions: Apply to wound bed as instructed Secondary Dressing: ABD Pad, 8x10 1 x Per Week/30 Days Discharge Instructions: Apply over primary dressing as directed. Compression Wrap: Kerlix  Roll 4.5x3.1 (in/yd) 1 x Per Week/30 Days Discharge Instructions: Apply Kerlix and Coban compression as directed. Compression Wrap: Coban Self-Adherent Wrap 4x5 (in/yd) 1 x Per Week/30 Days Discharge Instructions: Apply over Kerlix as directed. Electronic Signature(s) Signed: 01/22/2023 4:51:26 PM By: Geralyn Corwin DO Entered By: Geralyn Corwin on 01/22/2023 16:46:06 -------------------------------------------------------------------------------- Problem List Details Patient Name: Date of Service: [name], [name] 01/22/2023 3:45 PM Medical Record Number: [id number]  Patient Account Number: [id number] Date of Birth/Sex: Treating RN: 1924-03-11 (89 y.o. 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 F) Primary Care Provider: Ardean Larsen Other Clinician: Referring Provider: Treating  Provider/Extender: [name], [name] in Treatment: 10 Subjective Chief Complaint Information obtained from Patient 11/13/2022; patient is here for review of wounds on her right lateral and right medial ankle History of Present Illness (HPI) ADMISSION 11/12/2021 This is a 89 year old independent woman who arrives for review of wounds on her lateral and medial ankle. It is a bit difficult to get a precise history here but it is quite clear that these have been present for many months. She states that she normally wore stockings however the wound laterally started to drain too much so she has not been wearing them. She has developed a circular erythematous area around the wounds on the lateral malleolus and her primary care doctor has given her samples of Nuzyra to use for cellulitis. Past medical history is actually very limited to hypertension, hearing loss. She had a rectal resection for cancer in 2008. In 2023 she had a small bowel obstruction. She also says she has a history of cellulitis of both legs ABIs in our clinic were difficult to obtain at 0.61 bilaterally 5/16; patient presents for follow-up. We have been using Hydrofera Blue under Kerlix/Coban to the right lower extremity. She is scheduled to have her ABIs done next week. She has no issues or complaints. She tolerated the wrap well. 5/28; patient presents for follow-up. We have been using Hydrofera Blue under Kerlix/Coban to the right lower extremity wounds. She took the wraps off to have her ABIs and TBI's completed. She has been without the wrap or dressings for the past 5 days. She has irritation to the right lateral leg. Her ABIs were noted to be 0.96 on the right with monophasic waveforms throughout and a TBI of 0.3. 6/4; right medial malleolus and just behind the right lateral malleolus. We have been using topical antibiotics and Hydrofera Blue under kerlix Coban, can compression. There was a referral I think  placed to vein and vascular for follow-up of her arterial studies 6/11; patient presents for follow-up. We have been using antibiotic ointment and Hydrofera Blue under Kerlix/Coban to the right lateral and medial feet wounds. She still has not heard from vein and vascular for follow-up of arterial study results. 6/24; patient presents for follow-up. We have been using antibiotic ointment and Hydrofera Blue under Kerlix/Coban to the feet wounds. The right lateral foot wound has healed. She has been contacted by vein and vascular however patient does not want to follow-up. 7/16; patient presents for follow-up. We have been using antibiotic ointment with  Hydrofera Blue under Kerlix/Coban. The medial right ankle wound is smaller. Patient History Social History Never smoker, Alcohol Use - Never, Drug Use - No History, Caffeine Use - Never. Medical History Respiratory [name], [name] ([id number]) [id number] Page 5 of 7 Patient has history of Asthma Cardiovascular Patient has history of Hypertension Oncologic Denies history of Received Chemotherapy, Received Radiation Hospitalization/Surgery History - 2023 SBO, inguinal hernia, gangrene, FTT- bowel resection. - tonsillectomy and adenoidectomy. Medical A Surgical History Notes nd Cardiovascular aortic stenosis Gastrointestinal rectal Ca Integumentary (Skin) left lower leg cellulitis Oncologic 2008 rectal Ca Objective Constitutional respirations regular, non-labored and within target range for patient.. Vitals Time Taken: 4:16 PM, Height: 62 in, Weight: 120 lbs, BMI: 21.9, Temperature: 98.1 F, Pulse: 76 bpm, Respiratory Rate: 18 breaths/min, Blood Pressure: 162/75 mmHg. Cardiovascular 2+ dorsalis pedis/posterior tibialis pulses. Psychiatric pleasant and cooperative. General Notes: Small open wound to the right medial malleolus with granulation tissue and scant nonviable tissue. No signs of infection. Significant  venous stasis dermatitis and varicosities throughout the leg. Good edema control. Integumentary (Hair, Skin) Wound #2 status is Open. Original cause of wound was Gradually Appeared. The date acquired was: 06/08/2022. The wound has been in treatment 10 weeks. The wound is located on the Right,Medial Ankle. The wound measures 0.3cm length x 0.4cm width x 0.1cm depth; 0.094cm^2 area and 0.009cm^3 volume. There is Fat Layer (Subcutaneous Tissue) exposed. There is no tunneling or undermining noted. There is a medium amount of serosanguineous drainage noted. There is large (67-100%) granulation within the wound bed. There is a small (1-33%) amount of necrotic tissue within the wound bed including Adherent Slough. The periwound skin appearance did not exhibit: Callus, Crepitus, Excoriation, Induration, Rash, Scarring, Dry/Scaly, Maceration, Atrophie Blanche, Cyanosis, Ecchymosis, Hemosiderin Staining, Mottled, Pallor, Rubor, Erythema. 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 I recommended continue the course with antibiotic ointment and Hydrofera Blue under Kerlix/Coban. She is scheduled to see vein and vascular next month to assess blood flow status. Follow-up in 1 week. Plan Follow-up Appointments: Return Appointment in 1 week. - Dr. Mikey Bussing room 7 330pm 01/28/2023 Monday Return Appointment in 2 weeks. - Dr. Mikey Bussing 345pm Monday 02/04/2023 Room 9 Other: - Vein and Vascular appt 02/13/2023 ensure you go. Anesthetic: (In clinic) Topical Lidocaine 4% applied to wound bed Bathing/ Shower/ Hygiene: May shower with protection but do not get wound dressing(s) wet.  Protect dressing(s) with water repellant cover (for example, large plastic bag) or a cast cover and may then take shower. Edema Control - Lymphedema / SCD / OtherSherre Faulkner ([id number]) [id number] Page 6 of 7 Elevate legs to the level of the heart or above for 30 minutes daily and/or when sitting for 3-4 times a day throughout the day. Avoid standing for long periods of time. Exercise regularly WOUND #2: - Ankle Wound Laterality: Right, Medial Cleanser: Soap and Water 1 x Per Week/30 Days Discharge Instructions: May shower and wash wound with dial antibacterial soap and water prior to dressing change. Cleanser: Vashe 5.8 (oz) 1 x Per Week/30 Days Discharge Instructions: Cleanse the wound with Vashe prior to applying a clean dressing using gauze sponges, not tissue or cotton balls. Peri-Wound Care: Triamcinolone 15 (g) 1 x Per Week/30 Days Discharge Instructions:  Use triamcinolone 15 (g) as directed Peri-Wound Care: Sween Lotion (Moisturizing lotion) 1 x Per Week/30 Days Discharge Instructions: Apply moisturizing lotion as directed Topical: Gentamicin 1 x Per Week/30 Days Discharge Instructions: As directed by physician Topical: Mupirocin Ointment 1 x Per Week/30 Days Discharge Instructions: Apply Mupirocin (Bactroban) as instructed Prim Dressing: Hydrofera Blue Ready Transfer Foam, 8x8 (in/in) 1 x Per Week/30 Days ary Discharge Instructions: Apply to wound bed as instructed Secondary Dressing: ABD Pad, 8x10 1 x Per Week/30 Days Discharge Instructions: Apply over primary dressing as directed. Com pression Wrap: Kerlix Roll 4.5x3.1 (in/yd) 1 x Per Week/30 Days Discharge Instructions: Apply Kerlix and Coban compression as directed. Com pression Wrap: Coban Self-Adherent Wrap 4x5 (in/yd) 1 x Per Week/30 Days Discharge Instructions: Apply over Kerlix as directed. 1. Antibiotic ointment with Hydrofera Blue under Kerlix/Cobanright lower extremity 2. Follow-up  in 1 week Electronic Signature(s) Signed: 01/22/2023 4:51:26 PM By: Geralyn Corwin DO Entered By: Geralyn Corwin on 01/22/2023 16:46:45 -------------------------------------------------------------------------------- HxROS Details Patient Name: Date of Service: [name], [name] 01/22/2023 3:45 PM Medical Record Number: [id number] Patient Account Number: [id number] Date of Birth/Sex: Treating RN: [birth year] (88 y.o. F) Primary Care Provider: Ardean Larsen Other Clinician: Referring Provider: Treating Provider/Extender: [name], [name] in Treatment: 10 Respiratory Medical History: Positive for: Asthma Cardiovascular Medical History: Positive for: Hypertension Past Medical History Notes: aortic stenosis Gastrointestinal Medical History: Past Medical History Notes: rectal Ca Integumentary (Skin) Medical History: Past Medical History Notes: left lower leg cellulitis Oncologic Medical History: Negative for: Received Chemotherapy; Received Radiation Past Medical History Notes: [address] [name], [name] ([id number]) [id number] Page 7 of 7 Immunizations Pneumococcal Vaccine: Received Pneumococcal Vaccination: No Implantable Devices No devices added Hospitalization / Surgery History Type of Hospitalization/Surgery 2023 SBO, inguinal hernia, gangrene, FTT- bowel resection tonsillectomy and adenoidectomy Family and Social History Never smoker; Alcohol Use: Never; Drug Use: No History; Caffeine Use: Never; Financial Concerns: No; Food, Clothing or Shelter Needs: No; Support System Lacking: No; Transportation Concerns: No Electronic Signature(s) Signed: 01/22/2023 4:51:26 PM By: Geralyn Corwin DO Entered By: Geralyn Corwin on 01/22/2023 16:45:28 -------------------------------------------------------------------------------- SuperBill Details Patient Name: Date of Service: [name], [name] 01/22/2023 Medical Record  Number: [id number] Patient Account Number: [id number] Date of Birth/Sex: Treating RN: [birth year] (89 y.o. 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 I10 Essential (primary) hypertension Facility Procedures : CPT4 Code: [id number] Description: 99214 - WOUND CARE VISIT-LEV 4 EST PT Modifier: Quantity: 1 Physician Procedures : CPT4 Code Description Modifier [id number] - WC PHYS LEVEL 3 - EST PT ICD-10 Diagnosis Description 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

## 2023-01-23 NOTE — Progress Notes (Signed)
[name], [name] ([id number]) [id number] Page 1 of 5 Visit Report for 01/09/2023 Arrival Information Details Patient Name: Date of ServiceTorrie Faulkner RNA 01/09/2023 3:45 PM Medical Record Number: [id number] Patient Account Number: [id number] Date of Birth/Sex: Treating RN: October 21, 1923 (89 y.o. [name] Primary Care [name]: [name] Other Clinician: Referring [name]: Treating [name]/Extender: [name], [name] in Treatment: 8 Visit Information History Since Last Visit Added or deleted any medications: No Patient Arrived: Ambulatory Any new allergies or adverse reactions: No Arrival Time: 16:07 Had a fall or experienced change in No Accompanied By: daughter activities of daily living that may affect Transfer Assistance: None risk of falls: Patient Identification Verified: Yes Signs or symptoms of abuse/neglect since last visito No Secondary Verification Process Completed: Yes Hospitalized since last visit: No Patient Requires Transmission-Based Precautions: No Implantable device outside of the clinic excluding No Patient Has Alerts: Yes cellular tissue based products placed in the center Patient Alerts: ABI R 0.96 (11/29/22) since last visit: ABI L 1.25 (11/29/22) Has Dressing in Place as Prescribed: Yes Has Compression in Place as Prescribed: Yes Pain Present Now: No Electronic Signature(s) Signed: 01/09/2023 4:52:44 PM By: [name] Entered By: [name] on 01/09/2023 16:07:37 -------------------------------------------------------------------------------- Clinic Level of Care Assessment Details Patient Name: Date of ServiceTorrie Faulkner RNA 01/09/2023 3:45 PM Medical Record Number: [id number] Patient Account Number: [id number] Date of Birth/Sex: Treating RN: 02/13/1924 (89 y.o. [name] Primary Care [name]: [name] Other Clinician: [name] Referring [name]: Treating  [name]/Extender: [name], [name] in Treatment: 8 Clinic Level of Care Assessment Items TOOL 4 Quantity Score X- 1 0 Use when only an EandM is performed on FOLLOW-UP visit ASSESSMENTS - Nursing Assessment / Reassessment X- 1 10 Reassessment of Co-morbidities (includes updates in patient status) X- 1 5 Reassessment of Adherence to Treatment Plan ASSESSMENTS - Wound and Skin A ssessment / Reassessment X - Simple Wound Assessment / Reassessment - one wound 1 5 []  - 0 Complex Wound Assessment / Reassessment - multiple wounds []  - 0 Dermatologic / Skin Assessment (not related to wound area) ASSESSMENTS - Focused Assessment []  - 0 Circumferential Edema Measurements - multi extremities []  - 0 Nutritional Assessment / Counseling / Intervention [name] ([id number]) [id number] Page 2 of 5 []  - 0 Lower Extremity Assessment (monofilament, tuning fork, pulses) []  - 0 Peripheral Arterial Disease Assessment (using hand held doppler) ASSESSMENTS - Ostomy and/or Continence Assessment and Care []  - 0 Incontinence Assessment and Management []  - 0 Ostomy Care Assessment and Management (repouching, etc.) PROCESS - Coordination of Care X - Simple Patient / Family Education for ongoing care 1 15 []  - 0 Complex (extensive) Patient / Family Education for ongoing care []  - 0 Staff obtains Chiropractor, Records, T Results / Process Orders est []  - 0 Staff telephones HHA, Nursing Homes / Clarify orders / etc []  - 0 Routine Transfer to another Facility (non-emergent condition) []  - 0 Routine Hospital Admission (non-emergent condition) []  - 0 New Admissions / Manufacturing engineer / Ordering NPWT Apligraf, etc. , []  - 0 Emergency Hospital Admission (emergent condition) X- 1 10 Simple Discharge Coordination []  - 0 Complex (extensive) Discharge Coordination PROCESS - Special Needs []  - 0 Pediatric / Minor Patient Management []  -  0 Isolation Patient Management []  - 0 Hearing / Language / Visual special needs []  - 0 Assessment of Community assistance (transportation, D/C planning, etc.) []  - 0 Additional assistance / Altered mentation []  - 0 Support  Surface(s) Assessment (bed, cushion, seat, etc.) INTERVENTIONS - Wound Cleansing / Measurement X - Simple Wound Cleansing - one wound 1 5 []  - 0 Complex Wound Cleansing - multiple wounds []  - 0 Wound Imaging (photographs - any number of wounds) []  - 0 Wound Tracing (instead of photographs) []  - 0 Simple Wound Measurement - one wound []  - 0 Complex Wound Measurement - multiple wounds INTERVENTIONS - Wound Dressings X - Small Wound Dressing one or multiple wounds 1 10 []  - 0 Medium Wound Dressing one or multiple wounds []  - 0 Large Wound Dressing one or multiple wounds []  - 0 Application of Medications - topical []  - 0 Application of Medications - injection INTERVENTIONS - Miscellaneous []  - 0 External ear exam []  - 0 Specimen Collection (cultures, biopsies, blood, body fluids, etc.) []  - 0 Specimen(s) / Culture(s) sent or taken to Lab for analysis []  - 0 Patient Transfer (multiple staff / Nurse, adult / Similar devices) []  - 0 Simple Staple / Suture removal (25 or less) []  - 0 Complex Staple / Suture removal (26 or more) []  - 0 Hypo / Hyperglycemic Management (close monitor of Blood Glucose) [name] ([id number]) [id number] Page 3 of 5 []  - 0 Ankle / Brachial Index (ABI) - do not check if billed separately []  - 0 Vital Signs Has the patient been seen at the hospital within the last three years: Yes Total Score: 60 Level Of Care: New/Established - Level 2 Electronic Signature(s) Signed: 01/09/2023 5:25:37 PM By: Julie Schwalbe RN Previous Signature: 01/09/2023 4:52:44 PM Version By: [name] Entered By: [name] on 01/09/2023  17:06:57 -------------------------------------------------------------------------------- Encounter Discharge Information Details Patient Name: Date of Service: [name], LO RNA 01/09/2023 3:45 PM Medical Record Number: [id number] Patient Account Number: [id number] Date of Birth/Sex: Treating RN: Apr 22, 1924 (89 y.o. [name] Primary Care [name]: [name] Other Clinician: [name] Referring [name]: Treating [name]/Extender: [name], [name] in Treatment: 8 Encounter Discharge Information Items Discharge Condition: Stable Ambulatory Status: Ambulatory Discharge Destination: Home Transportation: Private Auto Accompanied By: daughter Schedule Follow-up Appointment: Yes Clinical Summary of Care: Electronic Signature(s) Signed: 01/09/2023 4:52:44 PM By: [name] Entered By: [name] on 01/09/2023 16:52:09 -------------------------------------------------------------------------------- Patient/Caregiver Education Details Patient Name: Date of Service: [name], [name] RNA 7/3/2024andnbsp3:45 PM Medical Record Number: [id number] Patient Account Number: [id number] Date of Birth/Gender: Treating RN: 06-26-1924 (89 y.o. [name] Primary Care Physician: [name] Other Clinician: [name] Referring Physician: Treating Physician/Extender: [name] in Treatment: 8 Education Assessment Education Provided To: Patient Education Topics Provided Wound/Skin Impairment: Methods: Explain/Verbal Responses: Return demonstration correctly, State content correctly Electronic Signature(s) Signed: 01/09/2023 5:25:37 PM By: Julie Schwalbe RN Prouse,Signed: 01/09/2023 5:25:37 PM By: Julie Schwalbe RN [name] ([id number]) [id number] Page 4 of 5 Previous Signature: 01/09/2023 4:52:44 PM Version By: [name] Entered By: [name] on 01/09/2023  17:07:45 -------------------------------------------------------------------------------- Wound Assessment Details Patient Name: Date of ServiceTorrie Faulkner RNA 01/09/2023 3:45 PM Medical Record Number: [id number] Patient Account Number: [id number] Date of Birth/Sex: Treating RN: [birth year] (89 y.o. [name] Primary Care Ikran Patman: [name] Other Clinician: Referring Shermaine Brigham: Treating Imanie Darrow/Extender: [name], [name] in Treatment: 8 Wound Status Wound Number: 2 Primary Etiology: Venous Leg Ulcer Wound Location: Right, Medial Ankle Wound Status: Open Wounding Event: Gradually Appeared Date Acquired: 06/08/2022 Faulkner Of Treatment: 8 Clustered Wound: No Wound Measurements Length: (cm) 1 Width: (cm) 1 Depth: (cm) 0.1 Area: (cm) 0.785 Volume: (cm)  0.079 % Reduction in Area: -523% % Reduction in Volume: -507.7% Wound Description Classification: Full Thickness Without Exposed Suppo Exudate Amount: Medium Exudate Type: Serosanguineous Exudate Color: red, brown rt Structures Periwound Skin Texture Texture Color No Abnormalities Noted: No No Abnormalities Noted: No Moisture No Abnormalities Noted: No Electronic Signature(s) Signed: 01/09/2023 4:52:44 PM By: [name] Signed: 01/23/2023 7:59:22 AM By: [name] Entered By: [name] on 01/09/2023 16:07:55 -------------------------------------------------------------------------------- Vitals Details Patient Name: Date of Service: [name], LO RNA 01/09/2023 3:45 PM Medical Record Number: [id number] Patient Account Number: [id number] Date of Birth/Sex: Treating RN: August 20, 1923 (89 y.o. [name] Primary Care Kyian Obst: [name] Other Clinician: Referring Derrico Zhong: Treating Lael Wetherbee/Extender: [name], [name] in Treatment: 8 Vital Signs Time Taken: 16:07 Reference Range: 80 - 120 mg / dl Height (in): 62 Weight (lbs): 120 [place], [name]  ([id number]) [phone].pdf Page 5 of 5 Body Mass Index (BMI): 21.9 Electronic Signature(s) Signed: 01/09/2023 4:52:44 PM By: [name] Entered By: [name] on 01/09/2023 16:07:44

## 2023-01-25 NOTE — Progress Notes (Signed)
[place], [name] ([id number]) [id number] Page 1 of 9 Visit Report for 01/22/2023 Arrival Information Details Patient Name: Date of ServiceCorinna Faulkner, [name] RNA 01/22/2023 3:45 PM Medical Record Number: [id number] Patient Account Number: [id number] Date of Birth/Sex: Treating RN: [birth year] (89 y.o. F) Primary Care Kecia Swoboda: Ardean Larsen Other Clinician: Referring Laela Deviney: Treating Reynalda Canny/Extender: [name], [name] in Treatment: 10 Visit Information History Since Last Visit Added or deleted any medications: No Patient Arrived: Ambulatory Any new allergies or adverse reactions: No Arrival Time: 16:14 Had a fall or experienced change in No Accompanied By: daughter activities of daily living that may affect Transfer Assistance: None risk of falls: Patient Identification Verified: Yes Signs or symptoms of abuse/neglect since last visito No Secondary Verification Process Completed: Yes Hospitalized since last visit: No Patient Requires Transmission-Based Precautions: No Implantable device outside of the clinic excluding No Patient Has Alerts: Yes cellular tissue based products placed in the center Patient Alerts: ABI R 0.96 (11/29/22) since last visit: ABI L 1.25 (11/29/22) Has Dressing in Place as Prescribed: Yes Has Compression in Place as Prescribed: Yes Pain Present Now: No Electronic Signature(s) Signed: 01/24/2023 4:50:33 PM By: Thayer Dallas Entered By: Thayer Dallas on 01/22/2023 16:15:04 -------------------------------------------------------------------------------- Clinic Level of Care Assessment Details Patient Name: Date of ServiceTorrie Faulkner RNA 01/22/2023 3:45 PM Medical Record Number: [id number] Patient Account Number: [id number] Date of Birth/Sex: Treating RN: 1924-06-19 (89 y.o. [name] Primary Care Ajayla Iglesias: Ardean Larsen Other Clinician: Referring Tashe Purdon: Treating Simonne Boulos/Extender: [name], [name] in Treatment: 10 Clinic Level of Care Assessment Items TOOL 4 Quantity Score X- 1 0 Use when only an EandM is performed on FOLLOW-UP visit ASSESSMENTS - Nursing Assessment / Reassessment X- 1 10 Reassessment of Co-morbidities (includes updates in patient status) X- 1 5 Reassessment of Adherence to Treatment Plan ASSESSMENTS - Wound and Skin A ssessment / Reassessment X - Simple Wound Assessment / Reassessment - one wound 1 5 []  - 0 Complex Wound Assessment / Reassessment - multiple wounds []  - 0 Dermatologic / Skin Assessment (not related to wound area) ASSESSMENTS - Focused Assessment X- 1 5 Circumferential Edema Measurements - multi extremities X- 1 10 Nutritional Assessment / Counseling / Intervention [name] ([id number]) [id number] Page 2 of 9 []  - 0 Lower Extremity Assessment (monofilament, tuning fork, pulses) []  - 0 Peripheral Arterial Disease Assessment (using hand held doppler) ASSESSMENTS - Ostomy and/or Continence Assessment and Care []  - 0 Incontinence Assessment and Management []  - 0 Ostomy Care Assessment and Management (repouching, etc.) PROCESS - Coordination of Care X - Simple Patient / Family Education for ongoing care 1 15 []  - 0 Complex (extensive) Patient / Family Education for ongoing care X- 1 10 Staff obtains Chiropractor, Records, T Results / Process Orders est X- 1 10 Staff telephones HHA, Nursing Homes / Clarify orders / etc []  - 0 Routine Transfer to another Facility (non-emergent condition) []  - 0 Routine Hospital Admission (non-emergent condition) []  - 0 New Admissions / Manufacturing engineer / Ordering NPWT Apligraf, etc. , []  - 0 Emergency Hospital Admission (emergent condition) X- 1 10 Simple Discharge Coordination []  - 0 Complex (extensive) Discharge Coordination PROCESS - Special Needs []  - 0 Pediatric / Minor Patient Management []  - 0 Isolation Patient  Management []  - 0 Hearing / Language / Visual special needs []  - 0 Assessment of Community assistance (transportation, D/C planning, etc.) []  - 0 Additional assistance / Altered mentation []  - 0 Support Surface(s) Assessment (bed, cushion, seat, etc.)  INTERVENTIONS - Wound Cleansing / Measurement X - Simple Wound Cleansing - one wound 1 5 []  - 0 Complex Wound Cleansing - multiple wounds X- 1 5 Wound Imaging (photographs - any number of wounds) []  - 0 Wound Tracing (instead of photographs) X- 1 5 Simple Wound Measurement - one wound []  - 0 Complex Wound Measurement - multiple wounds INTERVENTIONS - Wound Dressings []  - 0 Small Wound Dressing one or multiple wounds []  - 0 Medium Wound Dressing one or multiple wounds X- 1 20 Large Wound Dressing one or multiple wounds []  - 0 Application of Medications - topical []  - 0 Application of Medications - injection INTERVENTIONS - Miscellaneous []  - 0 External ear exam []  - 0 Specimen Collection (cultures, biopsies, blood, body fluids, etc.) []  - 0 Specimen(s) / Culture(s) sent or taken to Lab for analysis []  - 0 Patient Transfer (multiple staff / Nurse, adult / Similar devices) []  - 0 Simple Staple / Suture removal (25 or less) []  - 0 Complex Staple / Suture removal (26 or more) []  - 0 Hypo / Hyperglycemic Management (close monitor of Blood Glucose) [name] ([id number]) [id number] Page 3 of 9 []  - 0 Ankle / Brachial Index (ABI) - do not check if billed separately X- 1 5 Vital Signs Has the patient been seen at the hospital within the last three years: Yes Total Score: 120 Level Of Care: New/Established - Level 4 Electronic Signature(s) Signed: 01/22/2023 6:16:33 PM By: Shawn Stall RN, BSN Entered By: Shawn Stall on 01/22/2023 16:38:06 -------------------------------------------------------------------------------- Encounter Discharge Information Details Patient Name: Date of  Service: [name], [place] RNA 01/22/2023 3:45 PM Medical Record Number: [id number] Patient Account Number: [id number] Date of Birth/Sex: Treating RN: 1924/06/11 (89 y.o. [name] Primary Care Clementine Soulliere: Ardean Larsen Other Clinician: Referring Scottie Metayer: Treating Alveta Quintela/Extender: [name]: 10 Encounter Discharge Information Items Discharge Condition: Stable Ambulatory Status: Ambulatory Discharge Destination: Home Transportation: Private Auto Accompanied By: daughter Schedule Follow-up Appointment: Yes Clinical Summary of Care: Electronic Signature(s) Signed: 01/22/2023 6:16:33 PM By: Shawn Stall RN, BSN Entered By: Shawn Stall on 01/22/2023 16:38:33 -------------------------------------------------------------------------------- Lower Extremity Assessment Details Patient Name: Date of Service: [name], [place] RNA 01/22/2023 3:45 PM Medical Record Number: [id number] Patient Account Number: [id number] Date of Birth/Sex: Treating RN: Dec 22, 1923 (89 y.o. F) Primary Care Sabrina Keough: Ardean Larsen Other Clinician: Referring Zillah Alexie: Treating Natalyah Cummiskey/Extender: [name], [name] in Treatment: 10 Edema Assessment Assessed: [Left: No] [Right: No] Edema: [Left: Ye] [Right: s] Calf Left: Right: Point of Measurement: 31 cm From Medial Instep 29 cm Ankle Left: Right: Point of Measurement: 10 cm From Medial Instep 18.5 cm Electronic Signature(s) [place], [name] ([id number]) [id number] Page 4 of 9 Signed: 01/24/2023 4:50:33 PM By: Thayer Dallas Entered By: Thayer Dallas on 01/22/2023 16:22:31 -------------------------------------------------------------------------------- Multi Wound Chart Details Patient Name: Date of Service: [name], [place] RNA 01/22/2023 3:45 PM Medical Record Number: [id number] Patient Account Number: [id number] Date of Birth/Sex: Treating RN: 1923/09/08 (89 y.o. F) Primary  Care Kaedyn Polivka: Ardean Larsen Other Clinician: Referring Cariana Karge: Treating Susannah Carbin/Extender: [name], [name] in Treatment: 10 Vital Signs Height(in): 62 Pulse(bpm): 76 Weight(lbs): 120 Blood Pressure(mmHg): 162/75 Body Mass Index(BMI): 21.9 Temperature(F): 98.1 Respiratory Rate(breaths/min): 18 [2:Photos:] [N/A:N/A] Right, Medial Ankle N/A N/A Wound Location: Gradually Appeared N/A N/A Wounding Event: Venous Leg Ulcer N/A N/A Primary Etiology: Asthma, Hypertension N/A N/A Comorbid History: 06/08/2022 N/A N/A Date Acquired: 10 N/A N/A Weeks of Treatment: Open N/A N/A Wound Status: No N/A N/A Wound  Recurrence: 0.3x0.4x0.1 N/A N/A Measurements L x W x D (cm) 0.094 N/A N/A A (cm) : rea 0.009 N/A N/A Volume (cm) : 25.40% N/A N/A % Reduction in A rea: 30.80% N/A N/A % Reduction in Volume: Full Thickness Without Exposed N/A N/A Classification: Support Structures Medium N/A N/A Exudate Amount: Serosanguineous N/A N/A Exudate Type: red, brown N/A N/A Exudate Color: Large (67-100%) N/A N/A Granulation Amount: Small (1-33%) N/A N/A Necrotic Amount: Fat Layer (Subcutaneous Tissue): Yes N/A N/A Exposed Structures: Fascia: No Tendon: No Muscle: No Joint: No Bone: No Small (1-33%) N/A N/A Epithelialization: Excoriation: No N/A N/A Periwound Skin Texture: Induration: No Callus: No Crepitus: No Rash: No Scarring: No Maceration: No N/A N/A Periwound Skin Moisture: Dry/Scaly: No Atrophie Blanche: No N/A N/A Periwound Skin Color: Cyanosis: No Ecchymosis: No Erythema: No Hemosiderin Staining: No Mottled: No Pallor: No Rubor: No [name] ([id number]) [id number] Page 5 of 9 Treatment Notes Wound #2 (Ankle) Wound Laterality: Right, Medial Cleanser Soap and Water Discharge Instruction: May shower and wash wound with dial antibacterial soap and water prior to dressing change. Vashe 5.8 (oz) Discharge  Instruction: Cleanse the wound with Vashe prior to applying a clean dressing using gauze sponges, not tissue or cotton balls. Peri-Wound Care Triamcinolone 15 (g) Discharge Instruction: Use triamcinolone 15 (g) as directed Sween Lotion (Moisturizing lotion) Discharge Instruction: Apply moisturizing lotion as directed Topical Gentamicin Discharge Instruction: As directed by physician Mupirocin Ointment Discharge Instruction: Apply Mupirocin (Bactroban) as instructed Primary Dressing Hydrofera Blue Ready Transfer Foam, 8x8 (in/in) Discharge Instruction: Apply to wound bed as instructed Secondary Dressing ABD Pad, 8x10 Discharge Instruction: Apply over primary dressing as directed. Secured With Compression Wrap Kerlix Roll 4.5x3.1 (in/yd) Discharge Instruction: Apply Kerlix and Coban compression as directed. Coban Self-Adherent Wrap 4x5 (in/yd) Discharge Instruction: Apply over Kerlix as directed. Compression Stockings Add-Ons Electronic Signature(s) Signed: 01/22/2023 4:51:26 PM By: Geralyn Corwin DO Entered By: Geralyn Corwin on 01/22/2023 16:44:16 -------------------------------------------------------------------------------- Multi-Disciplinary Care Plan Details Patient Name: Date of Service: [name], [place] RNA 01/22/2023 3:45 PM Medical Record Number: [id number] Patient Account Number: [id number] Date of Birth/Sex: Treating RN: 06/11/1924 (89 y.o. [name] Primary Care Deona Novitski: Ardean Larsen Other Clinician: Referring Kasara Schomer: Treating Angelyn Osterberg/Extender: [name], [name] in Treatment: 10 Active Inactive Pain, Acute or Chronic Nursing Diagnoses: Pain, acute or chronic: actual or potential CHARLISHA, MARKET ([id number]) [id number] Page 6 of 9 Potential alteration in comfort, pain Goals: Patient will verbalize adequate pain control and receive pain control interventions during procedures as needed Date Initiated:  11/13/2022 Date Inactivated: 12/18/2022 Target Resolution Date: 02/06/2023 Goal Status: Met Patient/caregiver will verbalize comfort level met Date Initiated: 11/13/2022 Target Resolution Date: 02/06/2023 Goal Status: Active Interventions: Assess comfort goal upon admission Encourage patient to take pain medications as prescribed Treatment Activities: Administer pain control measures as ordered : 11/13/2022 Notes: Venous Leg Ulcer Nursing Diagnoses: Knowledge deficit related to disease process and management Goals: Patient will maintain optimal edema control Date Initiated: 11/13/2022 Target Resolution Date: 02/06/2023 Goal Status: Active Interventions: Assess peripheral edema status every visit. Compression as ordered Provide education on venous insufficiency Notes: Wound/Skin Impairment Nursing Diagnoses: Knowledge deficit related to ulceration/compromised skin integrity Goals: Patient/caregiver will verbalize understanding of skin care regimen Date Initiated: 11/13/2022 Target Resolution Date: 02/06/2023 Goal Status: Active Interventions: Assess patient/caregiver ability to perform ulcer/skin care regimen upon admission and as needed Assess ulceration(s) every visit Provide education on ulcer and skin care Treatment Activities: Skin care regimen initiated : 11/13/2022 Topical wound management initiated :  11/13/2022 Notes: Electronic Signature(s) Signed: 01/22/2023 6:16:33 PM By: Shawn Stall RN, BSN Entered By: Shawn Stall on 01/22/2023 16:34:03 -------------------------------------------------------------------------------- Pain Assessment Details Patient Name: Date of Service: [name], [place] RNA 01/22/2023 3:45 PM Medical Record Number: [id number] Patient Account Number: [id number] Date of Birth/Sex: Treating RN: 1924/05/02 (89 y.o. F) Primary Care [place]: Ardean Larsen Other Clinician: Referring Aveline Daus: Treating Blaire Hodsdon/Extender: [name], [name] in  Treatment: [address] [name] ([id number]) [id number] Page 7 of 9 Active Problems Location of Pain Severity and Description of Pain Patient Has Paino No Site Locations Pain Management and Medication Current Pain Management: Electronic Signature(s) Signed: 01/24/2023 4:50:33 PM By: Thayer Dallas Entered By: Thayer Dallas on 01/22/2023 16:15:15 -------------------------------------------------------------------------------- Patient/Caregiver Education Details Patient Name: Date of Service: [name], Marton [place] RNA 7/16/2024andnbsp3:45 PM Medical Record Number: [id number] Patient Account Number: [id number] Date of Birth/Gender: Treating RN: February 26, 1924 (89 y.o. [name] Primary Care Physician: Ardean Larsen Other Clinician: Referring Physician: Treating Physician/Extender: [name]: 10 Education Assessment Education Provided To: Patient Education Topics Provided Wound/Skin Impairment: Handouts: Caring for Your Ulcer Methods: Explain/Verbal Responses: Reinforcements needed Electronic Signature(s) Signed: 01/22/2023 6:16:33 PM By: Shawn Stall RN, BSN Entered By: Shawn Stall on 01/22/2023 16:34:14 [name] ([id number]) [id number] Page 8 of 9 -------------------------------------------------------------------------------- Wound Assessment Details Patient Name: Date of ServiceTorrie Faulkner RNA 01/22/2023 3:45 PM Medical Record Number: [id number] Patient Account Number: [id number] Date of Birth/Sex: Treating RN: 1924-03-16 (89 y.o. F) Primary Care Alisah Grandberry: Ardean Larsen Other Clinician: Referring Matia Zelada: Treating Dalani Mette/Extender: [name], [name] in Treatment: 10 Wound Status Wound Number: 2 Primary Etiology: Venous Leg Ulcer Wound Location: Right, Medial Ankle Wound Status: Open Wounding Event: Gradually Appeared Comorbid History: Asthma,  Hypertension Date Acquired: 06/08/2022 Weeks Of Treatment: 10 Clustered Wound: No Photos Wound Measurements Length: (cm) Width: (cm) Depth: (cm) Area: (cm) Volume: (cm) 0.3 % Reduction in Area: 25.4% 0.4 % Reduction in Volume: 30.8% 0.1 Epithelialization: Small (1-33%) 0.094 Tunneling: No 0.009 Undermining: No Wound Description Classification: Full Thickness Without Exposed Sup Exudate Amount: Medium Exudate Type: Serosanguineous Exudate Color: red, brown port Structures Wound Bed Granulation Amount: Large (67-100%) Exposed Structure Necrotic Amount: Small (1-33%) Fascia Exposed: No Necrotic Quality: Adherent Slough Fat Layer (Subcutaneous Tissue) Exposed: Yes Tendon Exposed: No Muscle Exposed: No Joint Exposed: No Bone Exposed: No Periwound Skin Texture Texture Color No Abnormalities Noted: No No Abnormalities Noted: No Callus: No Atrophie Blanche: No Crepitus: No Cyanosis: No Excoriation: No Ecchymosis: No Induration: No Erythema: No Rash: No Hemosiderin Staining: No Scarring: No Mottled: No Pallor: No Moisture Rubor: No No Abnormalities Noted: No Dry / Scaly: No Maceration: No Treatment Notes Wound #2 (Ankle) Wound Laterality: Right, [address] [place], [name] ([id number]) [id number] Page 9 of 9 Discharge Instruction: May shower and wash wound with dial antibacterial soap and water prior to dressing change. Vashe 5.8 (oz) Discharge Instruction: Cleanse the wound with Vashe prior to applying a clean dressing using gauze sponges, not tissue or cotton balls. Peri-Wound Care Triamcinolone 15 (g) Discharge Instruction: Use triamcinolone 15 (g) as directed Sween Lotion (Moisturizing lotion) Discharge Instruction: Apply moisturizing lotion as directed Topical Gentamicin Discharge Instruction: As directed by physician Mupirocin Ointment Discharge Instruction: Apply Mupirocin (Bactroban) as  instructed Primary Dressing Hydrofera Blue Ready Transfer Foam, 8x8 (in/in) Discharge Instruction: Apply to wound bed as instructed Secondary Dressing ABD Pad, 8x10 Discharge Instruction: Apply over primary dressing as directed. Secured With Compression Wrap Kerlix Roll 4.5x3.1 (in/yd) Discharge Instruction: Apply  Kerlix and Coban compression as directed. Coban Self-Adherent Wrap 4x5 (in/yd) Discharge Instruction: Apply over Kerlix as directed. Compression Stockings Add-Ons Electronic Signature(s) Signed: 01/24/2023 4:50:33 PM By: Thayer Dallas Entered By: Thayer Dallas on 01/22/2023 16:23:53 -------------------------------------------------------------------------------- Vitals Details Patient Name: Date of Service: [name], [place] RNA 01/22/2023 3:45 PM Medical Record Number: [id number] Patient Account Number: [id number] Date of Birth/Sex: Treating RN: [birth year] (89 y.o. F) Primary Care Gillermo Poch: Ardean Larsen Other Clinician: Referring Arnice Vanepps: Treating [place]/Extender: [name], [name] in Treatment: 10 Vital Signs Time Taken: 16:16 Temperature (F): 98.1 Height (in): 62 Pulse (bpm): 76 Weight (lbs): 120 Respiratory Rate (breaths/min): 18 Body Mass Index (BMI): 21.9 Blood Pressure (mmHg): 162/75 Reference Range: 80 - 120 mg / dl Electronic Signature(s) Signed: 01/24/2023 4:50:33 PM By: Thayer Dallas Entered By: Thayer Dallas on 01/22/2023 16:30:28

## 2023-01-28 ENCOUNTER — Encounter (HOSPITAL_BASED_OUTPATIENT_CLINIC_OR_DEPARTMENT_OTHER): Payer: 59 | Admitting: Internal Medicine

## 2023-01-28 DIAGNOSIS — L97311 Non-pressure chronic ulcer of right ankle limited to breakdown of skin: Secondary | ICD-10-CM | POA: Diagnosis not present

## 2023-01-28 DIAGNOSIS — I10 Essential (primary) hypertension: Secondary | ICD-10-CM | POA: Diagnosis not present

## 2023-01-28 DIAGNOSIS — I70233 Atherosclerosis of native arteries of right leg with ulceration of ankle: Secondary | ICD-10-CM

## 2023-01-28 DIAGNOSIS — I87331 Chronic venous hypertension (idiopathic) with ulcer and inflammation of right lower extremity: Secondary | ICD-10-CM

## 2023-01-28 NOTE — Progress Notes (Signed)
[name] ([id number]) [id number] Page 1 of 7 Visit Report for 01/28/2023 Chief Complaint Document Details Patient Name: Date of Service: [name], [name] RNA 01/28/2023 3:30 PM Medical Record Number: [id number] Patient Account Number: [id number] Date of Birth/Sex: Treating RN: [birth year] (89 y.o. F) Primary Care Provider: Ardean Larsen Other Clinician: Referring Provider: Treating Provider/Extender: [name], [name] in Treatment: 10 Information Obtained from: Patient Chief Complaint 11/13/2022; patient is here for review of wounds on her right lateral and right medial ankle Electronic Signature(s) Signed: 01/28/2023 4:55:26 PM By: Geralyn Corwin DO Entered By: Geralyn Corwin on 01/28/2023 16:32:45 -------------------------------------------------------------------------------- HPI Details Patient Name: Date of Service: [name], [place] RNA 01/28/2023 3:30 PM Medical Record Number: [id number] Patient Account Number: [id number] Date of Birth/Sex: Treating RN: 1923/08/17 (89 y.o. F) Primary Care Provider: Ardean Larsen Other Clinician: Referring Provider: Treating Provider/Extender: [name], [name] in Treatment: 10 History of Present Illness HPI Description: ADMISSION 11/12/2021 This is a 89 year old independent woman who arrives for review of wounds on her lateral and medial ankle. It is a bit difficult to get a precise history here but it is quite clear that these have been present for many months. She states that she normally wore stockings however the wound laterally started to drain too much so she has not been wearing them. She has developed a circular erythematous area around the wounds on the lateral malleolus and her primary care doctor has given her samples of Nuzyra to use for cellulitis. Past medical history is actually very limited to hypertension, hearing loss. She had a rectal resection for cancer in  2008. In 2023 she had a small bowel obstruction. She also says she has a history of cellulitis of both legs ABIs in our clinic were difficult to obtain at 0.61 bilaterally 5/16; patient presents for follow-up. We have been using Hydrofera Blue under Kerlix/Coban to the right lower extremity. She is scheduled to have her ABIs done next week. She has no issues or complaints. She tolerated the wrap well. 5/28; patient presents for follow-up. We have been using Hydrofera Blue under Kerlix/Coban to the right lower extremity wounds. She took the wraps off to have her ABIs and TBI's completed. She has been without the wrap or dressings for the past 5 days. She has irritation to the right lateral leg. Her ABIs were noted to be 0.96 on the right with monophasic waveforms throughout and a TBI of 0.3. 6/4; right medial malleolus and just behind the right lateral malleolus. We have been using topical antibiotics and Hydrofera Blue under kerlix Coban, can compression. There was a referral I think placed to vein and vascular for follow-up of her arterial studies 6/11; patient presents for follow-up. We have been using antibiotic ointment and Hydrofera Blue under Kerlix/Coban to the right lateral and medial feet wounds. She still has not heard from vein and vascular for follow-up of arterial study results. 6/24; patient presents for follow-up. We have been using antibiotic ointment and Hydrofera Blue under Kerlix/Coban to the feet wounds. The right lateral foot wound has healed. She has been contacted by vein and vascular however patient does not want to follow-up. 7/16; patient presents for follow-up. We have been using antibiotic ointment with Hydrofera Blue under Kerlix/Coban. The medial right ankle wound is smaller. [name] ([id number]) [id number] Page 2 of 7 7/22; patient presents for follow-up. We have been using antibiotic ointment with Hydrofera Blue under Kerlix/Coban to  the medial right ankle wound. This wound is smaller. Patient  has no issues or complaints today. Electronic Signature(s) Signed: 01/28/2023 4:55:26 PM By: Geralyn Corwin DO Entered By: Geralyn Corwin on 01/28/2023 16:33:14 -------------------------------------------------------------------------------- Physical Exam Details Patient Name: Date of ServiceTorrie Faulkner RNA 01/28/2023 3:30 PM Medical Record Number: [id number] Patient Account Number: [id number] Date of Birth/Sex: Treating RN: May 10, 1924 (89 y.o. F) Primary Care Provider: Ardean Larsen Other Clinician: Referring Provider: Treating Provider/Extender: [name], [name] in Treatment: 10 Constitutional respirations regular, non-labored and within target range for patient.. Cardiovascular 2+ dorsalis pedis/posterior tibialis pulses. Psychiatric pleasant and cooperative. Notes Small open wound to the right medial malleolus with granulation tissue and scant nonviable tissue. No signs of infection. Significant venous stasis dermatitis and varicosities throughout the leg. Good edema control. Electronic Signature(s) Signed: 01/28/2023 4:55:26 PM By: Geralyn Corwin DO Entered By: Geralyn Corwin on 01/28/2023 16:33:37 -------------------------------------------------------------------------------- Physician Orders Details Patient Name: Date of Service: [name], [place] RNA 01/28/2023 3:30 PM Medical Record Number: [id number] Patient Account Number: [id number] Date of Birth/Sex: Treating RN: 1923/08/18 (89 y.o. [name] Primary Care Provider: Ardean Larsen Other Clinician: Referring Provider: Treating Provider/Extender: [name], [name] in Treatment: 10 Verbal / Phone Orders: No Diagnosis Coding Follow-up Appointments ppointment in 1 week. - Dr. Mikey Bussing 345pm Monday 02/04/2023 Room 9 Return A Other: - Vein and Vascular appt 02/13/2023 ensure you go. Anesthetic (In clinic)  Topical Lidocaine 4% applied to wound bed Bathing/ Shower/ Hygiene May shower with protection but do not get wound dressing(s) wet. Protect dressing(s) with water repellant cover (for example, large plastic bag) or a cast cover and may then take shower. Edema Control - Lymphedema / SCD / Other Elevate legs to the level of the heart or above for 30 minutes daily and/or when sitting for 3-4 times a day throughout the day. Avoid standing for long periods of time. [name] ([id number]) [id number] Page 3 of 7 Exercise regularly Wound Treatment Wound #2 - Ankle Wound Laterality: Right, Medial Cleanser: Soap and Water 1 x Per Week/30 Days Discharge Instructions: May shower and wash wound with dial antibacterial soap and water prior to dressing change. Cleanser: Vashe 5.8 (oz) 1 x Per Week/30 Days Discharge Instructions: Cleanse the wound with Vashe prior to applying a clean dressing using gauze sponges, not tissue or cotton balls. Peri-Wound Care: Triamcinolone 15 (g) 1 x Per Week/30 Days Discharge Instructions: Use triamcinolone 15 (g) as directed Peri-Wound Care: Sween Lotion (Moisturizing lotion) 1 x Per Week/30 Days Discharge Instructions: Apply moisturizing lotion as directed Topical: Gentamicin 1 x Per Week/30 Days Discharge Instructions: As directed by physician Topical: Mupirocin Ointment 1 x Per Week/30 Days Discharge Instructions: Apply Mupirocin (Bactroban) as instructed Prim Dressing: Hydrofera Blue Ready Transfer Foam, 8x8 (in/in) 1 x Per Week/30 Days ary Discharge Instructions: Apply to wound bed as instructed Secondary Dressing: ABD Pad, 8x10 1 x Per Week/30 Days Discharge Instructions: Apply over primary dressing as directed. Compression Wrap: Kerlix Roll 4.5x3.1 (in/yd) 1 x Per Week/30 Days Discharge Instructions: Apply Kerlix and Coban compression as directed. Compression Wrap: Coban Self-Adherent Wrap 4x5 (in/yd) 1 x Per Week/30  Days Discharge Instructions: Apply over Kerlix as directed. Electronic Signature(s) Signed: 01/28/2023 4:55:26 PM By: Geralyn Corwin DO Entered By: Geralyn Corwin on 01/28/2023 16:33:48 -------------------------------------------------------------------------------- Problem List Details Patient Name: Date of Service: [name], [place] RNA 01/28/2023 3:30 PM Medical Record Number: [id number] Patient Account Number: [id number] Date of Birth/Sex: Treating RN: Oct 09, 1923 (89 y.o. 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 11/13/2022 No Yes I10 Essential (primary) hypertension 11/13/2022 No Yes [name] ([id number]) [id number] Page 4 of 7 Inactive Problems Resolved Problems Electronic Signature(s) Signed: 01/28/2023 4:55:26 PM By: Geralyn Corwin DO Entered By: Geralyn Corwin on 01/28/2023 16:32:07 -------------------------------------------------------------------------------- Progress Note Details Patient Name: Date of Service: [name], [place] RNA 01/28/2023 3:30 PM Medical Record Number: [id number] Patient Account Number: [id number] Date of Birth/Sex: Treating RN: December 17, 1923 (89 y.o. F) Primary Care Provider: Ardean Larsen Other Clinician: Referring Provider: Treating Provider/Extender: [name], [name] in Treatment: 10 Subjective Chief Complaint Information obtained from Patient 11/13/2022; patient is here for review of wounds on her  right lateral and right medial ankle History of Present Illness (HPI) ADMISSION 11/12/2021 This is a 89 year old independent woman who arrives for review of wounds on her lateral and medial ankle. It is a bit difficult to get a precise history here but it is quite clear that these have been present for many months. She states that she normally wore stockings however the wound laterally started to drain too much so she has not been wearing them. She has developed a circular erythematous area around the wounds on the lateral malleolus and her primary care doctor has given her samples of Nuzyra to use for cellulitis. Past medical history is actually very limited to hypertension, hearing loss. She had a rectal resection for cancer in 2008. In 2023 she had a small bowel obstruction. She also says she has a history of cellulitis of both legs ABIs in our clinic were difficult to obtain at 0.61 bilaterally 5/16; patient presents for follow-up. We have been using Hydrofera Blue under Kerlix/Coban to the right lower extremity. She is scheduled to have her ABIs done next week. She has no issues or complaints. She tolerated the wrap well. 5/28; patient presents for follow-up. We have been using Hydrofera Blue under Kerlix/Coban to the right lower extremity wounds. She took the wraps off to have her ABIs and TBI's completed. She has been without the wrap or dressings for the past 5 days. She has irritation to the right lateral leg. Her ABIs were noted to be 0.96 on the right with monophasic waveforms throughout and a TBI of 0.3. 6/4; right medial malleolus and just behind the right lateral malleolus. We have been using topical antibiotics and Hydrofera Blue under kerlix Coban, can compression. There was a referral I think placed to vein and vascular for follow-up of her arterial studies 6/11; patient presents for follow-up. We have been using antibiotic ointment and Hydrofera Blue under Kerlix/Coban to the right  lateral and medial feet wounds. She still has not heard from vein and vascular for follow-up of arterial study results. 6/24; patient presents for follow-up. We have been using antibiotic ointment and Hydrofera Blue under Kerlix/Coban to the feet wounds. The right lateral foot wound has healed. She has been contacted by vein and vascular however patient does not want to follow-up. 7/16; patient presents for follow-up. We have been using antibiotic ointment with Hydrofera Blue under Kerlix/Coban. The medial right ankle wound is smaller. 7/22; patient presents for follow-up. We have been using antibiotic ointment with Hydrofera Blue under  Kerlix/Coban to the medial right ankle wound. This wound is smaller. Patient has no issues or complaints today. Patient History Social History Never smoker, Alcohol Use - Never, Drug Use - No History, Caffeine Use - Never. Medical History Respiratory Patient has history of Asthma Cardiovascular Patient has history of Hypertension Oncologic Denies history of Received Chemotherapy, Received Radiation [name], [name] ([id number]) [id number] Page 5 of 7 Hospitalization/Surgery History - 2023 SBO, inguinal hernia, gangrene, FTT- bowel resection. - tonsillectomy and adenoidectomy. Medical A Surgical History Notes nd Cardiovascular aortic stenosis Gastrointestinal rectal Ca Integumentary (Skin) left lower leg cellulitis Oncologic 2008 rectal Ca Objective Constitutional respirations regular, non-labored and within target range for patient.. Vitals Time Taken: 3:51 PM, Height: 62 in, Weight: 120 lbs, BMI: 21.9, Temperature: 98.2 F, Pulse: 81 bpm, Respiratory Rate: 18 breaths/min, Blood Pressure: 175/81 mmHg. Cardiovascular 2+ dorsalis pedis/posterior tibialis pulses. Psychiatric pleasant and cooperative. General Notes: Small open wound to the right medial malleolus with granulation tissue and scant nonviable tissue. No signs of  infection. Significant venous stasis dermatitis and varicosities throughout the leg. Good edema control. Integumentary (Hair, Skin) Wound #2 status is Open. Original cause of wound was Gradually Appeared. The date acquired was: 06/08/2022. The wound has been in treatment 10 weeks. The wound is located on the Right,Medial Ankle. The wound measures 0.3cm length x 0.5cm width x 0.1cm depth; 0.118cm^2 area and 0.012cm^3 volume. There is Fat Layer (Subcutaneous Tissue) exposed. There is no tunneling or undermining noted. There is a medium amount of serosanguineous drainage noted. There is small (1-33%) granulation within the wound bed. There is a large (67-100%) amount of necrotic tissue within the wound bed including Adherent Slough. The periwound skin appearance did not exhibit: Callus, Crepitus, Excoriation, Induration, Rash, Scarring, Dry/Scaly, Maceration, Atrophie Blanche, Cyanosis, Ecchymosis, Hemosiderin Staining, Mottled, Pallor, Rubor, Erythema. 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 I recommended continuing with Hydrofera Blue and stopping the antibiotic ointment and continuing the Kerlix/Coban. Follow-up in 1 week. Plan Follow-up Appointments: Return Appointment in 1 week. - Dr. Mikey Bussing 345pm Monday 02/04/2023 Room 9 Other: - Vein and Vascular appt 02/13/2023 ensure you go. Anesthetic: (In clinic) Topical Lidocaine 4% applied to wound bed Bathing/ Shower/ Hygiene: May shower with protection but do not get wound dressing(s) wet. Protect dressing(s) with water repellant cover (for example, large plastic bag) or a cast cover and may then take shower. Edema Control - Lymphedema / SCD /  Other: Elevate legs to the level of the heart or above for 30 minutes daily and/or when sitting for 3-4 times a day throughout the day. Avoid standing for long periods of time. Exercise regularly WOUND #2: - Ankle Wound Laterality: Right, Medial Cleanser: Soap and Water 1 x Per Week/30 Days Discharge Instructions: May shower and wash wound with dial antibacterial soap and water prior to dressing change. [name] ([id number]) [id number] Page 6 of 7 Cleanser: Vashe 5.8 (oz) 1 x Per Week/30 Days Discharge Instructions: Cleanse the wound with Vashe prior to applying a clean dressing using gauze sponges, not tissue or cotton balls. Peri-Wound Care: Triamcinolone 15 (g) 1 x Per Week/30 Days Discharge Instructions: Use triamcinolone 15 (g) as directed Peri-Wound Care: Sween Lotion (Moisturizing lotion) 1 x Per Week/30 Days Discharge Instructions: Apply moisturizing lotion as directed Topical: Gentamicin 1 x  Per Week/30 Days Discharge Instructions: As directed by physician Topical: Mupirocin Ointment 1 x Per Week/30 Days Discharge Instructions: Apply Mupirocin (Bactroban) as instructed Prim Dressing: Hydrofera Blue Ready Transfer Foam, 8x8 (in/in) 1 x Per Week/30 Days ary Discharge Instructions: Apply to wound bed as instructed Secondary Dressing: ABD Pad, 8x10 1 x Per Week/30 Days Discharge Instructions: Apply over primary dressing as directed. Com pression Wrap: Kerlix Roll 4.5x3.1 (in/yd) 1 x Per Week/30 Days Discharge Instructions: Apply Kerlix and Coban compression as directed. Com pression Wrap: Coban Self-Adherent Wrap 4x5 (in/yd) 1 x Per Week/30 Days Discharge Instructions: Apply over Kerlix as directed. 1. Hydrofera Blue under Kerlix/Coban 2. Follow-up in 1 week Electronic Signature(s) Signed: 01/28/2023 4:55:26 PM By: Geralyn Corwin DO Entered By: Geralyn Corwin on 01/28/2023  16:37:26 -------------------------------------------------------------------------------- HxROS Details Patient Name: Date of Service: [name], [place] RNA 01/28/2023 3:30 PM Medical Record Number: [id number] Patient Account Number: [id number] Date of Birth/Sex: Treating RN: Jul 16, 1923 (89 y.o. F) Primary Care Provider: Ardean Larsen Other Clinician: Referring Provider: Treating Provider/Extender: [name], [name] in Treatment: 10 Respiratory Medical History: Positive for: Asthma Cardiovascular Medical History: Positive for: Hypertension Past Medical History Notes: aortic stenosis Gastrointestinal Medical History: Past Medical History Notes: rectal Ca Integumentary (Skin) Medical History: Past Medical History Notes: left lower leg cellulitis Oncologic Medical History: Negative for: Received Chemotherapy; Received Radiation Past Medical History Notes: 2008 rectal Ca Immunizations Pneumococcal Vaccine: [name], [name] ([id number]) [id number] Page 7 of 7 Received Pneumococcal Vaccination: No Implantable Devices No devices added Hospitalization / Surgery History Type of Hospitalization/Surgery 2023 SBO, inguinal hernia, gangrene, FTT- bowel resection tonsillectomy and adenoidectomy Family and Social History Never smoker; Alcohol Use: Never; Drug Use: No History; Caffeine Use: Never; Financial Concerns: No; Food, Clothing or Shelter Needs: No; Support System Lacking: No; Transportation Concerns: No Electronic Signature(s) Signed: 01/28/2023 4:55:26 PM By: Geralyn Corwin DO Entered By: Geralyn Corwin on 01/28/2023 16:33:19 -------------------------------------------------------------------------------- SuperBill Details Patient Name: Date of Service: [name], [place] RNA 01/28/2023 Medical Record Number: [id number] Patient Account Number: [id number] Date of Birth/Sex: Treating RN: 04-13-1924 (89 y.o. 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 I10 Essential (primary) hypertension Facility Procedures : CPT4 Code: [id number] Description: 99213 - WOUND CARE VISIT-LEV 3 EST PT Modifier: Quantity: 1 Physician Procedures : CPT4 Code Description Modifier [id number] - WC PHYS LEVEL 3 - EST PT ICD-10 Diagnosis Description L97.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

## 2023-01-30 NOTE — Progress Notes (Signed)
[place], [name] ([id number]) [id number] Page 1 of 9 Visit Report for 01/28/2023 Arrival Information Details Patient Name: Date of ServiceCorinna Faulkner, [name] RNA 01/28/2023 3:30 PM Medical Record Number: [id number] Patient Account Number: [id number] Date of Birth/Sex: Treating RN: 08-14-1923 (89 y.o. F) Primary Care [name]: [name] Other Clinician: Referring [name]: Treating [name]/Extender: [name], [name] in Treatment: 10 Visit Information History Since Last Visit Added or deleted any medications: No Patient Arrived: Ambulatory Any new allergies or adverse reactions: No Arrival Time: 15:49 Had a fall or experienced change in No Accompanied By: daughter activities of daily living that may affect Transfer Assistance: None risk of falls: Patient Identification Verified: Yes Signs or symptoms of abuse/neglect since last visito No Secondary Verification Process Completed: Yes Hospitalized since last visit: No Patient Requires Transmission-Based Precautions: No Implantable device outside of the clinic excluding No Patient Has Alerts: Yes cellular tissue based products placed in the center Patient Alerts: ABI R 0.96 (11/29/22) since last visit: ABI Faulkner 1.25 (11/29/22) Has Dressing in Place as Prescribed: Yes Has Compression in Place as Prescribed: Yes Pain Present Now: No Electronic Signature(s) Signed: 01/30/2023 11:54:52 AM By: Thayer Dallas Entered By: Thayer Dallas on 01/28/2023 15:50:15 -------------------------------------------------------------------------------- Clinic Level of Care Assessment Details Patient Name: Date of ServiceTorrie Faulkner RNA 01/28/2023 3:30 PM Medical Record Number: [id number] Patient Account Number: [id number] Date of Birth/Sex: Treating RN: 1924-01-11 (89 y.o. [name] Primary Care [name]: [name] Other Clinician: Referring Shaylynne Lunt: Treating [name]/Extender: [name], [name] in Treatment: 10 Clinic Level of Care Assessment Items TOOL 4 Quantity Score X- 1 0 Use when only an EandM is performed on FOLLOW-UP visit ASSESSMENTS - Nursing Assessment / Reassessment X- 1 10 Reassessment of Co-morbidities (includes updates in patient status) X- 1 5 Reassessment of Adherence to Treatment Plan ASSESSMENTS - Wound and Skin A ssessment / Reassessment X - Simple Wound Assessment / Reassessment - one wound 1 5 []  - 0 Complex Wound Assessment / Reassessment - multiple wounds []  - 0 Dermatologic / Skin Assessment (not related to wound area) ASSESSMENTS - Focused Assessment X- 1 5 Circumferential Edema Measurements - multi extremities []  - 0 Nutritional Assessment / Counseling / Intervention [name] ([id number]) [id number] Page 2 of 9 []  - 0 Lower Extremity Assessment (monofilament, tuning fork, pulses) []  - 0 Peripheral Arterial Disease Assessment (using hand held doppler) ASSESSMENTS - Ostomy and/or Continence Assessment and Care []  - 0 Incontinence Assessment and Management []  - 0 Ostomy Care Assessment and Management (repouching, etc.) PROCESS - Coordination of Care X - Simple Patient / Family Education for ongoing care 1 15 []  - 0 Complex (extensive) Patient / Family Education for ongoing care X- 1 10 Staff obtains Chiropractor, Records, T Results / Process Orders est []  - 0 Staff telephones HHA, Nursing Homes / Clarify orders / etc []  - 0 Routine Transfer to another Facility (non-emergent condition) []  - 0 Routine Hospital Admission (non-emergent condition) []  - 0 New Admissions / Manufacturing engineer / Ordering NPWT Apligraf, etc. , []  - 0 Emergency Hospital Admission (emergent condition) X- 1 10 Simple Discharge Coordination []  - 0 Complex (extensive) Discharge Coordination PROCESS - Special Needs []  - 0 Pediatric / Minor Patient Management []  - 0 Isolation Patient  Management []  - 0 Hearing / Language / Visual special needs []  - 0 Assessment of Community assistance (transportation, D/C planning, etc.) []  - 0 Additional assistance / Altered mentation []  - 0 Support Surface(s) Assessment (bed, cushion, seat, etc.)  INTERVENTIONS - Wound Cleansing / Measurement X - Simple Wound Cleansing - one wound 1 5 []  - 0 Complex Wound Cleansing - multiple wounds X- 1 5 Wound Imaging (photographs - any number of wounds) []  - 0 Wound Tracing (instead of photographs) X- 1 5 Simple Wound Measurement - one wound []  - 0 Complex Wound Measurement - multiple wounds INTERVENTIONS - Wound Dressings X - Small Wound Dressing one or multiple wounds 1 10 []  - 0 Medium Wound Dressing one or multiple wounds []  - 0 Large Wound Dressing one or multiple wounds []  - 0 Application of Medications - topical []  - 0 Application of Medications - injection INTERVENTIONS - Miscellaneous []  - 0 External ear exam []  - 0 Specimen Collection (cultures, biopsies, blood, body fluids, etc.) []  - 0 Specimen(s) / Culture(s) sent or taken to Lab for analysis []  - 0 Patient Transfer (multiple staff / Nurse, adult / Similar devices) []  - 0 Simple Staple / Suture removal (25 or less) []  - 0 Complex Staple / Suture removal (26 or more) []  - 0 Hypo / Hyperglycemic Management (close monitor of Blood Glucose) [name] ([id number]) [phone].pdf Page 3 of 9 []  - 0 Ankle / Brachial Index (ABI) - do not check if billed separately X- 1 5 Vital Signs Has the patient been seen at the hospital within the last three years: Yes Total Score: 90 Level Of Care: New/Established - Level 3 Electronic Signature(s) Signed: 01/30/2023 11:11:00 AM By: Brenton Grills Entered By: Brenton Grills on 01/28/2023 16:28:48 -------------------------------------------------------------------------------- Encounter Discharge Information Details Patient Name: Date of Service: [name], [place] RNA 01/28/2023 3:30 PM Medical Record Number: [id number] Patient Account Number: [id number] Date of Birth/Sex: Treating RN: Jun 01, 1924 (89 y.o. [name] Primary Care [name]: [name] Other Clinician: Referring Julie Lisowski: Treating Zymier Rodgers/Extender: [name]: 10 Encounter Discharge Information Items Discharge Condition: Stable Ambulatory Status: Ambulatory Discharge Destination: Home Transportation: Private Auto Accompanied By: daughter Schedule Follow-up Appointment: Yes Clinical Summary of Care: Patient Declined Electronic Signature(s) Signed: 01/30/2023 11:11:00 AM By: Brenton Grills Entered By: Brenton Grills on 01/28/2023 16:31:15 -------------------------------------------------------------------------------- Lower Extremity Assessment Details Patient Name: Date of ServiceTorrie Faulkner RNA 01/28/2023 3:30 PM Medical Record Number: [id number] Patient Account Number: [id number] Date of Birth/Sex: Treating RN: 1923-09-17 (89 y.o. F) Primary Care Sriram Febles: [name] Other Clinician: Referring Kourtland Coopman: Treating Jahzeel Poythress/Extender: [name], [name] in Treatment: 10 Edema Assessment Assessed: [Left: No] [Right: No] Edema: [Left: Ye] [Right: s] Calf Left: Right: Point of Measurement: 31 cm From Medial Instep 29.3 cm Ankle Left: Right: Point of Measurement: 10 cm From Medial Instep 19.5 cm Electronic Signature(s) [place], [name] ([id number]) [phone].pdf Page 4 of 9 Signed: 01/30/2023 11:54:52 AM By: Thayer Dallas Entered By: Thayer Dallas on 01/28/2023 15:49:47 -------------------------------------------------------------------------------- Multi Wound Chart Details Patient Name: Date of Service: [name], [place] RNA 01/28/2023 3:30 PM Medical Record Number: [id number] Patient Account Number: [id number] Date of Birth/Sex: Treating RN: 1924/01/27 (89 y.o. F) Primary  Care Vala Raffo: [name] Other Clinician: Referring Shadonna Benedick: Treating Zakiah Beckerman/Extender: [name], [name] in Treatment: 10 Vital Signs Height(in): 62 Pulse(bpm): 81 Weight(lbs): 120 Blood Pressure(mmHg): 175/81 Body Mass Index(BMI): 21.9 Temperature(F): 98.2 Respiratory Rate(breaths/min): 18 [2:Photos:] [N/A:N/A] Right, Medial Ankle N/A N/A Wound Location: Gradually Appeared N/A N/A Wounding Event: Venous Leg Ulcer N/A N/A Primary Etiology: Asthma, Hypertension N/A N/A Comorbid History: 06/08/2022 N/A N/A Date Acquired: 10 N/A N/A Faulkner of Treatment: Open N/A N/A Wound Status: No N/A N/A Wound Recurrence:  0.3x0.5x0.1 N/A N/A Measurements Faulkner x W x D (cm) 0.118 N/A N/A A (cm) : rea 0.012 N/A N/A Volume (cm) : 6.30% N/A N/A % Reduction in A rea: 7.70% N/A N/A % Reduction in Volume: Full Thickness Without Exposed N/A N/A Classification: Support Structures Medium N/A N/A Exudate Amount: Serosanguineous N/A N/A Exudate Type: red, brown N/A N/A Exudate Color: Small (1-33%) N/A N/A Granulation Amount: Large (67-100%) N/A N/A Necrotic Amount: Fat Layer (Subcutaneous Tissue): Yes N/A N/A Exposed Structures: Fascia: No Tendon: No Muscle: No Joint: No Bone: No Small (1-33%) N/A N/A Epithelialization: Excoriation: No N/A N/A Periwound Skin Texture: Induration: No Callus: No Crepitus: No Rash: No Scarring: No Maceration: No N/A N/A Periwound Skin Moisture: Dry/Scaly: No Atrophie Blanche: No N/A N/A Periwound Skin Color: Cyanosis: No Ecchymosis: No Erythema: No Hemosiderin Staining: No Mottled: No Pallor: No Rubor: No LENORE, MOYANO ([id number]) [id number] Page 5 of 9 Treatment Notes Wound #2 (Ankle) Wound Laterality: Right, Medial Cleanser Soap and Water Discharge Instruction: May shower and wash wound with dial antibacterial soap and water prior to dressing change. Vashe 5.8 (oz) Discharge  Instruction: Cleanse the wound with Vashe prior to applying a clean dressing using gauze sponges, not tissue or cotton balls. Peri-Wound Care Triamcinolone 15 (g) Discharge Instruction: Use triamcinolone 15 (g) as directed Sween Lotion (Moisturizing lotion) Discharge Instruction: Apply moisturizing lotion as directed Topical Gentamicin Discharge Instruction: As directed by physician Mupirocin Ointment Discharge Instruction: Apply Mupirocin (Bactroban) as instructed Primary Dressing Hydrofera Blue Ready Transfer Foam, 8x8 (in/in) Discharge Instruction: Apply to wound bed as instructed Secondary Dressing ABD Pad, 8x10 Discharge Instruction: Apply over primary dressing as directed. Secured With Compression Wrap Kerlix Roll 4.5x3.1 (in/yd) Discharge Instruction: Apply Kerlix and Coban compression as directed. Coban Self-Adherent Wrap 4x5 (in/yd) Discharge Instruction: Apply over Kerlix as directed. Compression Stockings Add-Ons Electronic Signature(s) Signed: 01/28/2023 4:55:26 PM By: Geralyn Corwin DO Entered By: Geralyn Corwin on 01/28/2023 16:32:29 -------------------------------------------------------------------------------- Multi-Disciplinary Care Plan Details Patient Name: Date of Service: [name], [place] RNA 01/28/2023 3:30 PM Medical Record Number: [id number] Patient Account Number: [id number] Date of Birth/Sex: Treating RN: 1923-09-29 (89 y.o. [name] Primary Care Zareena Willis: [name] Other Clinician: Referring Fredy Gladu: Treating Fleur Audino/Extender: [name], [name] in Treatment: 10 Active Inactive Pain, Acute or Chronic Nursing Diagnoses: Pain, acute or chronic: actual or potential TEAH, VOTAW ([id number]) [phone].pdf Page 6 of 9 Potential alteration in comfort, pain Goals: Patient will verbalize adequate pain control and receive pain control interventions during procedures as needed Date Initiated:  11/13/2022 Date Inactivated: 12/18/2022 Target Resolution Date: 02/06/2023 Goal Status: Met Patient/caregiver will verbalize comfort level met Date Initiated: 11/13/2022 Target Resolution Date: 02/06/2023 Goal Status: Active Interventions: Assess comfort goal upon admission Encourage patient to take pain medications as prescribed Treatment Activities: Administer pain control measures as ordered : 11/13/2022 Notes: Venous Leg Ulcer Nursing Diagnoses: Knowledge deficit related to disease process and management Goals: Patient will maintain optimal edema control Date Initiated: 11/13/2022 Target Resolution Date: 02/06/2023 Goal Status: Active Interventions: Assess peripheral edema status every visit. Compression as ordered Provide education on venous insufficiency Notes: Wound/Skin Impairment Nursing Diagnoses: Knowledge deficit related to ulceration/compromised skin integrity Goals: Patient/caregiver will verbalize understanding of skin care regimen Date Initiated: 11/13/2022 Target Resolution Date: 02/06/2023 Goal Status: Active Interventions: Assess patient/caregiver ability to perform ulcer/skin care regimen upon admission and as needed Assess ulceration(s) every visit Provide education on ulcer and skin care Treatment Activities: Skin care regimen initiated : 11/13/2022 Topical wound management initiated : 11/13/2022  Notes: Electronic Signature(s) Signed: 01/30/2023 11:11:00 AM By: Brenton Grills Entered By: Brenton Grills on 01/28/2023 16:15:19 -------------------------------------------------------------------------------- Pain Assessment Details Patient Name: Date of Service: [name] RNA 01/28/2023 3:30 PM Medical Record Number: [id number] Patient Account Number: [id number] Date of Birth/Sex: Treating RN: 1923/12/11 (89 y.o. F) Primary Care Welton Bord: [name] Other Clinician: Referring Veronika Heard: Treating Meldrick Buttery/Extender: [name], [name] in  Treatment: 10 [place], [name] ([id number]) [id number] Page 7 of 9 Active Problems Location of Pain Severity and Description of Pain Patient Has Paino No Site Locations Pain Management and Medication Current Pain Management: Electronic Signature(s) Signed: 01/30/2023 11:54:52 AM By: Thayer Dallas Entered By: Thayer Dallas on 01/28/2023 15:49:56 -------------------------------------------------------------------------------- Patient/Caregiver Education Details Patient Name: Date of Service: [name], Marton [place] RNA 7/22/2024andnbsp3:30 PM Medical Record Number: [id number] Patient Account Number: [id number] Date of Birth/Gender: Treating RN: 1924-04-11 (89 y.o. [name] Primary Care Physician: [name] Other Clinician: Referring Physician: Treating Physician/Extender: [name]: 10 Education Assessment Education Provided To: Patient and Caregiver Education Topics Provided Wound/Skin Impairment: Methods: Explain/Verbal Responses: State content correctly Electronic Signature(s) Signed: 01/30/2023 11:11:00 AM By: Brenton Grills Entered By: Brenton Grills on 01/28/2023 16:15:53 [name] ([id number]) [id number] Page 8 of 9 -------------------------------------------------------------------------------- Wound Assessment Details Patient Name: Date of ServiceCorinna Faulkner, [name] RNA 01/28/2023 3:30 PM Medical Record Number: [id number] Patient Account Number: [id number] Date of Birth/Sex: Treating RN: [birth year] (89 y.o. F) Primary Care Ryn Peine: [name] Other Clinician: Referring Alona Danford: Treating Nyles Mitton/Extender: [name], [name] in Treatment: 10 Wound Status Wound Number: 2 Primary Etiology: Venous Leg Ulcer Wound Location: Right, Medial Ankle Wound Status: Open Wounding Event: Gradually Appeared Comorbid History: Asthma, Hypertension Date Acquired:  06/08/2022 Faulkner Of Treatment: 10 Clustered Wound: No Photos Wound Measurements Length: (cm) Width: (cm) Depth: (cm) Area: (cm) Volume: (cm) 0.3 % Reduction in Area: 6.3% 0.5 % Reduction in Volume: 7.7% 0.1 Epithelialization: Small (1-33%) 0.118 Tunneling: No 0.012 Undermining: No Wound Description Classification: Full Thickness Without Exposed Sup Exudate Amount: Medium Exudate Type: Serosanguineous Exudate Color: red, brown port Structures Wound Bed Granulation Amount: Small (1-33%) Exposed Structure Necrotic Amount: Large (67-100%) Fascia Exposed: No Necrotic Quality: Adherent Slough Fat Layer (Subcutaneous Tissue) Exposed: Yes Tendon Exposed: No Muscle Exposed: No Joint Exposed: No Bone Exposed: No Periwound Skin Texture Texture Color No Abnormalities Noted: No No Abnormalities Noted: No Callus: No Atrophie Blanche: No Crepitus: No Cyanosis: No Excoriation: No Ecchymosis: No Induration: No Erythema: No Rash: No Hemosiderin Staining: No Scarring: No Mottled: No Pallor: No Moisture Rubor: No No Abnormalities Noted: No Dry / Scaly: No Maceration: No Treatment Notes Wound #2 (Ankle) Wound Laterality: Right, [address] [place], [name] ([id number]) [phone].pdf Page 9 of 9 Discharge Instruction: May shower and wash wound with dial antibacterial soap and water prior to dressing change. Vashe 5.8 (oz) Discharge Instruction: Cleanse the wound with Vashe prior to applying a clean dressing using gauze sponges, not tissue or cotton balls. Peri-Wound Care Triamcinolone 15 (g) Discharge Instruction: Use triamcinolone 15 (g) as directed Sween Lotion (Moisturizing lotion) Discharge Instruction: Apply moisturizing lotion as directed Topical Gentamicin Discharge Instruction: As directed by physician Mupirocin Ointment Discharge Instruction: Apply Mupirocin (Bactroban) as instructed Primary Dressing Hydrofera Blue  Ready Transfer Foam, 8x8 (in/in) Discharge Instruction: Apply to wound bed as instructed Secondary Dressing ABD Pad, 8x10 Discharge Instruction: Apply over primary dressing as directed. Secured With Compression Wrap Kerlix Roll 4.5x3.1 (in/yd) Discharge Instruction: Apply Kerlix and Coban compression as directed. Coban  Self-Adherent Wrap 4x5 (in/yd) Discharge Instruction: Apply over Kerlix as directed. Compression Stockings Add-Ons Electronic Signature(s) Signed: 01/30/2023 11:54:52 AM By: Thayer Dallas Entered By: Thayer Dallas on 01/28/2023 15:54:00 -------------------------------------------------------------------------------- Vitals Details Patient Name: Date of Service: [name], [place] RNA 01/28/2023 3:30 PM Medical Record Number: [id number] Patient Account Number: [id number] Date of Birth/Sex: Treating RN: 08-06-1923 (89 y.o. F) Primary Care Niko Jakel: [name] Other Clinician: Referring Leondre Taul: Treating Taz Vanness/Extender: [name], [name] in Treatment: 10 Vital Signs Time Taken: 15:51 Temperature (F): 98.2 Height (in): 62 Pulse (bpm): 81 Weight (lbs): 120 Respiratory Rate (breaths/min): 18 Body Mass Index (BMI): 21.9 Blood Pressure (mmHg): 175/81 Reference Range: 80 - 120 mg / dl Electronic Signature(s) Signed: 01/30/2023 11:54:52 AM By: Thayer Dallas Entered By: Thayer Dallas on 01/28/2023 15:55:27

## 2023-02-04 ENCOUNTER — Encounter (HOSPITAL_BASED_OUTPATIENT_CLINIC_OR_DEPARTMENT_OTHER): Payer: 59 | Admitting: Internal Medicine

## 2023-02-04 DIAGNOSIS — I87331 Chronic venous hypertension (idiopathic) with ulcer and inflammation of right lower extremity: Secondary | ICD-10-CM

## 2023-02-04 DIAGNOSIS — I10 Essential (primary) hypertension: Secondary | ICD-10-CM

## 2023-02-04 DIAGNOSIS — I70233 Atherosclerosis of native arteries of right leg with ulceration of ankle: Secondary | ICD-10-CM | POA: Diagnosis not present

## 2023-02-04 DIAGNOSIS — L97311 Non-pressure chronic ulcer of right ankle limited to breakdown of skin: Secondary | ICD-10-CM

## 2023-02-05 NOTE — Progress Notes (Signed)
[place], [name] ([id number]) [id number] Page 1 of 8 Visit Report for 02/04/2023 Arrival Information Details Patient Name: Date of ServiceCorinna Faulkner, [name] RNA 02/04/2023 3:45 PM Medical Record Number: [id number] Patient Account Number: [id number] Date of Birth/Sex: Treating RN: [birth year] (89 y.o. Debara Pickett, [place] Primary Care Thomasine Klutts: Ardean Larsen Other Clinician: Referring Jaslene Marsteller: Treating Trinia Georgi/Extender: [name]: 11 Visit Information History Since Last Visit Added or deleted any medications: No Patient Arrived: Ambulatory Any new allergies or adverse reactions: No Arrival Time: 16:00 Had a fall or experienced change in No Accompanied By: daughter activities of daily living that may affect Transfer Assistance: None risk of falls: Patient Identification Verified: Yes Signs or symptoms of abuse/neglect since last visito No Secondary Verification Process Completed: Yes Hospitalized since last visit: No Patient Requires Transmission-Based Precautions: No Implantable device outside of the clinic excluding No Patient Has Alerts: Yes cellular tissue based products placed in the center Patient Alerts: ABI R 0.96 (11/29/22) since last visit: ABI L 1.25 (11/29/22) Has Dressing in Place as Prescribed: Yes Has Compression in Place as Prescribed: Yes Pain Present Now: No Electronic Signature(s) Signed: 02/04/2023 6:06:14 PM By: Shawn Stall RN, BSN Entered By: Shawn Stall on 02/04/2023 16:07:33 -------------------------------------------------------------------------------- Clinic Level of Care Assessment Details Patient Name: Date of ServiceTorrie Faulkner RNA 02/04/2023 3:45 PM Medical Record Number: [id number] Patient Account Number: [id number] Date of Birth/Sex: Treating RN: 01-25-1924 (89 y.o. [name] Primary Care Alvin Rubano: Ardean Larsen Other Clinician: Referring Treniece Holsclaw: Treating Elif Yonts/Extender:  [name]: 11 Clinic Level of Care Assessment Items TOOL 4 Quantity Score X- 1 0 Use when only an EandM is performed on FOLLOW-UP visit ASSESSMENTS - Nursing Assessment / Reassessment X- 1 10 Reassessment of Co-morbidities (includes updates in patient status) X- 1 5 Reassessment of Adherence to Treatment Plan ASSESSMENTS - Wound and Skin A ssessment / Reassessment X - Simple Wound Assessment / Reassessment - one wound 1 5 []  - 0 Complex Wound Assessment / Reassessment - multiple wounds []  - 0 Dermatologic / Skin Assessment (not related to wound area) ASSESSMENTS - Focused Assessment X- 1 5 Circumferential Edema Measurements - multi extremities []  - 0 Nutritional Assessment / Counseling / Intervention []  - 0 Lower Extremity Assessment (monofilament, tuning fork, pulses) []  - 0 Peripheral Arterial Disease Assessment (using hand held doppler) ASSESSMENTS - Ostomy and/or Continence Assessment and Care []  - 0 Incontinence Assessment and Management []  - 0 Ostomy Care Assessment and Management (repouching, etc.) PROCESS - Coordination of Care [place], [name] ([id number]) [phone].pdf Page 2 of 8 X- 1 15 Simple Patient / Family Education for ongoing care []  - 0 Complex (extensive) Patient / Family Education for ongoing care X- 1 10 Staff obtains Chiropractor, Records, T Results / Process Orders est []  - 0 Staff telephones HHA, Nursing Homes / Clarify orders / etc []  - 0 Routine Transfer to another Facility (non-emergent condition) []  - 0 Routine Hospital Admission (non-emergent condition) []  - 0 New Admissions / Manufacturing engineer / Ordering NPWT Apligraf, etc. , []  - 0 Emergency Hospital Admission (emergent condition) []  - 0 Simple Discharge Coordination []  - 0 Complex (extensive) Discharge Coordination PROCESS - Special Needs []  - 0 Pediatric / Minor Patient Management []  - 0 Isolation Patient  Management []  - 0 Hearing / Language / Visual special needs []  - 0 Assessment of Community assistance (transportation, D/C planning, etc.) []  - 0 Additional assistance / Altered mentation []  - 0 Support Surface(s) Assessment (bed,  cushion, seat, etc.) INTERVENTIONS - Wound Cleansing / Measurement X - Simple Wound Cleansing - one wound 1 5 []  - 0 Complex Wound Cleansing - multiple wounds X- 1 5 Wound Imaging (photographs - any number of wounds) []  - 0 Wound Tracing (instead of photographs) X- 1 5 Simple Wound Measurement - one wound []  - 0 Complex Wound Measurement - multiple wounds INTERVENTIONS - Wound Dressings X - Small Wound Dressing one or multiple wounds 1 10 []  - 0 Medium Wound Dressing one or multiple wounds []  - 0 Large Wound Dressing one or multiple wounds []  - 0 Application of Medications - topical []  - 0 Application of Medications - injection INTERVENTIONS - Miscellaneous []  - 0 External ear exam []  - 0 Specimen Collection (cultures, biopsies, blood, body fluids, etc.) []  - 0 Specimen(s) / Culture(s) sent or taken to Lab for analysis []  - 0 Patient Transfer (multiple staff / Nurse, adult / Similar devices) []  - 0 Simple Staple / Suture removal (25 or less) []  - 0 Complex Staple / Suture removal (26 or more) []  - 0 Hypo / Hyperglycemic Management (close monitor of Blood Glucose) []  - 0 Ankle / Brachial Index (ABI) - do not check if billed separately X- 1 5 Vital Signs Has the patient been seen at the hospital within the last three years: Yes Total Score: 80 Level Of Care: New/Established - Level 3 Electronic Signature(s) Signed: 02/04/2023 5:33:27 PM By: Redmond Pulling RN, BSN [place], [name] ([id number]) [phone].pdf Page 3 of 8 Entered By: Redmond Pulling on 02/04/2023 16:29:57 -------------------------------------------------------------------------------- Encounter Discharge Information Details Patient Name: Date of  Service: [name] RNA 02/04/2023 3:45 PM Medical Record Number: [id number] Patient Account Number: [id number] Date of Birth/Sex: Treating RN: 05-28-1924 (89 y.o. [name] Primary Care Casimiro Lienhard: Ardean Larsen Other Clinician: Referring Karam Dunson: Treating Burech Mcfarland/Extender: [name]: 69 Encounter Discharge Information Items Discharge Condition: Stable Ambulatory Status: Ambulatory Discharge Destination: Home Transportation: Private Auto Accompanied By: daughter Schedule Follow-up Appointment: Yes Clinical Summary of Care: Patient Declined Electronic Signature(s) Signed: 02/04/2023 5:33:27 PM By: Redmond Pulling RN, BSN Entered By: Redmond Pulling on 02/04/2023 16:30:45 -------------------------------------------------------------------------------- Lower Extremity Assessment Details Patient Name: Date of Service: [name] RNA 02/04/2023 3:45 PM Medical Record Number: [id number] Patient Account Number: [id number] Date of Birth/Sex: Treating RN: Nov 22, 1923 (89 y.o. Arta Silence Primary Care Jakevion Arney: Ardean Larsen Other Clinician: Referring Braya Habermehl: Treating Kortney Schoenfelder/Extender: [name], [name] in Treatment: 11 Edema Assessment Assessed: [Left: No] [Right: Yes] Edema: [Left: N] [Right: o] Calf Left: Right: Point of Measurement: 31 cm From Medial Instep 29 cm Ankle Left: Right: Point of Measurement: 10 cm From Medial Instep 19 cm Vascular Assessment Pulses: Dorsalis Pedis Palpable: [Right:Yes] Extremity colors, hair growth, and conditions: Extremity Color: [Right:Hyperpigmented] Hair Growth on Extremity: [Right:No] Temperature of Extremity: [Right:Warm] Capillary Refill: [Right:< 3 seconds] Dependent Rubor: [Right:No] Blanched when Elevated: [Right:No No] Toe Nail Assessment Left: Right: Thick: No Discolored: No Deformed: No Improper Length and HygieneTOBYN, CHICA ([id number])  [id number] Page 4 of 8 Electronic Signature(s) Signed: 02/04/2023 6:06:14 PM By: Shawn Stall RN, BSN Entered By: Shawn Stall on 02/04/2023 16:08:22 -------------------------------------------------------------------------------- Multi Wound Chart Details Patient Name: Date of Service: [name], [place] RNA 02/04/2023 3:45 PM Medical Record Number: [id number] Patient Account Number: [id number] Date of Birth/Sex: Treating RN: [birth year] (89 y.o. F) Primary Care Nusaiba Guallpa: Ardean Larsen Other Clinician: Referring Allisa Einspahr: Treating Undra Harriman/Extender: [name], [name] in Treatment: 11 Vital Signs Height(in): 62  Pulse(bpm): 75 Weight(lbs): 120 Blood Pressure(mmHg): 172/73 Body Mass Index(BMI): 21.9 Temperature(F): 98.3 Respiratory Rate(breaths/min): 16 [2:Photos: Right, Medial Ankle Wound Location: Gradually Appeared Wounding Event: Venous Leg Ulcer Primary Etiology: Asthma, Hypertension Comorbid History: 06/08/2022 Date Acquired: 11 Weeks of Treatment: Open Wound Status: No Wound Recurrence:  0.2x0.3x0.1 Measurements L x W x D (cm) 0.047 A (cm) : rea 0.005 Volume (cm) : 62.70% % Reduction in A rea: 61.50% % Reduction in Volume: Full Thickness Without Exposed Classification: Support Structures Medium Exudate Amount: Serosanguineous Exudate  Type: red, brown Exudate Color: Distinct, outline attached Wound Margin: Large (67-100%) Granulation Amount: Small (1-33%) Necrotic Amount: Fat Layer (Subcutaneous Tissue): Yes N/A Exposed Structures: Fascia: No Tendon: No Muscle: No Joint: No Bone: No  Large (67-100%) Epithelialization: Excoriation: No Periwound Skin Texture: Induration: No Callus: No Crepitus: No Rash: No Scarring: No Maceration: No Periwound Skin Moisture: Dry/Scaly: No Atrophie Blanche: No Periwound Skin Color: Cyanosis: No  Ecchymosis: No Erythema: No Hemosiderin Staining: No Mottled: No Pallor: No Rubor: No] [N/A:N/A N/A N/A N/A N/A N/A N/A  N/A N/A N/A N/A N/A N/A N/A N/A N/A N/A N/A N/A N/A N/A N/A N/A N/A N/A] Treatment Notes Electronic Signature(s) Signed: 02/04/2023 5:19:21 PM By: Geralyn Corwin DO Entered By: Geralyn Corwin on 02/04/2023 16:19:40 [name] ([id number]) [id number].pdf Page 5 of 8 -------------------------------------------------------------------------------- Multi-Disciplinary Care Plan Details Patient Name: Date of ServiceTorrie Faulkner RNA 02/04/2023 3:45 PM Medical Record Number: [id number] Patient Account Number: [id number] Date of Birth/Sex: Treating RN: 06/07/1924 (89 y.o. [name] Primary Care Siyon Linck: Ardean Larsen Other Clinician: Referring Nakari Bracknell: Treating Kahlyn Shippey/Extender: [name], [name] in Treatment: 66 Active Inactive Pain, Acute or Chronic Nursing Diagnoses: Pain, acute or chronic: actual or potential Potential alteration in comfort, pain Goals: Patient will verbalize adequate pain control and receive pain control interventions during procedures as needed Date Initiated: 11/13/2022 Date Inactivated: 12/18/2022 Target Resolution Date: 02/06/2023 Goal Status: Met Patient/caregiver will verbalize comfort level met Date Initiated: 11/13/2022 Target Resolution Date: 02/06/2023 Goal Status: Active Interventions: Assess comfort goal upon admission Encourage patient to take pain medications as prescribed Treatment Activities: Administer pain control measures as ordered : 11/13/2022 Notes: Venous Leg Ulcer Nursing Diagnoses: Knowledge deficit related to disease process and management Goals: Patient will maintain optimal edema control Date Initiated: 11/13/2022 Target Resolution Date: 02/06/2023 Goal Status: Active Interventions: Assess peripheral edema status every visit. Compression as ordered Provide education on venous insufficiency Notes: Wound/Skin Impairment Nursing Diagnoses: Knowledge deficit related to  ulceration/compromised skin integrity Goals: Patient/caregiver will verbalize understanding of skin care regimen Date Initiated: 11/13/2022 Target Resolution Date: 02/06/2023 Goal Status: Active Interventions: Assess patient/caregiver ability to perform ulcer/skin care regimen upon admission and as needed Assess ulceration(s) every visit Provide education on ulcer and skin care Treatment Activities: Skin care regimen initiated : 11/13/2022 Topical wound management initiated : 11/13/2022 Notes: FLARENCE, EXANTUS ([id number]) [phone].pdf Page 6 of 8 Electronic Signature(s) Signed: 02/04/2023 5:33:27 PM By: Redmond Pulling RN, BSN Entered By: Redmond Pulling on 02/04/2023 16:13:20 -------------------------------------------------------------------------------- Pain Assessment Details Patient Name: Date of Service: [name] RNA 02/04/2023 3:45 PM Medical Record Number: [id number] Patient Account Number: [id number] Date of Birth/Sex: Treating RN: [birth year] (89 y.o. Arta Silence Primary Care [place] Wherry: Ardean Larsen Other Clinician: Referring Martita Brumm: Treating Azelie Noguera/Extender: [name], [name] in Treatment: 11 Active Problems Location of Pain Severity and Description of Pain Patient Has Paino No Site Locations Pain Management and Medication Current Pain Management: Electronic Signature(s) Signed: 02/04/2023 6:06:14 PM By: Elesa Hacker,  Yvonne Kendall RN, BSN Entered By: Shawn Stall on 02/04/2023 16:07:47 -------------------------------------------------------------------------------- Patient/Caregiver Education Details Patient Name: Date of Service: [name] RNA 7/29/2024andnbsp3:45 PM Medical Record Number: [id number] Patient Account Number: [id number] Date of Birth/Gender: Treating RN: Jun 20, 1924 (89 y.o. [name] Primary Care Physician: Ardean Larsen Other Clinician: Referring Physician: Treating Physician/Extender: [name]: 11 Education Assessment Education Provided To: Patient Education Topics Provided Wound/Skin Impairment: Methods: Explain/Verbal Responses: State content correctly [employer]) [place], [name] ([id number]) [id number] Page 7 of 8 Signed: 02/04/2023 5:33:27 PM By: Redmond Pulling RN, BSN Entered By: Redmond Pulling on 02/04/2023 16:13:34 -------------------------------------------------------------------------------- Wound Assessment Details Patient Name: Date of Service: [name] RNA 02/04/2023 3:45 PM Medical Record Number: [id number] Patient Account Number: [id number] Date of Birth/Sex: Treating RN: October 30, 1923 (89 y.o. Debara Pickett, [place] Primary Care Bravery Ketcham: Ardean Larsen Other Clinician: Referring Yavuz Kirby: Treating Charly Holcomb/Extender: [name], [name] in Treatment: 11 Wound Status Wound Number: 2 Primary Etiology: Venous Leg Ulcer Wound Location: Right, Medial Ankle Wound Status: Open Wounding Event: Gradually Appeared Comorbid History: Asthma, Hypertension Date Acquired: 06/08/2022 Weeks Of Treatment: 11 Clustered Wound: No Photos Wound Measurements Length: (cm) 0.2 Width: (cm) 0.3 Depth: (cm) 0.1 Area: (cm) 0.047 Volume: (cm) 0.005 % Reduction in Area: 62.7% % Reduction in Volume: 61.5% Epithelialization: Large (67-100%) Tunneling: No Undermining: No Wound Description Classification: Full Thickness Without Exposed Support Structures Wound Margin: Distinct, outline attached Exudate Amount: Medium Exudate Type: Serosanguineous Exudate Color: red, brown Foul Odor After Cleansing: No Slough/Fibrino No Wound Bed Granulation Amount: Large (67-100%) Exposed Structure Necrotic Amount: Small (1-33%) Fascia Exposed: No Necrotic Quality: Adherent Slough Fat Layer (Subcutaneous Tissue) Exposed: Yes Tendon Exposed: No Muscle Exposed: No Joint Exposed: No Bone  Exposed: No Periwound Skin Texture Texture Color No Abnormalities Noted: No No Abnormalities Noted: No Callus: No Atrophie Blanche: No Crepitus: No Cyanosis: No Excoriation: No Ecchymosis: No Induration: No Erythema: No Rash: No Hemosiderin Staining: No Scarring: No Mottled: No Pallor: No Moisture Rubor: No No Abnormalities Noted: No Dry / Scaly: No Maceration: No Treatment Notes Wound #2 (Ankle) Wound Laterality: Right, Medial Cleanser Soap and Water Discharge Instruction: May shower and wash wound with dial antibacterial soap and water prior to dressing change. Vashe 5.8 (oz) Discharge Instruction: Cleanse the wound with Vashe prior to applying a clean dressing using gauze sponges, not tissue or cotton balls. [place], [name] ([id number]) [id number] Page 8 of 8 Peri-Wound Care Triamcinolone 15 (g) Discharge Instruction: Use triamcinolone 15 (g) as directed Sween Lotion (Moisturizing lotion) Discharge Instruction: Apply moisturizing lotion as directed Topical Primary Dressing Promogran Prisma Matrix, 4.34 (sq in) (silver collagen) Discharge Instruction: Moisten collagen with saline or hydrogel Secondary Dressing ABD Pad, 8x10 Discharge Instruction: Apply over primary dressing as directed. Secured With Compression Wrap Kerlix Roll 4.5x3.1 (in/yd) Discharge Instruction: Apply Kerlix and Coban compression as directed. Coban Self-Adherent Wrap 4x5 (in/yd) Discharge Instruction: Apply over Kerlix as directed. Compression Stockings Add-Ons Electronic Signature(s) Signed: 02/04/2023 6:06:14 PM By: Shawn Stall RN, BSN Entered By: Shawn Stall on 02/04/2023 16:07:13 -------------------------------------------------------------------------------- Vitals Details Patient Name: Date of Service: [name], [place] RNA 02/04/2023 3:45 PM Medical Record Number: [id number] Patient Account Number: [id number] Date of Birth/Sex: Treating RN: Dec 19, 1923 (89  y.o. Arta Silence Primary Care Oracio Galen: Ardean Larsen Other Clinician: Referring Tarshia Kot: Treating Sande Pickert/Extender: [name], [name] in Treatment: 11 Vital Signs Time Taken: 16:08 Temperature (F): 98.3 Height (in): 62 Pulse (bpm): 75 Weight (lbs): 120 Respiratory Rate (breaths/min): 16 Body Mass  Index (BMI): 21.9 Blood Pressure (mmHg): 172/73 Reference Range: 80 - 120 mg / dl Electronic Signature(s) Signed: 02/04/2023 6:06:14 PM By: Shawn Stall RN, BSN Entered By: Shawn Stall on 02/04/2023 16:08:00

## 2023-02-05 NOTE — Progress Notes (Signed)
[name] ([id number]) [id number] Page 1 of 7 Visit Report for 02/04/2023 Chief Complaint Document Details Patient Name: Date of Service: [name] RNA 02/04/2023 3:45 PM Medical Record Number: [id number] Patient Account Number: [id number] Date of Birth/Sex: Treating RN: 06-01-1924 (89 y.o. F) Primary Care Provider: Ardean Larsen Other Clinician: Referring Provider: Treating Provider/Extender: [name], [name] in Treatment: 11 Information Obtained from: Patient Chief Complaint 11/13/2022; patient is here for review of wounds on her right lateral and right medial ankle Electronic Signature(s) Signed: 02/04/2023 5:19:21 PM By: Geralyn Corwin DO Entered By: Geralyn Corwin on 02/04/2023 16:19:46 -------------------------------------------------------------------------------- HPI Details Patient Name: Date of Service: [name], [name] RNA 02/04/2023 3:45 PM Medical Record Number: [id number] Patient Account Number: [id number] Date of Birth/Sex: Treating RN: 1924-04-22 (89 y.o. F) Primary Care Provider: Ardean Larsen Other Clinician: Referring Provider: Treating Provider/Extender: [name], [name] in Treatment: 11 History of Present Illness HPI Description: ADMISSION 11/12/2021 This is a 89 year old independent woman who arrives for review of wounds on her lateral and medial ankle. It is a bit difficult to get a precise history here but it is quite clear that these have been present for many months. She states that she normally wore stockings however the wound laterally started to drain too much so she has not been wearing them. She has developed a circular erythematous area around the wounds on the lateral malleolus and her primary care doctor has given her samples of Nuzyra to use for cellulitis. Past medical history is actually very limited to hypertension, hearing loss. She had a rectal resection for cancer in  2008. In 2023 she had a small bowel obstruction. She also says she has a history of cellulitis of both legs ABIs in our clinic were difficult to obtain at 0.61 bilaterally 5/16; patient presents for follow-up. We have been using Hydrofera Blue under Kerlix/Coban to the right lower extremity. She is scheduled to have her ABIs done next week. She has no issues or complaints. She tolerated the wrap well. 5/28; patient presents for follow-up. We have been using Hydrofera Blue under Kerlix/Coban to the right lower extremity wounds. She took the wraps off to have her ABIs and TBI's completed. She has been without the wrap or dressings for the past 5 days. She has irritation to the right lateral leg. Her ABIs were noted to be 0.96 on the right with monophasic waveforms throughout and a TBI of 0.3. 6/4; right medial malleolus and just behind the right lateral malleolus. We have been using topical antibiotics and Hydrofera Blue under kerlix Coban, can compression. There was a referral I think placed to vein and vascular for follow-up of her arterial studies 6/11; patient presents for follow-up. We have been using antibiotic ointment and Hydrofera Blue under Kerlix/Coban to the right lateral and medial feet wounds. She still has not heard from vein and vascular for follow-up of arterial study results. 6/24; patient presents for follow-up. We have been using antibiotic ointment and Hydrofera Blue under Kerlix/Coban to the feet wounds. The right lateral foot wound has healed. She has been contacted by vein and vascular however patient does not want to follow-up. 7/16; patient presents for follow-up. We have been using antibiotic ointment with Hydrofera Blue under Kerlix/Coban. The medial right ankle wound is smaller. [name] ([id number]) [id number] Page 2 of 7 7/22; patient presents for follow-up. We have been using antibiotic ointment with Hydrofera Blue under Kerlix/Coban to  the medial right ankle wound. This wound is smaller. Patient  has no issues or complaints today. 7/29; patient presents for follow-up. We have been using antibiotic ointment with Hydrofera Blue under Kerlix/Coban to the medial right ankle wound. Wound is much smaller today. She has no issues or complaints. Electronic Signature(s) Signed: 02/04/2023 5:19:21 PM By: Geralyn Corwin DO Entered By: Geralyn Corwin on 02/04/2023 16:20:10 -------------------------------------------------------------------------------- Physical Exam Details Patient Name: Date of ServiceTorrie Faulkner RNA 02/04/2023 3:45 PM Medical Record Number: [id number] Patient Account Number: [id number] Date of Birth/Sex: Treating RN: 07-28-1923 (89 y.o. F) Primary Care Provider: Ardean Larsen Other Clinician: Referring Provider: Treating Provider/Extender: [name], [name] in Treatment: 11 Constitutional respirations regular, non-labored and within target range for patient.. Cardiovascular 2+ dorsalis pedis/posterior tibialis pulses. Psychiatric pleasant and cooperative. Notes Small open wound to the right medial malleolus with granulation tissue. No signs of infection. Significant venous stasis dermatitis and varicosities throughout the leg. Good edema control. Electronic Signature(s) Signed: 02/04/2023 5:19:21 PM By: Geralyn Corwin DO Entered By: Geralyn Corwin on 02/04/2023 16:20:58 -------------------------------------------------------------------------------- Physician Orders Details Patient Name: Date of Service: [name], [name] RNA 02/04/2023 3:45 PM Medical Record Number: [id number] Patient Account Number: [id number] Date of Birth/Sex: Treating RN: 1924-01-01 (89 y.o. [name] Primary Care Provider: Ardean Larsen Other Clinician: Referring Provider: Treating Provider/Extender: [name]: 41 Verbal / Phone Orders: No Diagnosis  Coding Follow-up Appointments ppointment in 1 week. - Dr. Mikey Bussing Room 9 Return A Other: - Vein and Vascular appt 02/13/2023 ensure you go. Anesthetic (In clinic) Topical Lidocaine 4% applied to wound bed Bathing/ Shower/ Hygiene May shower with protection but do not get wound dressing(s) wet. Protect dressing(s) with water repellant cover (for example, large plastic bag) or a cast cover and may then take shower. [name] ([id number]) [id number] Page 3 of 7 Edema Control - Lymphedema / SCD / Other Elevate legs to the level of the heart or above for 30 minutes daily and/or when sitting for 3-4 times a day throughout the day. Avoid standing for long periods of time. Exercise regularly Wound Treatment Wound #2 - Ankle Wound Laterality: Right, Medial Cleanser: Soap and Water 1 x Per Week/30 Days Discharge Instructions: May shower and wash wound with dial antibacterial soap and water prior to dressing change. Cleanser: Vashe 5.8 (oz) 1 x Per Week/30 Days Discharge Instructions: Cleanse the wound with Vashe prior to applying a clean dressing using gauze sponges, not tissue or cotton balls. Peri-Wound Care: Triamcinolone 15 (g) 1 x Per Week/30 Days Discharge Instructions: Use triamcinolone 15 (g) as directed Peri-Wound Care: Sween Lotion (Moisturizing lotion) 1 x Per Week/30 Days Discharge Instructions: Apply moisturizing lotion as directed Prim Dressing: Promogran Prisma Matrix, 4.34 (sq in) (silver collagen) 1 x Per Week/30 Days ary Discharge Instructions: Moisten collagen with saline or hydrogel Secondary Dressing: ABD Pad, 8x10 1 x Per Week/30 Days Discharge Instructions: Apply over primary dressing as directed. Compression Wrap: Kerlix Roll 4.5x3.1 (in/yd) 1 x Per Week/30 Days Discharge Instructions: Apply Kerlix and Coban compression as directed. Compression Wrap: Coban Self-Adherent Wrap 4x5 (in/yd) 1 x Per Week/30 Days Discharge Instructions: Apply  over Kerlix as directed. Patient Medications llergies: Levaquin A Notifications Medication Indication Start End 02/04/2023 lidocaine DOSE topical 4 % cream - cream topical once daily Electronic Signature(s) Signed: 02/04/2023 5:19:21 PM By: Geralyn Corwin DO Entered By: Geralyn Corwin on 02/04/2023 16:21:06 -------------------------------------------------------------------------------- Problem List Details Patient Name: Date of Service: [name], [name] RNA 02/04/2023 3:45 PM Medical Record Number: [id number] Patient Account Number: [id number] Date  of Birth/Sex: Treating RN: 27-Jan-1924 (89 y.o. 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 F) Primary Care Provider: Ardean Larsen Other Clinician: Referring Provider: Treating Provider/Extender: [name], [name] in Treatment:  11 Subjective Chief Complaint Information obtained from Patient 11/13/2022; patient is here for review of wounds on her right lateral and right medial ankle History of Present Illness (HPI) ADMISSION 11/12/2021 This is a 89 year old independent woman who arrives for review of wounds on her lateral and medial ankle. It is a bit difficult to get a precise history here but it is quite clear that these have been present for many months. She states that she normally wore stockings however the wound laterally started to drain too much so she has not been wearing them. She has developed a circular erythematous area around the wounds on the lateral malleolus and her primary care doctor has given her samples of Nuzyra to use for cellulitis. Past medical history is actually very limited to hypertension, hearing loss. She had a rectal resection for cancer in 2008. In 2023 she had a small bowel obstruction. She also says she has a history of cellulitis of both legs ABIs in our clinic were difficult to obtain at 0.61 bilaterally 5/16; patient presents for follow-up. We have been using Hydrofera Blue under Kerlix/Coban to the right lower extremity. She is scheduled to have her ABIs done next week. She has no issues or complaints. She tolerated the wrap well. 5/28; patient presents for follow-up. We have been using Hydrofera Blue under Kerlix/Coban to the right lower extremity wounds. She took the wraps off to have her ABIs and TBI's completed. She has been without the wrap or dressings for the past 5 days. She has irritation to the right lateral leg. Her ABIs were noted to be 0.96 on the right with monophasic waveforms throughout and a TBI of 0.3. 6/4; right medial malleolus and just behind the right lateral malleolus. We have been using topical antibiotics and Hydrofera Blue under kerlix Coban, can compression. There was a referral I think placed to vein and vascular for follow-up of her arterial studies 6/11;  patient presents for follow-up. We have been using antibiotic ointment and Hydrofera Blue under Kerlix/Coban to the right lateral and medial feet wounds. She still has not heard from vein and vascular for follow-up of arterial study results. 6/24; patient presents for follow-up. We have been using antibiotic ointment and Hydrofera Blue under Kerlix/Coban to the feet wounds. The right lateral foot wound has healed. She has been contacted by vein and vascular however patient does not want to follow-up. 7/16; patient presents for follow-up. We have been using antibiotic ointment with Hydrofera Blue under Kerlix/Coban. The medial right  ankle wound is smaller. 7/22; patient presents for follow-up. We have been using antibiotic ointment with Hydrofera Blue under Kerlix/Coban to the medial right ankle wound. This wound is smaller. Patient has no issues or complaints today. 7/29; patient presents for follow-up. We have been using antibiotic ointment with Hydrofera Blue under Kerlix/Coban to the medial right ankle wound. Wound is much smaller today. She has no issues or complaints. Patient History [name], [name] ([id number]) [id number] Page 5 of 7 Social History Never smoker, Alcohol Use - Never, Drug Use - No History, Caffeine Use - Never. Medical History Respiratory Patient has history of Asthma Cardiovascular Patient has history of Hypertension Oncologic Denies history of Received Chemotherapy, Received Radiation Hospitalization/Surgery History - 2023 SBO, inguinal hernia, gangrene, FTT- bowel resection. - tonsillectomy and adenoidectomy. Medical A Surgical History Notes nd Cardiovascular aortic stenosis Gastrointestinal rectal Ca Integumentary (Skin) left lower leg cellulitis Oncologic 2008 rectal Ca Objective Constitutional respirations regular, non-labored and within target range for patient.. Vitals Time Taken: 4:08 PM, Height: 62 in, Weight: 120 lbs, BMI:  21.9, Temperature: 98.3 F, Pulse: 75 bpm, Respiratory Rate: 16 breaths/min, Blood Pressure: 172/73 mmHg. Cardiovascular 2+ dorsalis pedis/posterior tibialis pulses. Psychiatric pleasant and cooperative. General Notes: Small open wound to the right medial malleolus with granulation tissue. No signs of infection. Significant venous stasis dermatitis and varicosities throughout the leg. Good edema control. Integumentary (Hair, Skin) Wound #2 status is Open. Original cause of wound was Gradually Appeared. The date acquired was: 06/08/2022. The wound has been in treatment 11 weeks. The wound is located on the Right,Medial Ankle. The wound measures 0.2cm length x 0.3cm width x 0.1cm depth; 0.047cm^2 area and 0.005cm^3 volume. There is Fat Layer (Subcutaneous Tissue) exposed. There is no tunneling or undermining noted. There is a medium amount of serosanguineous drainage noted. The wound margin is distinct with the outline attached to the wound base. There is large (67-100%) granulation within the wound bed. There is a small (1-33%) amount of necrotic tissue within the wound bed including Adherent Slough. The periwound skin appearance did not exhibit: Callus, Crepitus, Excoriation, Induration, Rash, Scarring, Dry/Scaly, Maceration, Atrophie Blanche, Cyanosis, Ecchymosis, Hemosiderin Staining, Mottled, Pallor, Rubor, Erythema. 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 I recommended switching the dressing to collagen and continuing Kerlix/Coban. Follow-up in 1 week. Plan Follow-up Appointments: Return Appointment in 1 week. - Dr. Mikey Bussing Room 9 Other: - Vein and Vascular appt 02/13/2023 ensure you  go. Anesthetic: (In clinic) Topical Lidocaine 4% applied to wound bed Bathing/ Shower/ Hygiene: [name], [name] ([id number]) [id number] Page 6 of 7 May shower with protection but do not get wound dressing(s) wet. Protect dressing(s) with water repellant cover (for example, large plastic bag) or a cast cover and may then take shower. Edema Control - Lymphedema / SCD / Other: Elevate legs to the level of the heart or above for 30 minutes daily and/or when sitting for 3-4 times a day throughout the day. Avoid standing for long periods of time. Exercise regularly The following medication(s) was prescribed: lidocaine topical 4 % cream cream topical once daily was prescribed at facility WOUND #2: - Ankle Wound Laterality: Right, Medial Cleanser: Soap and Water 1 x Per Week/30 Days Discharge Instructions: May shower and wash wound with dial antibacterial soap and water prior to dressing  change. Cleanser: Vashe 5.8 (oz) 1 x Per Week/30 Days Discharge Instructions: Cleanse the wound with Vashe prior to applying a clean dressing using gauze sponges, not tissue or cotton balls. Peri-Wound Care: Triamcinolone 15 (g) 1 x Per Week/30 Days Discharge Instructions: Use triamcinolone 15 (g) as directed Peri-Wound Care: Sween Lotion (Moisturizing lotion) 1 x Per Week/30 Days Discharge Instructions: Apply moisturizing lotion as directed Prim Dressing: Promogran Prisma Matrix, 4.34 (sq in) (silver collagen) 1 x Per Week/30 Days ary Discharge Instructions: Moisten collagen with saline or hydrogel Secondary Dressing: ABD Pad, 8x10 1 x Per Week/30 Days Discharge Instructions: Apply over primary dressing as directed. Com pression Wrap: Kerlix Roll 4.5x3.1 (in/yd) 1 x Per Week/30 Days Discharge Instructions: Apply Kerlix and Coban compression as directed. Com pression Wrap: Coban Self-Adherent Wrap 4x5 (in/yd) 1 x Per Week/30 Days Discharge Instructions: Apply over Kerlix as  directed. 1. Collagen under Kerlix/Cobanleft lower extremity 2. Follow-up in 1 week Electronic Signature(s) Signed: 02/04/2023 5:19:21 PM By: Geralyn Corwin DO Entered By: Geralyn Corwin on 02/04/2023 16:21:42 -------------------------------------------------------------------------------- HxROS Details Patient Name: Date of Service: [name], [name] RNA 02/04/2023 3:45 PM Medical Record Number: [id number] Patient Account Number: [id number] Date of Birth/Sex: Treating RN: 1923-11-18 (89 y.o. F) Primary Care Provider: Ardean Larsen Other Clinician: Referring Provider: Treating Provider/Extender: [name], [name] in Treatment: 11 Respiratory Medical History: Positive for: Asthma Cardiovascular Medical History: Positive for: Hypertension Past Medical History Notes: aortic stenosis Gastrointestinal Medical History: Past Medical History Notes: rectal Ca Integumentary (Skin) Medical History: Past Medical History Notes: left lower leg cellulitis Oncologic Medical History: Negative for: Received Chemotherapy; Received Radiation Past Medical History NotesMarland Kitchen [name], [name] ([id number]) [id number] Page 7 of 7 2008 rectal Ca Immunizations Pneumococcal Vaccine: Received Pneumococcal Vaccination: No Implantable Devices No devices added Hospitalization / Surgery History Type of Hospitalization/Surgery 2023 SBO, inguinal hernia, gangrene, FTT- bowel resection tonsillectomy and adenoidectomy Family and Social History Never smoker; Alcohol Use: Never; Drug Use: No History; Caffeine Use: Never; Financial Concerns: No; Food, Clothing or Shelter Needs: No; Support System Lacking: No; Transportation Concerns: No Electronic Signature(s) Signed: 02/04/2023 5:19:21 PM By: Geralyn Corwin DO Entered By: Geralyn Corwin on 02/04/2023 16:20:29 -------------------------------------------------------------------------------- SuperBill  Details Patient Name: Date of Service: [name], [name] RNA 02/04/2023 Medical Record Number: [id number] Patient Account Number: [id number] Date of Birth/Sex: Treating RN: 1923-12-11 (89 y.o. 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 I10 Essential (primary) hypertension Facility Procedures : CPT4 Code: [id number] Description: 99213 - WOUND CARE VISIT-LEV 3 EST PT Modifier: Quantity: 1 Physician Procedures : CPT4 Code Description Modifier [id number] - WC PHYS LEVEL 3 - EST PT ICD-10 Diagnosis Description L97.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

## 2023-02-12 ENCOUNTER — Encounter (HOSPITAL_BASED_OUTPATIENT_CLINIC_OR_DEPARTMENT_OTHER): Payer: 59 | Attending: Internal Medicine | Admitting: Internal Medicine

## 2023-02-12 DIAGNOSIS — I70233 Atherosclerosis of native arteries of right leg with ulceration of ankle: Secondary | ICD-10-CM | POA: Insufficient documentation

## 2023-02-12 DIAGNOSIS — I10 Essential (primary) hypertension: Secondary | ICD-10-CM | POA: Diagnosis not present

## 2023-02-12 DIAGNOSIS — J45909 Unspecified asthma, uncomplicated: Secondary | ICD-10-CM | POA: Insufficient documentation

## 2023-02-12 DIAGNOSIS — Z85048 Personal history of other malignant neoplasm of rectum, rectosigmoid junction, and anus: Secondary | ICD-10-CM | POA: Diagnosis not present

## 2023-02-12 DIAGNOSIS — L97311 Non-pressure chronic ulcer of right ankle limited to breakdown of skin: Secondary | ICD-10-CM | POA: Insufficient documentation

## 2023-02-12 DIAGNOSIS — I87331 Chronic venous hypertension (idiopathic) with ulcer and inflammation of right lower extremity: Secondary | ICD-10-CM | POA: Insufficient documentation

## 2023-02-12 DIAGNOSIS — I872 Venous insufficiency (chronic) (peripheral): Secondary | ICD-10-CM | POA: Diagnosis not present

## 2023-02-13 ENCOUNTER — Encounter: Payer: Self-pay | Admitting: Vascular Surgery

## 2023-02-13 ENCOUNTER — Ambulatory Visit (INDEPENDENT_AMBULATORY_CARE_PROVIDER_SITE_OTHER): Payer: 59 | Admitting: Vascular Surgery

## 2023-02-13 VITALS — BP 169/97 | HR 78 | Temp 97.7°F | Resp 20 | Ht 64.0 in | Wt 120.0 lb

## 2023-02-13 DIAGNOSIS — S91009A Unspecified open wound, unspecified ankle, initial encounter: Secondary | ICD-10-CM | POA: Diagnosis not present

## 2023-02-13 NOTE — Progress Notes (Signed)
Office Note     CC: Right ankle wound Requesting Provider:  Ollen Bowl, MD  HPI: [name] is a 89 y.o. (12-11-1923) female presenting at the request of .Pahwani, Kasandra Knudsen, MD for evaluation of right ankle wound.  On exam today, [name] was doing well, accompanied by her daughter, who is also named [name].  A native of [place], she moved to [place] decades ago.  She is a Buyer, retail of [employer].  Quinlynn has had wounds to the right ankle which were first appreciated and treated at the wound care center roughly 11 weeks ago.  Since that time, healing has been slow, but steady.  Per [name] and her daughter, the lateral wound has healed completely, and the medial wound is nearly healed.  She denies drainage, odor, erythema, induration.  [name] continues to live an active lifestyle, ambulating and even driving at the age of 89 years old.  She is excited for her 89th birthday.  The pt is not on a statin for cholesterol management.  The pt is not on a daily aspirin.   Other AC:  - The pt is on medication for hypertension.   The pt is not diabetic.  Tobacco hx:  -  11weeks  Past Medical History:  Diagnosis Date   Asthma    Cancer (HCC)    rectal ca no chemo/radiation   Chicken pox    History of colon polyps    Rectal cancer (HCC) 01/15/2007   LAR resection with low anastomosis - Dr Corliss Skains.  pT3pN0 = Stage II    Past Surgical History:  Procedure Laterality Date   LOW ANTERIOR BOWEL RESECTION  01/15/2007   Dr Corliss Skains - 29EEA anastomsis 3cm from anal verge - pT3pN0 cancer   TONSILLECTOMY AND ADENOIDECTOMY      Social History   Socioeconomic History   Marital status: Widowed    Spouse name: Not on file   Number of children: Not on file   Years of education: Not on file   Highest education level: Not on file  Occupational History   Not on file  Tobacco Use   Smoking status: Never   Smokeless tobacco: Never  Substance and Sexual Activity    Alcohol use: No   Drug use: No   Sexual activity: Never  Other Topics Concern   Not on file  Social History Narrative   Not on file   Social Determinants of Health   Financial Resource Strain: Not on file  Food Insecurity: No Food Insecurity (07/04/2022)   Hunger Vital Sign    Worried About Running Out of Food in the Last Year: Never true    Ran Out of Food in the Last Year: Never true  Transportation Needs: No Transportation Needs (07/04/2022)   PRAPARE - Administrator, Civil Service (Medical): No    Lack of Transportation (Non-Medical): No  Physical Activity: Not on file  Stress: Not on file  Social Connections: Not on file  Intimate Partner Violence: Not At Risk (07/04/2022)   Humiliation, Afraid, Rape, and Kick questionnaire    Fear of Current or Ex-Partner: No    Emotionally Abused: No    Physically Abused: No    Sexually Abused: No   Family History  Problem Relation Age of Onset   Cancer Other        colon   Heart disease Other     Current Outpatient Medications  Medication Sig Dispense Refill   amLODipine (  NORVASC) 2.5 MG tablet Take 1 tablet (2.5 mg total) by mouth daily. 30 tablet 0   cholecalciferol (VITAMIN D3) 25 MCG (1000 UNIT) tablet Take 1,000 Units by mouth daily.     hydrochlorothiazide (MICROZIDE) 12.5 MG capsule TAKE 1 CAPSULE BY MOUTH EVERY DAY IN THE MORNING FOR 90 DAYS     No current facility-administered medications for this visit.    Allergies  Allergen Reactions   Levofloxacin     REACTION: Got very hot and turned red all over     REVIEW OF SYSTEMS:  [X]  denotes positive finding, [ ]  denotes negative finding Cardiac  Comments:  Chest pain or chest pressure:    Shortness of breath upon exertion:    Short of breath when lying flat:    Irregular heart rhythm:        Vascular    Pain in calf, thigh, or hip brought on by ambulation:    Pain in feet at night that wakes you up from your sleep:     Blood clot in your veins:     Leg swelling:         Pulmonary    Oxygen at home:    Productive cough:     Wheezing:         Neurologic    Sudden weakness in arms or legs:     Sudden numbness in arms or legs:     Sudden onset of difficulty speaking or slurred speech:    Temporary loss of vision in one eye:     Problems with dizziness:         Gastrointestinal    Blood in stool:     Vomited blood:         Genitourinary    Burning when urinating:     Blood in urine:        Psychiatric    Major depression:         Hematologic    Bleeding problems:    Problems with blood clotting too easily:        Skin    Rashes or ulcers:        Constitutional    Fever or chills:      PHYSICAL EXAMINATION:  There were no vitals filed for this visit.  General:  WDWN in NAD; vital signs documented above Gait: Not observed HENT: WNL, normocephalic Pulmonary: normal non-labored breathing , without wheezing Cardiac: regular HR Abdomen: soft, NT, no masses Skin: with rashes Vascular Exam/Pulses:  Right Left  Radial 2+ (normal) 2+ (normal)  Ulnar    Femoral    Popliteal    DP nonpalpable nonpalpable  PT nonpalpable nonpalpable   Extremities: without ischemic changes, without Gangrene , without cellulitis;     Musculoskeletal: no muscle wasting or atrophy  Neurologic: A&O X 3;  No focal weakness or paresthesias are detected Psychiatric:  The pt has Normal affect.   Non-Invasive Vascular Imaging:   ABI Findings:  +---------+------------------+-----+----------+--------+  Right   Rt Pressure (mmHg)IndexWaveform  Comment   +---------+------------------+-----+----------+--------+  Brachial 162                                        +---------+------------------+-----+----------+--------+  PTA     155               0.96 monophasicbrisk     +---------+------------------+-----+----------+--------+  DP  137               0.85 monophasicbrisk      +---------+------------------+-----+----------+--------+  Great Toe49                0.30 Abnormal            +---------+------------------+-----+----------+--------+   +---------+------------------+-----+-----------+-------+  Left    Lt Pressure (mmHg)IndexWaveform   Comment  +---------+------------------+-----+-----------+-------+  Brachial 160                                        +---------+------------------+-----+-----------+-------+  PTA     202               1.25 multiphasic         +---------+------------------+-----+-----------+-------+  DP      165               1.02 multiphasic         +---------+------------------+-----+-----------+-------+  Great Toe78                0.48 Abnormal            +---------+------------------+-----+-----------+-------+     ASSESSMENT/PLAN: Zahniya Greenwalt is a 89 y.o. female presenting with slow healing wound on the right medial malleolus.    Fortunately, the wound is healing.  Both her and her daughter are happy with the progress, and were not interested in any procedure  ABI was reviewed demonstrating peripheral arterial disease bilaterally with microvascular disease in the feet.  I am happy to hear that the wound is healing slowly.  The wound location is most commonly associated with venous insufficiency, which is best treated with wound care and compression. Since the wound is healing, I asked that they continue current wound care regimen with no changes.  No indication for revascularization at this time.  I asked them to call my office should wound healing stagnate or deteriorate.  Dalia can follow-up with my office as needed.   Victorino Sparrow, MD [facility] [phone]

## 2023-02-18 ENCOUNTER — Encounter (HOSPITAL_BASED_OUTPATIENT_CLINIC_OR_DEPARTMENT_OTHER): Payer: 59 | Admitting: Internal Medicine

## 2023-02-18 DIAGNOSIS — I70233 Atherosclerosis of native arteries of right leg with ulceration of ankle: Secondary | ICD-10-CM | POA: Diagnosis not present

## 2023-02-18 DIAGNOSIS — I10 Essential (primary) hypertension: Secondary | ICD-10-CM

## 2023-02-18 DIAGNOSIS — I87331 Chronic venous hypertension (idiopathic) with ulcer and inflammation of right lower extremity: Secondary | ICD-10-CM | POA: Diagnosis not present

## 2023-02-18 DIAGNOSIS — L97311 Non-pressure chronic ulcer of right ankle limited to breakdown of skin: Secondary | ICD-10-CM

## 2023-02-18 NOTE — Progress Notes (Signed)
[name] ([id number]) [id number] Page 1 of 8 Visit Report for 02/18/2023 Chief Complaint Document Details Patient Name: Date of Service: [name], [name] RNA 02/18/2023 3:30 PM Medical Record Number: [id number] Patient Account Number: [id number] Date of Birth/Sex: Treating RN: 1924-01-13 (89 y.o. F) Primary Care Provider: Ardean Larsen Other Clinician: Referring Provider: Treating Provider/Extender: [name], [name] in Treatment: 13 Information Obtained from: Patient Chief Complaint 11/13/2022; patient is here for review of wounds on her right lateral and right medial ankle Electronic Signature(s) Signed: 02/18/2023 4:52:09 PM By: Geralyn Corwin DO Entered By: Geralyn Corwin on 02/18/2023 16:30:04 -------------------------------------------------------------------------------- HPI Details Patient Name: Date of Service: [name], [place] RNA 02/18/2023 3:30 PM Medical Record Number: [id number] Patient Account Number: [id number] Date of Birth/Sex: Treating RN: 10-23-1923 (89 y.o. F) Primary Care Provider: Ardean Larsen Other Clinician: Referring Provider: Treating Provider/Extender: [name], [name] in Treatment: 13 History of Present Illness HPI Description: ADMISSION 11/12/2021 This is a 89 year old independent woman who arrives for review of wounds on her lateral and medial ankle. It is a bit difficult to get a precise history here but it is quite clear that these have been present for many months. She states that she normally wore stockings however the wound laterally started to drain too much so she has not been wearing them. She has developed a circular erythematous area around the wounds on the lateral malleolus and her primary care doctor has given her samples of Nuzyra to use for cellulitis. Past medical history is actually very limited to hypertension, hearing loss. She had a rectal resection for cancer in  2008. In 2023 she had a small bowel obstruction. She also says she has a history of cellulitis of both legs ABIs in our clinic were difficult to obtain at 0.61 bilaterally 5/16; patient presents for follow-up. We have been using Hydrofera Blue under Kerlix/Coban to the right lower extremity. She is scheduled to have her ABIs done next week. She has no issues or complaints. She tolerated the wrap well. 5/28; patient presents for follow-up. We have been using Hydrofera Blue under Kerlix/Coban to the right lower extremity wounds. She took the wraps off to have her ABIs and TBI's completed. She has been without the wrap or dressings for the past 5 days. She has irritation to the right lateral leg. Her ABIs were noted to be 0.96 on the right with monophasic waveforms throughout and a TBI of 0.3. 6/4; right medial malleolus and just behind the right lateral malleolus. We have been using topical antibiotics and Hydrofera Blue under kerlix Coban, can compression. There was a referral I think placed to vein and vascular for follow-up of her arterial studies 6/11; patient presents for follow-up. We have been using antibiotic ointment and Hydrofera Blue under Kerlix/Coban to the right lateral and medial feet wounds. She still has not heard from vein and vascular for follow-up of arterial study results. 6/24; patient presents for follow-up. We have been using antibiotic ointment and Hydrofera Blue under Kerlix/Coban to the feet wounds. The right lateral foot wound has healed. She has been contacted by vein and vascular however patient does not want to follow-up. 7/16; patient presents for follow-up. We have been using antibiotic ointment with Hydrofera Blue under Kerlix/Coban. The medial right ankle wound is smaller. [name] ([id number]) [id number] Page 2 of 8 7/22; patient presents for follow-up. We have been using antibiotic ointment with Hydrofera Blue under Kerlix/Coban to  the medial right ankle wound. This wound is smaller. Patient  has no issues or complaints today. 7/29; patient presents for follow-up. We have been using antibiotic ointment with Hydrofera Blue under Kerlix/Coban to the medial right ankle wound. Wound is much smaller today. She has no issues or complaints. 8/6; patient presents for follow-up. We have been using collagen to the right medial ankle wound under compression wrap. Wound is smaller. She is scheduled to see vein and vascular tomorrow due to results of ABIs. 8/12; patient presents for follow-up. She saw vein and vascular on 8/7 and no need for revascularization. Recommended continuing with current wound care. Patient has no issues or complaints. We have traditionally been using collagen under her Kerlix/Coban. Electronic Signature(s) Signed: 02/18/2023 4:52:09 PM By: Geralyn Corwin DO Entered By: Geralyn Corwin on 02/18/2023 16:31:12 -------------------------------------------------------------------------------- Physical Exam Details Patient Name: Date of ServiceTorrie Mayers RNA 02/18/2023 3:30 PM Medical Record Number: [id number] Patient Account Number: [id number] Date of Birth/Sex: Treating RN: 23-Apr-1924 (89 y.o. F) Primary Care Provider: Ardean Larsen Other Clinician: Referring Provider: Treating Provider/Extender: [name], [name] in Treatment: 13 Constitutional respirations regular, non-labored and within target range for patient.. Cardiovascular 2+ dorsalis pedis/posterior tibialis pulses. Psychiatric pleasant and cooperative. Notes Small open wound to the right medial malleolus with granulation tissue. No signs of infection. Significant venous stasis dermatitis and varicosities throughout the leg. Good edema control. Electronic Signature(s) Signed: 02/18/2023 4:52:09 PM By: Geralyn Corwin DO Entered By: Geralyn Corwin on 02/18/2023  16:31:43 -------------------------------------------------------------------------------- Physician Orders Details Patient Name: Date of Service: [name], [place] RNA 02/18/2023 3:30 PM Medical Record Number: [id number] Patient Account Number: [id number] Date of Birth/Sex: Treating RN: Mar 03, 1924 (89 y.o. Fredderick Phenix Primary Care Provider: Ardean Larsen Other Clinician: Referring Provider: Treating Provider/Extender: [name]: 34 Verbal / Phone Orders: No Diagnosis Coding Follow-up Appointments ppointment in 1 week. - Dr. Mikey Bussing Room 9 Return A Anesthetic (In clinic) Topical Lidocaine 4% applied to wound bed [place], [name] ([id number]) [phone].pdf Page 3 of 8 Bathing/ Shower/ Hygiene May shower with protection but do not get wound dressing(s) wet. Protect dressing(s) with water repellant cover (for example, large plastic bag) or a cast cover and may then take shower. Edema Control - Lymphedema / SCD / Other Elevate legs to the level of the heart or above for 30 minutes daily and/or when sitting for 3-4 times a day throughout the day. Avoid standing for long periods of time. Exercise regularly Wound Treatment Wound #2 - Ankle Wound Laterality: Right, Medial Cleanser: Soap and Water 1 x Per Week/30 Days Discharge Instructions: May shower and wash wound with dial antibacterial soap and water prior to dressing change. Cleanser: Vashe 5.8 (oz) 1 x Per Week/30 Days Discharge Instructions: Cleanse the wound with Vashe prior to applying a clean dressing using gauze sponges, not tissue or cotton balls. Peri-Wound Care: Triamcinolone 15 (g) 1 x Per Week/30 Days Discharge Instructions: Use triamcinolone 15 (g) as directed Peri-Wound Care: Sween Lotion (Moisturizing lotion) 1 x Per Week/30 Days Discharge Instructions: Apply moisturizing lotion as directed Prim Dressing: Promogran Prisma Matrix, 4.34 (sq in) (silver  collagen) 1 x Per Week/30 Days ary Discharge Instructions: Moisten collagen with saline or hydrogel Secondary Dressing: Woven Gauze Sponge, Non-Sterile 4x4 in 1 x Per Week/30 Days Discharge Instructions: Apply over primary dressing as directed. Compression Wrap: Kerlix Roll 4.5x3.1 (in/yd) 1 x Per Week/30 Days Discharge Instructions: Apply Kerlix and Coban compression as directed. Compression Wrap: Coban Self-Adherent Wrap 4x5 (in/yd) 1 x Per Week/30 Days Discharge Instructions: Apply over  Kerlix as directed. Patient Medications llergies: Levaquin A Notifications Medication Indication Start End 02/18/2023 lidocaine DOSE topical 4 % cream - cream topical Electronic Signature(s) Signed: 02/18/2023 4:52:09 PM By: Geralyn Corwin DO Entered By: Geralyn Corwin on 02/18/2023 16:31:53 -------------------------------------------------------------------------------- Problem List Details Patient Name: Date of Service: [name], [place] RNA 02/18/2023 3:30 PM Medical Record Number: [id number] Patient Account Number: [id number] Date of Birth/Sex: Treating RN: 03/11/1924 (89 y.o. 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 F) Primary Care Provider: Ardean Larsen Other Clinician: Referring Provider: Treating Provider/Extender: [name], [name] in Treatment: 13 Subjective Chief Complaint Information obtained from Patient 11/13/2022; patient is here for review of wounds on her right lateral and right medial ankle History of Present Illness (HPI) ADMISSION 11/12/2021 This is a 89 year old independent woman who arrives for review of wounds on her lateral and medial ankle. It is a bit difficult to get a precise history here but it is quite clear that these have been present for many months. She states that she normally wore stockings however the wound laterally started to drain too much so she has not been wearing them. She has developed a circular erythematous area around the wounds on the lateral malleolus and her primary care doctor has given her samples of Nuzyra to use for cellulitis. Past medical history is actually very limited to hypertension, hearing loss. She had a rectal resection for cancer in 2008. In 2023 she had a small bowel obstruction. She also says she has a history of cellulitis of both legs ABIs in our clinic were difficult to obtain at 0.61 bilaterally 5/16; patient presents for follow-up. We have been using Hydrofera Blue under Kerlix/Coban to the right lower extremity. She is scheduled to have her ABIs done next week. She has no issues or complaints. She tolerated the wrap well. 5/28; patient presents for follow-up. We have been using Hydrofera Blue under Kerlix/Coban to the right lower extremity wounds. She took the wraps off to have her ABIs and TBI's completed. She  has been without the wrap or dressings for the past 5 days. She has irritation to the right lateral leg. Her ABIs were noted to be 0.96 on the right with monophasic waveforms throughout and a TBI of 0.3. 6/4; right medial malleolus and just behind the right lateral malleolus. We have been using topical antibiotics and Hydrofera Blue under kerlix Coban, can compression. There was a referral I think placed to vein and vascular for follow-up of her arterial studies 6/11; patient presents for follow-up. We have been using antibiotic ointment and Hydrofera Blue under Kerlix/Coban to the right lateral and medial feet wounds. She still has not heard from vein and vascular for follow-up  of arterial study results. 6/24; patient presents for follow-up. We have been using antibiotic ointment and Hydrofera Blue under Kerlix/Coban to the feet wounds. The right lateral foot wound has healed. She has been contacted by vein and vascular however patient does not want to follow-up. 7/16; patient presents for follow-up. We have been using antibiotic ointment with Hydrofera Blue under Kerlix/Coban. The medial right ankle wound is smaller. 7/22; patient presents for follow-up. We have been using antibiotic ointment with Hydrofera Blue under Kerlix/Coban to the medial right ankle wound. This wound is smaller. Patient has no issues or complaints today. 7/29; patient presents for follow-up. We have been using antibiotic ointment with Hydrofera Blue under Kerlix/Coban to the medial right ankle wound. Wound is ADONIA, MANGER ([id number]) [id number] Page 5 of 8 much smaller today. She has no issues or complaints. 8/6; patient presents for follow-up. We have been using collagen to the right medial ankle wound under compression wrap. Wound is smaller. She is scheduled to see vein and vascular tomorrow due to results of ABIs. 8/12; patient presents for follow-up. She saw vein and vascular on 8/7 and no  need for revascularization. Recommended continuing with current wound care. Patient has no issues or complaints. We have traditionally been using collagen under her Kerlix/Coban. Patient History Social History Never smoker, Alcohol Use - Never, Drug Use - No History, Caffeine Use - Never. Medical History Respiratory Patient has history of Asthma Cardiovascular Patient has history of Hypertension Oncologic Denies history of Received Chemotherapy, Received Radiation Hospitalization/Surgery History - 2023 SBO, inguinal hernia, gangrene, FTT- bowel resection. - tonsillectomy and adenoidectomy. Medical A Surgical History Notes nd Cardiovascular aortic stenosis Gastrointestinal rectal Ca Integumentary (Skin) left lower leg cellulitis Oncologic 2008 rectal Ca Objective Constitutional respirations regular, non-labored and within target range for patient.. Vitals Time Taken: 3:43 AM, Height: 62 in, Weight: 120 lbs, BMI: 21.9, Temperature: 98.2 F, Pulse: 87 bpm, Respiratory Rate: 18 breaths/min, Blood Pressure: 167/78 mmHg. Cardiovascular 2+ dorsalis pedis/posterior tibialis pulses. Psychiatric pleasant and cooperative. General Notes: Small open wound to the right medial malleolus with granulation tissue. No signs of infection. Significant venous stasis dermatitis and varicosities throughout the leg. Good edema control. Integumentary (Hair, Skin) Wound #2 status is Open. Original cause of wound was Gradually Appeared. The date acquired was: 06/08/2022. The wound has been in treatment 13 weeks. The wound is located on the Right,Medial Ankle. The wound measures 0.1cm length x 0.1cm width x 0.1cm depth; 0.008cm^2 area and 0.001cm^3 volume. There is Fat Layer (Subcutaneous Tissue) exposed. There is no tunneling or undermining noted. There is a small amount of serosanguineous drainage noted. The wound margin is distinct with the outline attached to the wound base. There is small (1-33%) red  granulation within the wound bed. There is a large (67-100%) amount of necrotic tissue within the wound bed including Eschar. The periwound skin appearance did not exhibit: Callus, Crepitus, Excoriation, Induration, Rash, Scarring, Dry/Scaly, Maceration, Atrophie Blanche, Cyanosis, Ecchymosis, Hemosiderin Staining, Mottled, Pallor, Rubor, Erythema. 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 I recommended continue the course with collagen under Kerlix/Coban. Follow-up in 1 week. [name] ([id number]) [id number] Page 6 of 8 Plan Follow-up Appointments: Return Appointment in 1 week. - Dr. Mikey Bussing Room 9 Anesthetic: (In clinic)  Topical Lidocaine 4% applied to wound bed Bathing/ Shower/ Hygiene: May shower with protection but do not get wound dressing(s) wet. Protect dressing(s) with water repellant cover (for example, large plastic bag) or a cast cover and may then take shower. Edema Control - Lymphedema / SCD / Other: Elevate legs to the level of the heart or above for 30 minutes daily and/or when sitting for 3-4 times a day throughout the day. Avoid standing for long periods of time. Exercise regularly The following medication(s) was prescribed: lidocaine topical 4 % cream cream topical was prescribed at facility WOUND #2: - Ankle Wound Laterality: Right, Medial Cleanser: Soap and Water 1 x Per Week/30 Days Discharge Instructions: May shower and wash wound with dial antibacterial soap and water prior to dressing change. Cleanser: Vashe 5.8 (oz) 1 x Per Week/30 Days Discharge Instructions: Cleanse the wound with Vashe prior to applying  a clean dressing using gauze sponges, not tissue or cotton balls. Peri-Wound Care: Triamcinolone 15 (g) 1 x Per Week/30 Days Discharge Instructions: Use triamcinolone 15 (g) as directed Peri-Wound Care: Sween Lotion (Moisturizing lotion) 1 x Per Week/30 Days Discharge Instructions: Apply moisturizing lotion as directed Prim Dressing: Promogran Prisma Matrix, 4.34 (sq in) (silver collagen) 1 x Per Week/30 Days ary Discharge Instructions: Moisten collagen with saline or hydrogel Secondary Dressing: Woven Gauze Sponge, Non-Sterile 4x4 in 1 x Per Week/30 Days Discharge Instructions: Apply over primary dressing as directed. Com pression Wrap: Kerlix Roll 4.5x3.1 (in/yd) 1 x Per Week/30 Days Discharge Instructions: Apply Kerlix and Coban compression as directed. Com pression Wrap: Coban Self-Adherent Wrap 4x5 (in/yd) 1 x Per Week/30 Days Discharge Instructions: Apply over Kerlix as directed. 1. Collagen under Kerlix/Cobanright lower extremity 2. Follow-up in 1 week Electronic Signature(s) Signed: 02/18/2023 4:52:09 PM By: Geralyn Corwin DO Entered By: Geralyn Corwin on 02/18/2023 16:32:28 -------------------------------------------------------------------------------- HxROS Details Patient Name: Date of Service: [name], [place] RNA 02/18/2023 3:30 PM Medical Record Number: [id number] Patient Account Number: [id number] Date of Birth/Sex: Treating RN: 02/14/1924 (89 y.o. F) Primary Care Provider: Ardean Larsen Other Clinician: Referring Provider: Treating Provider/Extender: [name], [name] in Treatment: 13 Respiratory Medical History: Positive for: Asthma Cardiovascular Medical History: Positive for: Hypertension Past Medical History Notes: aortic stenosis Gastrointestinal Medical History: Past Medical History Notes: rectal Ca Integumentary (Skin) Medical HistoryLESLIEANN, GAYDOSH ([id number]) [id number] Page 7 of 8 Past Medical  History Notes: left lower leg cellulitis Oncologic Medical History: Negative for: Received Chemotherapy; Received Radiation Past Medical History Notes: 2008 rectal Ca Immunizations Pneumococcal Vaccine: Received Pneumococcal Vaccination: No Implantable Devices No devices added Hospitalization / Surgery History Type of Hospitalization/Surgery 2023 SBO, inguinal hernia, gangrene, FTT- bowel resection tonsillectomy and adenoidectomy Family and Social History Never smoker; Alcohol Use: Never; Drug Use: No History; Caffeine Use: Never; Financial Concerns: No; Food, Clothing or Shelter Needs: No; Support System Lacking: No; Transportation Concerns: No Electronic Signature(s) Signed: 02/18/2023 4:52:09 PM By: Geralyn Corwin DO Entered By: Geralyn Corwin on 02/18/2023 16:31:24 -------------------------------------------------------------------------------- SuperBill Details Patient Name: Date of Service: [name], [place] RNA 02/18/2023 Medical Record Number: [id number] Patient Account Number: [id number] Date of Birth/Sex: Treating RN: 02/22/1924 (89 y.o. 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 I87.331 Chronic venous hypertension (idiopathic) with ulcer and inflammation of right lower extremity I70.233 Atherosclerosis of native arteries of right leg with ulceration  of ankle I10 Essential (primary) hypertension Facility Procedures : CPT4 Code: [id number] Description: 99213 - WOUND CARE VISIT-LEV 3 EST PT Modifier: Quantity: 1 Physician Procedures : CPT4 Code Description Modifier [id number] 99213 - WC PHYS LEVEL 3 - EST PT ICD-10 Diagnosis Description L97.311 Non-pressure chronic ulcer of right ankle limited to breakdown of  skin I87.331 Chronic venous hypertension (idiopathic) with ulcer and  inflammation of right lower extremity I70.233 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

## 2023-02-18 NOTE — Progress Notes (Signed)
Marble, [name] ([id number]) [id number] Page 1 of 10 Visit Report for 02/18/2023 Arrival Information Details Patient Name: Date of ServiceCorinna Lines, [name] RNA 02/18/2023 3:30 PM Medical Record Number: [id number] Patient Account Number: [id number] Date of Birth/Sex: Treating RN: July 17, 1923 (89 y.o. F) Primary Care Yareni Creps: Ardean Larsen Other Clinician: Referring Janin Kozlowski: Treating Auriella Wieand/Extender: [name], [name] in Treatment: 13 Visit Information History Since Last Visit All ordered tests and consults were completed: No Patient Arrived: Ambulatory Added or deleted any medications: No Arrival Time: 15:43 Any new allergies or adverse reactions: No Accompanied By: daughter Had a fall or experienced change in No Transfer Assistance: None activities of daily living that may affect Patient Identification Verified: Yes risk of falls: Secondary Verification Process Completed: Yes Signs or symptoms of abuse/neglect since last visito No Patient Requires Transmission-Based Precautions: No Hospitalized since last visit: No Patient Has Alerts: Yes Implantable device outside of the clinic excluding No Patient Alerts: ABI R 0.96 (11/29/22) cellular tissue based products placed in the center ABI L 1.25 (11/29/22) since last visit: Pain Present Now: No Electronic Signature(s) Signed: 02/18/2023 3:49:13 PM By: Dayton Scrape Entered By: Dayton Scrape on 02/18/2023 15:43:31 -------------------------------------------------------------------------------- Clinic Level of Care Assessment Details Patient Name: Date of ServiceTorrie Mayers RNA 02/18/2023 3:30 PM Medical Record Number: [id number] Patient Account Number: [id number] Date of Birth/Sex: Treating RN: [birth year] (89 y.o. Fredderick Phenix Primary Care Analisa Sledd: Ardean Larsen Other Clinician: Referring Audyn Dimercurio: Treating Esvin Hnat/Extender: [name]:  13 Clinic Level of Care Assessment Items TOOL 4 Quantity Score X- 1 0 Use when only an EandM is performed on FOLLOW-UP visit ASSESSMENTS - Nursing Assessment / Reassessment []  - 0 Reassessment of Co-morbidities (includes updates in patient status) X- 1 5 Reassessment of Adherence to Treatment Plan ASSESSMENTS - Wound and Skin A ssessment / Reassessment X - Simple Wound Assessment / Reassessment - one wound 1 5 []  - 0 Complex Wound Assessment / Reassessment - multiple wounds []  - 0 Dermatologic / Skin Assessment (not related to wound area) ASSESSMENTS - Focused Assessment X- 1 5 Circumferential Edema Measurements - multi extremities []  - 0 Nutritional Assessment / Counseling / Intervention [name] ([id number]) [id number] Page 2 of 10 X- 1 5 Lower Extremity Assessment (monofilament, tuning fork, pulses) []  - 0 Peripheral Arterial Disease Assessment (using hand held doppler) ASSESSMENTS - Ostomy and/or Continence Assessment and Care []  - 0 Incontinence Assessment and Management []  - 0 Ostomy Care Assessment and Management (repouching, etc.) PROCESS - Coordination of Care X - Simple Patient / Family Education for ongoing care 1 15 []  - 0 Complex (extensive) Patient / Family Education for ongoing care X- 1 10 Staff obtains Chiropractor, Records, T Results / Process Orders est []  - 0 Staff telephones HHA, Nursing Homes / Clarify orders / etc []  - 0 Routine Transfer to another Facility (non-emergent condition) []  - 0 Routine Hospital Admission (non-emergent condition) []  - 0 New Admissions / Manufacturing engineer / Ordering NPWT Apligraf, etc. , []  - 0 Emergency Hospital Admission (emergent condition) X- 1 10 Simple Discharge Coordination []  - 0 Complex (extensive) Discharge Coordination PROCESS - Special Needs []  - 0 Pediatric / Minor Patient Management []  - 0 Isolation Patient Management []  - 0 Hearing / Language / Visual special  needs []  - 0 Assessment of Community assistance (transportation, D/C planning, etc.) []  - 0 Additional assistance / Altered mentation []  - 0 Support Surface(s) Assessment (bed, cushion, seat, etc.) INTERVENTIONS - Wound Cleansing / Measurement  X - Simple Wound Cleansing - one wound 1 5 []  - 0 Complex Wound Cleansing - multiple wounds X- 1 5 Wound Imaging (photographs - any number of wounds) []  - 0 Wound Tracing (instead of photographs) X- 1 5 Simple Wound Measurement - one wound []  - 0 Complex Wound Measurement - multiple wounds INTERVENTIONS - Wound Dressings []  - 0 Small Wound Dressing one or multiple wounds X- 1 15 Medium Wound Dressing one or multiple wounds []  - 0 Large Wound Dressing one or multiple wounds X- 1 5 Application of Medications - topical []  - 0 Application of Medications - injection INTERVENTIONS - Miscellaneous []  - 0 External ear exam []  - 0 Specimen Collection (cultures, biopsies, blood, body fluids, etc.) []  - 0 Specimen(s) / Culture(s) sent or taken to Lab for analysis []  - 0 Patient Transfer (multiple staff / Nurse, adult / Similar devices) []  - 0 Simple Staple / Suture removal (25 or less) []  - 0 Complex Staple / Suture removal (26 or more) []  - 0 Hypo / Hyperglycemic Management (close monitor of Blood Glucose) [name] ([id number]) [phone].pdf Page 3 of 10 []  - 0 Ankle / Brachial Index (ABI) - do not check if billed separately X- 1 5 Vital Signs Has the patient been seen at the hospital within the last three years: Yes Total Score: 95 Level Of Care: New/Established - Level 3 Electronic Signature(s) Signed: 02/18/2023 4:53:42 PM By: Samuella Bruin Entered By: Samuella Bruin on 02/18/2023 16:22:30 -------------------------------------------------------------------------------- Encounter Discharge Information Details Patient Name: Date of Service: [name], [place] RNA 02/18/2023 3:30 PM Medical Record  Number: [id number] Patient Account Number: [id number] Date of Birth/Sex: Treating RN: [birth year] (89 y.o. Fredderick Phenix Primary Care Malgorzata Albert: Ardean Larsen Other Clinician: Referring Traniya Prichett: Treating Zekiah Caruth/Extender: [name]: 39 Encounter Discharge Information Items Discharge Condition: Stable Ambulatory Status: Ambulatory Discharge Destination: Home Transportation: Private Auto Accompanied By: daughter Schedule Follow-up Appointment: Yes Clinical Summary of Care: Patient Declined Electronic Signature(s) Signed: 02/18/2023 4:53:42 PM By: Samuella Bruin Entered By: Samuella Bruin on 02/18/2023 16:24:34 -------------------------------------------------------------------------------- Lower Extremity Assessment Details Patient Name: Date of ServiceTorrie Mayers RNA 02/18/2023 3:30 PM Medical Record Number: [id number] Patient Account Number: [id number] Date of Birth/Sex: Treating RN: 1924/01/27 (89 y.o. Fredderick Phenix Primary Care Jariel Drost: Ardean Larsen Other Clinician: Referring Jazzmen Restivo: Treating Eimi Viney/Extender: [name], [name] in Treatment: 13 Edema Assessment Assessed: [Left: No] [Right: No] Edema: [Left: N] [Right: o] Calf Left: Right: Point of Measurement: 31 cm From Medial Instep 30 cm Ankle Left: Right: Point of Measurement: 10 cm From Medial Instep 19 cm Vascular Assessment [name] ([id number]) [Right:[id number] Page 4 of 10] Pulses: Dorsalis Pedis Palpable: [Right:No] Extremity colors, hair growth, and conditions: Extremity Color: [Right:Hyperpigmented] Hair Growth on Extremity: [Right:No] Temperature of Extremity: [Right:Warm] Capillary Refill: [Right:< 3 seconds] Dependent Rubor: [Right:No No] Electronic Signature(s) Signed: 02/18/2023 4:53:42 PM By: Samuella Bruin Entered By: Samuella Bruin on 02/18/2023  15:56:17 -------------------------------------------------------------------------------- Multi Wound Chart Details Patient Name: Date of Service: [name], [place] RNA 02/18/2023 3:30 PM Medical Record Number: [id number] Patient Account Number: [id number] Date of Birth/Sex: Treating RN: 08/24/1923 (89 y.o. F) Primary Care Kymorah Korf: Ardean Larsen Other Clinician: Referring Landan Fedie: Treating Lashan Gluth/Extender: [name], [name] in Treatment: 13 Vital Signs Height(in): 62 Pulse(bpm): 87 Weight(lbs): 120 Blood Pressure(mmHg): 167/78 Body Mass Index(BMI): 21.9 Temperature(F): 98.2 Respiratory Rate(breaths/min): 18 [2:Photos:] [N/A:N/A] Right, Medial Ankle N/A N/A Wound Location: Gradually Appeared N/A N/A Wounding Event: Venous Leg Ulcer N/A N/A  Primary Etiology: Asthma, Hypertension N/A N/A Comorbid History: 06/08/2022 N/A N/A Date Acquired: 37 N/A N/A Weeks of Treatment: Open N/A N/A Wound Status: No N/A N/A Wound Recurrence: 0.1x0.1x0.1 N/A N/A Measurements L x W x D (cm) 0.008 N/A N/A A (cm) : rea 0.001 N/A N/A Volume (cm) : 93.70% N/A N/A % Reduction in A rea: 92.30% N/A N/A % Reduction in Volume: Full Thickness Without Exposed N/A N/A Classification: Support Structures Small N/A N/A Exudate A mount: Serosanguineous N/A N/A Exudate Type: red, brown N/A N/A Exudate Color: Distinct, outline attached N/A N/A Wound Margin: Small (1-33%) N/A N/A Granulation Amount: Red N/A N/A Granulation Quality: Large (67-100%) N/A N/A Necrotic Amount: Eschar N/A N/A Necrotic Tissue: Fat Layer (Subcutaneous Tissue): Yes N/A N/A Exposed Structures: Fascia: No Tendon: No Muscle: No [name] ([id number]) [id number] Page 5 of 10 Joint: No Bone: No Large (67-100%) N/A N/A Epithelialization: Excoriation: No N/A N/A Periwound Skin Texture: Induration: No Callus: No Crepitus: No Rash: No Scarring: No Maceration: No  N/A N/A Periwound Skin Moisture: Dry/Scaly: No Atrophie Blanche: No N/A N/A Periwound Skin Color: Cyanosis: No Ecchymosis: No Erythema: No Hemosiderin Staining: No Mottled: No Pallor: No Rubor: No Treatment Notes Wound #2 (Ankle) Wound Laterality: Right, Medial Cleanser Soap and Water Discharge Instruction: May shower and wash wound with dial antibacterial soap and water prior to dressing change. Vashe 5.8 (oz) Discharge Instruction: Cleanse the wound with Vashe prior to applying a clean dressing using gauze sponges, not tissue or cotton balls. Peri-Wound Care Triamcinolone 15 (g) Discharge Instruction: Use triamcinolone 15 (g) as directed Sween Lotion (Moisturizing lotion) Discharge Instruction: Apply moisturizing lotion as directed Topical Primary Dressing Promogran Prisma Matrix, 4.34 (sq in) (silver collagen) Discharge Instruction: Moisten collagen with saline or hydrogel Secondary Dressing Woven Gauze Sponge, Non-Sterile 4x4 in Discharge Instruction: Apply over primary dressing as directed. Secured With Compression Wrap Kerlix Roll 4.5x3.1 (in/yd) Discharge Instruction: Apply Kerlix and Coban compression as directed. Coban Self-Adherent Wrap 4x5 (in/yd) Discharge Instruction: Apply over Kerlix as directed. Compression Stockings Add-Ons Electronic Signature(s) Signed: 02/18/2023 4:52:09 PM By: Geralyn Corwin DO Entered By: Geralyn Corwin on 02/18/2023 16:29:40 -------------------------------------------------------------------------------- Multi-Disciplinary Care Plan Details Patient Name: Date of Service: [name], [name] RNA 02/18/2023 3:30 PM Medical Record Number: [id number] Patient Account Number: [id number] Date of Birth/Sex: Treating RN: 11-10-1923 (89 y.o. Fredderick Phenix Primary Care Bryla Burek: Ardean Larsen Other Clinician: [name] ([id number]) [id number] Page 6 of 10 Referring Numa Heatwole: Treating Delancey Moraes/Extender:  [name], [name] in Treatment: 13 Active Inactive Pain, Acute or Chronic Nursing Diagnoses: Pain, acute or chronic: actual or potential Potential alteration in comfort, pain Goals: Patient will verbalize adequate pain control and receive pain control interventions during procedures as needed Date Initiated: 11/13/2022 Date Inactivated: 12/18/2022 Target Resolution Date: 02/06/2023 Goal Status: Met Patient/caregiver will verbalize comfort level met Date Initiated: 11/13/2022 Target Resolution Date: 03/08/2023 Goal Status: Active Interventions: Assess comfort goal upon admission Encourage patient to take pain medications as prescribed Treatment Activities: Administer pain control measures as ordered : 11/13/2022 Notes: Venous Leg Ulcer Nursing Diagnoses: Knowledge deficit related to disease process and management Goals: Patient will maintain optimal edema control Date Initiated: 11/13/2022 Target Resolution Date: 03/08/2023 Goal Status: Active Interventions: Assess peripheral edema status every visit. Compression as ordered Provide education on venous insufficiency Notes: Wound/Skin Impairment Nursing Diagnoses: Knowledge deficit related to ulceration/compromised skin integrity Goals: Patient/caregiver will verbalize understanding of skin care regimen Date Initiated: 11/13/2022 Target Resolution Date: 03/08/2023 Goal Status: Active Interventions: Assess patient/caregiver ability  to perform ulcer/skin care regimen upon admission and as needed Assess ulceration(s) every visit Provide education on ulcer and skin care Treatment Activities: Skin care regimen initiated : 11/13/2022 Topical wound management initiated : 11/13/2022 Notes: Electronic Signature(s) Signed: 02/18/2023 4:53:42 PM By: Samuella Bruin Entered By: Samuella Bruin on 02/18/2023 16:21:09 [name] ([id number]) [id number] Page 7 of  10 -------------------------------------------------------------------------------- Pain Assessment Details Patient Name: Date of ServiceCorinna Lines, [name] RNA 02/18/2023 3:30 PM Medical Record Number: [id number] Patient Account Number: [id number] Date of Birth/Sex: Treating RN: 05/27/1924 (89 y.o. F) Primary Care Kenna Kirn: Ardean Larsen Other Clinician: Referring Shaniqwa Horsman: Treating Thedford Bunton/Extender: [name], [name] in Treatment: 61 Active Problems Location of Pain Severity and Description of Pain Patient Has Paino No Site Locations Pain Management and Medication Current Pain Management: Electronic Signature(s) Signed: 02/18/2023 3:49:13 PM By: Dayton Scrape Entered By: Dayton Scrape on 02/18/2023 15:44:10 -------------------------------------------------------------------------------- Patient/Caregiver Education Details Patient Name: Date of Service: [name], Marton [place] RNA 8/12/2024andnbsp3:30 PM Medical Record Number: [id number] Patient Account Number: [id number] Date of Birth/Gender: Treating RN: 1923/10/18 (89 y.o. Fredderick Phenix Primary Care Physician: Ardean Larsen Other Clinician: Referring Physician: Treating Physician/Extender: [name]: 13 Education Assessment Education Provided To: Patient Education Topics Provided Wound/Skin Impairment: Methods: Explain/Verbal Responses: Reinforcements needed, State content correctly [employer]) [place], [name] ([id number]) [id number] Page 8 of 10 Signed: 02/18/2023 4:53:42 PM By: Samuella Bruin Entered By: Samuella Bruin on 02/18/2023 16:07:27 -------------------------------------------------------------------------------- Wound Assessment Details Patient Name: Date of Service: [name] RNA 02/18/2023 3:30 PM Medical Record Number: [id number] Patient Account Number: [id number] Date of Birth/Sex: Treating RN: May 02, 1924 (89  y.o. F) Primary Care Fredonia Casalino: Ardean Larsen Other Clinician: Referring Brette Cast: Treating Kendle Turbin/Extender: [name], [name] in Treatment: 13 Wound Status Wound Number: 2 Primary Etiology: Venous Leg Ulcer Wound Location: Right, Medial Ankle Wound Status: Open Wounding Event: Gradually Appeared Comorbid History: Asthma, Hypertension Date Acquired: 06/08/2022 Weeks Of Treatment: 13 Clustered Wound: No Photos Wound Measurements Length: (cm) 0.1 Width: (cm) 0.1 Depth: (cm) 0.1 Area: (cm) 0.008 Volume: (cm) 0.001 % Reduction in Area: 93.7% % Reduction in Volume: 92.3% Epithelialization: Large (67-100%) Tunneling: No Undermining: No Wound Description Classification: Full Thickness Without Exposed Suppor Wound Margin: Distinct, outline attached Exudate Amount: Small Exudate Type: Serosanguineous Exudate Color: red, brown t Structures Foul Odor After Cleansing: No Slough/Fibrino No Wound Bed Granulation Amount: Small (1-33%) Exposed Structure Granulation Quality: Red Fascia Exposed: No Necrotic Amount: Large (67-100%) Fat Layer (Subcutaneous Tissue) Exposed: Yes Necrotic Quality: Eschar Tendon Exposed: No Muscle Exposed: No Joint Exposed: No Bone Exposed: No Periwound Skin Texture Texture Color No Abnormalities Noted: No No Abnormalities Noted: No Callus: No Atrophie Blanche: No Crepitus: No Cyanosis: No Excoriation: No Ecchymosis: No Induration: No Erythema: No Rash: No Hemosiderin Staining: No Scarring: No Mottled: No [name] ([id number]) [id number] Page 9 of 10 Pallor: No Moisture Rubor: No No Abnormalities Noted: No Dry / Scaly: No Maceration: No Treatment Notes Wound #2 (Ankle) Wound Laterality: Right, Medial Cleanser Soap and Water Discharge Instruction: May shower and wash wound with dial antibacterial soap and water prior to dressing change. Vashe 5.8 (oz) Discharge Instruction:  Cleanse the wound with Vashe prior to applying a clean dressing using gauze sponges, not tissue or cotton balls. Peri-Wound Care Triamcinolone 15 (g) Discharge Instruction: Use triamcinolone 15 (g) as directed Sween Lotion (Moisturizing lotion) Discharge Instruction: Apply moisturizing lotion as directed Topical Primary Dressing Promogran Prisma Matrix, 4.34 (sq in) (silver collagen) Discharge Instruction: Moisten collagen with  saline or hydrogel Secondary Dressing Woven Gauze Sponge, Non-Sterile 4x4 in Discharge Instruction: Apply over primary dressing as directed. Secured With Compression Wrap Kerlix Roll 4.5x3.1 (in/yd) Discharge Instruction: Apply Kerlix and Coban compression as directed. Coban Self-Adherent Wrap 4x5 (in/yd) Discharge Instruction: Apply over Kerlix as directed. Compression Stockings Add-Ons Electronic Signature(s) Signed: 02/18/2023 4:53:42 PM By: Samuella Bruin Previous Signature: 02/18/2023 3:49:13 PM Version By: Dayton Scrape Entered By: Samuella Bruin on 02/18/2023 16:12:50 -------------------------------------------------------------------------------- Vitals Details Patient Name: Date of Service: [name], [place] RNA 02/18/2023 3:30 PM Medical Record Number: [id number] Patient Account Number: [id number] Date of Birth/Sex: Treating RN: 01-16-1924 (89 y.o. F) Primary Care Brentton Wardlow: Ardean Larsen Other Clinician: Referring Cydni Reddoch: Treating Tonja Jezewski/Extender: [name], [name] in Treatment: 13 Vital Signs Time Taken: 03:43 Temperature (F): 98.2 Height (in): 62 Pulse (bpm): 87 Weight (lbs): 120 Respiratory Rate (breaths/min): 18 Body Mass Index (BMI): 21.9 Blood Pressure (mmHg): 167/78 Reference Range: 80 - 120 mg / dl Electronic Signature(s) [name] ([id number]) [id number] Page 10 of 10 Signed: 02/18/2023 3:49:13 PM By: Dayton Scrape Entered By: Dayton Scrape on 02/18/2023 15:44:05

## 2023-03-01 ENCOUNTER — Encounter (HOSPITAL_BASED_OUTPATIENT_CLINIC_OR_DEPARTMENT_OTHER): Payer: 59 | Admitting: General Surgery

## 2023-03-01 DIAGNOSIS — L97311 Non-pressure chronic ulcer of right ankle limited to breakdown of skin: Secondary | ICD-10-CM | POA: Diagnosis not present

## 2023-03-01 NOTE — Progress Notes (Signed)
[name] ([id number]) [id number] Page 1 of 1 Visit Report for 03/01/2023 SuperBill Details Patient Name: Date of ServiceTorrie Faulkner RNA 03/01/2023 Medical Record Number: [id number] Patient Account Number: [id number] Date of Birth/Sex: Treating RN: [birth year] (89 y.o. 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

## 2023-03-01 NOTE — Progress Notes (Signed)
Newhall, [name] ([id number]) [id number] Page 1 of 5 Visit Report for 03/01/2023 Arrival Information Details Patient Name: Date of ServiceCorinna Faulkner, [name] RNA 03/01/2023 11:00 A M Medical Record Number: [id number] Patient Account Number: [id number] Date of Birth/Sex: Treating RN: 10/31/1923 (89 y.o. F) Primary Care Jullie Arps: Ardean Larsen Other Clinician: Referring Daril Warga: Treating Krysteena Stalker/Extender: Juventino Slovak, [name] in Treatment: 15 Visit Information History Since Last Visit Added or deleted any medications: No Patient Arrived: Ambulatory Any new allergies or adverse reactions: No Arrival Time: 10:58 Had a fall or experienced change in No Accompanied By: daughter activities of daily living that may affect Transfer Assistance: None risk of falls: Patient Identification Verified: Yes Signs or symptoms of abuse/neglect since last visito No Secondary Verification Process Completed: Yes Hospitalized since last visit: No Patient Requires Transmission-Based Precautions: No Implantable device outside of the clinic excluding No Patient Has Alerts: Yes cellular tissue based products placed in the center Patient Alerts: ABI R 0.96 (11/29/22) since last visit: ABI L 1.25 (11/29/22) Has Dressing in Place as Prescribed: Yes Has Compression in Place as Prescribed: Yes Pain Present Now: No Electronic Signature(s) Signed: 03/01/2023 11:38:11 AM By: Thayer Dallas Entered By: Thayer Dallas on 03/01/2023 07:58:40 -------------------------------------------------------------------------------- Clinic Level of Care Assessment Details Patient Name: Date of ServiceTorrie Faulkner RNA 03/01/2023 11:00 A M Medical Record Number: [id number] Patient Account Number: [id number] Date of Birth/Sex: Treating RN: [birth year] (89 y.o. F) Primary Care Nathalee Smarr: Ardean Larsen Other Clinician: Referring Gurjit Loconte: Treating Dedric Ethington/Extender: Juventino Slovak,  [name] in Treatment: 15 Clinic Level of Care Assessment Items TOOL 4 Quantity Score []  - 0 Use when only an EandM is performed on FOLLOW-UP visit ASSESSMENTS - Nursing Assessment / Reassessment X- 1 10 Reassessment of Co-morbidities (includes updates in patient status) X- 1 5 Reassessment of Adherence to Treatment Plan ASSESSMENTS - Wound and Skin A ssessment / Reassessment []  - 0 Simple Wound Assessment / Reassessment - one wound []  - 0 Complex Wound Assessment / Reassessment - multiple wounds []  - 0 Dermatologic / Skin Assessment (not related to wound area) ASSESSMENTS - Focused Assessment []  - 0 Circumferential Edema Measurements - multi extremities []  - 0 Nutritional Assessment / Counseling / Intervention [name] ([id number]) [id number] Page 2 of 5 []  - 0 Lower Extremity Assessment (monofilament, tuning fork, pulses) []  - 0 Peripheral Arterial Disease Assessment (using hand held doppler) ASSESSMENTS - Ostomy and/or Continence Assessment and Care []  - 0 Incontinence Assessment and Management []  - 0 Ostomy Care Assessment and Management (repouching, etc.) PROCESS - Coordination of Care X - Simple Patient / Family Education for ongoing care 1 15 []  - 0 Complex (extensive) Patient / Family Education for ongoing care []  - 0 Staff obtains Chiropractor, Records, T Results / Process Orders est []  - 0 Staff telephones HHA, Nursing Homes / Clarify orders / etc []  - 0 Routine Transfer to another Facility (non-emergent condition) []  - 0 Routine Hospital Admission (non-emergent condition) []  - 0 New Admissions / Manufacturing engineer / Ordering NPWT Apligraf, etc. , []  - 0 Emergency Hospital Admission (emergent condition) X- 1 10 Simple Discharge Coordination []  - 0 Complex (extensive) Discharge Coordination PROCESS - Special Needs []  - 0 Pediatric / Minor Patient Management []  - 0 Isolation Patient Management []  - 0 Hearing /  Language / Visual special needs []  - 0 Assessment of Community assistance (transportation, D/C planning, etc.) []  - 0 Additional assistance / Altered mentation []  - 0 Support Surface(s) Assessment (bed, cushion, seat, etc.) INTERVENTIONS -  Wound Cleansing / Measurement X - Simple Wound Cleansing - one wound 1 5 []  - 0 Complex Wound Cleansing - multiple wounds []  - 0 Wound Imaging (photographs - any number of wounds) []  - 0 Wound Tracing (instead of photographs) []  - 0 Simple Wound Measurement - one wound []  - 0 Complex Wound Measurement - multiple wounds INTERVENTIONS - Wound Dressings X - Small Wound Dressing one or multiple wounds 1 10 []  - 0 Medium Wound Dressing one or multiple wounds []  - 0 Large Wound Dressing one or multiple wounds []  - 0 Application of Medications - topical []  - 0 Application of Medications - injection INTERVENTIONS - Miscellaneous []  - 0 External ear exam []  - 0 Specimen Collection (cultures, biopsies, blood, body fluids, etc.) []  - 0 Specimen(s) / Culture(s) sent or taken to Lab for analysis []  - 0 Patient Transfer (multiple staff / Nurse, adult / Similar devices) []  - 0 Simple Staple / Suture removal (25 or less) []  - 0 Complex Staple / Suture removal (26 or more) []  - 0 Hypo / Hyperglycemic Management (close monitor of Blood Glucose) [name] ([id number]) [phone].pdf Page 3 of 5 []  - 0 Ankle / Brachial Index (ABI) - do not check if billed separately []  - 0 Vital Signs Has the patient been seen at the hospital within the last three years: Yes Total Score: 55 Level Of Care: New/Established - Level 2 Electronic Signature(s) Signed: 03/01/2023 11:38:11 AM By: Thayer Dallas Entered By: Thayer Dallas on 03/01/2023 08:22:26 -------------------------------------------------------------------------------- Encounter Discharge Information Details Patient Name: Date of Service: [name], [place] RNA 03/01/2023 11:00 A  M Medical Record Number: [id number] Patient Account Number: [id number] Date of Birth/Sex: Treating RN: 09-Feb-1924 (89 y.o. F) Primary Care Akshaj Besancon: Ardean Larsen Other Clinician: Thayer Dallas Referring Itamar Mcgowan: Treating Italo Banton/Extender: Alfonse Ras in Treatment: 15 Encounter Discharge Information Items Discharge Condition: Stable Ambulatory Status: Ambulatory Discharge Destination: Home Transportation: Private Auto Accompanied By: self Schedule Follow-up Appointment: Yes Clinical Summary of Care: Electronic Signature(s) Signed: 03/01/2023 11:38:11 AM By: Thayer Dallas Entered By: Thayer Dallas on 03/01/2023 08:22:59 -------------------------------------------------------------------------------- Patient/Caregiver Education Details Patient Name: Date of Service: [name], [place] RNA 8/23/2024andnbsp11:00 A M Medical Record Number: [id number] Patient Account Number: [id number] Date of Birth/Gender: Treating RN: 1924-01-02 (89 y.o. F) Primary Care Physician: Ardean Larsen Other Clinician: Thayer Dallas Referring Physician: Treating Physician/Extender: Alfonse Ras in Treatment: 15 Education Assessment Education Provided To: Patient Education Topics Provided Electronic Signature(s) Signed: 03/01/2023 11:38:11 AM By: Thayer Dallas Entered By: Thayer Dallas on 03/01/2023 08:22:44 [name] ([id number]) [id number] Page 4 of 5 -------------------------------------------------------------------------------- Wound Assessment Details Patient Name: Date of ServiceCorinna Faulkner, [name] RNA 03/01/2023 11:00 A M Medical Record Number: [id number] Patient Account Number: [id number] Date of Birth/Sex: Treating RN: 1923-12-10 (89 y.o. F) Primary Care Elizah Mierzwa: Ardean Larsen Other Clinician: Referring Gevon Markus: Treating Janvi Ammar/Extender: Juventino Slovak, [name] in Treatment: 15 Wound  Status Wound Number: 2 Primary Etiology: Venous Leg Ulcer Wound Location: Right, Medial Ankle Wound Status: Open Wounding Event: Gradually Appeared Date Acquired: 06/08/2022 Weeks Of Treatment: 15 Clustered Wound: No Wound Measurements Length: (cm) 0.1 Width: (cm) 0.1 Depth: (cm) 0.1 Area: (cm) 0.008 Volume: (cm) 0.001 % Reduction in Area: 93.7% % Reduction in Volume: 92.3% Wound Description Classification: Full Thickness Without Exposed Suppor Exudate Amount: Small Exudate Type: Serosanguineous Exudate Color: red, brown t Structures Periwound Skin Texture Texture Color No Abnormalities Noted: No No Abnormalities Noted: No Moisture No Abnormalities Noted: No Treatment Notes Wound #  2 (Ankle) Wound Laterality: Right, Medial Cleanser Soap and Water Discharge Instruction: May shower and wash wound with dial antibacterial soap and water prior to dressing change. Vashe 5.8 (oz) Discharge Instruction: Cleanse the wound with Vashe prior to applying a clean dressing using gauze sponges, not tissue or cotton balls. Peri-Wound Care Triamcinolone 15 (g) Discharge Instruction: Use triamcinolone 15 (g) as directed Sween Lotion (Moisturizing lotion) Discharge Instruction: Apply moisturizing lotion as directed Topical Primary Dressing Promogran Prisma Matrix, 4.34 (sq in) (silver collagen) Discharge Instruction: Moisten collagen with saline or hydrogel Secondary Dressing Woven Gauze Sponge, Non-Sterile 4x4 in Discharge Instruction: Apply over primary dressing as directed. Secured With Compression Wrap Kerlix Roll 4.5x3.1 (in/yd) Discharge Instruction: Apply Kerlix and Coban compression as directed. Harris, [name] ([id number]) [id number] Page 5 of 5 Coban Self-Adherent Wrap 4x5 (in/yd) Discharge Instruction: Apply over Kerlix as directed. Compression Stockings Add-Ons Electronic Signature(s) Signed: 03/01/2023 11:38:11 AM By: Thayer Dallas Entered  By: Thayer Dallas on 03/01/2023 08:01:06 -------------------------------------------------------------------------------- Vitals Details Patient Name: Date of Service: [name], [place] RNA 03/01/2023 11:00 A M Medical Record Number: [id number] Patient Account Number: [id number] Date of Birth/Sex: Treating RN: 09/18/1923 (89 y.o. F) Primary Care Riyansh Gerstner: Ardean Larsen Other Clinician: Referring Shakeera Rightmyer: Treating Orvan Papadakis/Extender: Juventino Slovak, [name] in Treatment: 15 Vital Signs Time Taken: 10:58 Reference Range: 80 - 120 mg / dl Height (in): 62 Weight (lbs): 120 Body Mass Index (BMI): 21.9 Electronic Signature(s) Signed: 03/01/2023 11:38:11 AM By: Thayer Dallas Entered By: Thayer Dallas on 03/01/2023 07:58:47

## 2023-03-05 NOTE — Progress Notes (Signed)
[place], [name] ([id number]) [id number] Page 1 of 8 Visit Report for 02/12/2023 Arrival Information Details Patient Name: Date of ServiceTorrie Faulkner RNA 02/12/2023 3:45 PM Medical Record Number: [id number] Patient Account Number: [id number] Date of Birth/Sex: Treating RN: 06-25-1924 (89 y.o. F) Primary Care Jaedah Lords: Ardean Larsen Other Clinician: Referring Safiyya Stokes: Treating Chlora Mcbain/Extender: [name], [name] in Treatment: 13 Visit Information History Since Last Visit Added or deleted any medications: No Patient Arrived: Ambulatory Any new allergies or adverse reactions: No Arrival Time: 15:50 Had a fall or experienced change in No Accompanied By: daughter activities of daily living that may affect Transfer Assistance: None risk of falls: Patient Identification Verified: Yes Signs or symptoms of abuse/neglect since last visito No Secondary Verification Process Completed: Yes Hospitalized since last visit: No Patient Requires Transmission-Based Precautions: No Implantable device outside of the clinic excluding No Patient Has Alerts: Yes cellular tissue based products placed in the center Patient Alerts: ABI R 0.96 (11/29/22) since last visit: ABI L 1.25 (11/29/22) Has Dressing in Place as Prescribed: Yes Has Compression in Place as Prescribed: Yes Pain Present Now: No Electronic Signature(s) Signed: 02/15/2023 12:52:02 PM By: Thayer Dallas Entered By: Thayer Dallas on 02/12/2023 15:51:11 -------------------------------------------------------------------------------- Clinic Level of Care Assessment Details Patient Name: Date of ServiceTorrie Faulkner RNA 02/12/2023 3:45 PM Medical Record Number: [id number] Patient Account Number: [id number] Date of Birth/Sex: Treating RN: 1923-09-02 (89 y.o. [name] Primary Care Rylah Fukuda: Ardean Larsen Other Clinician: Referring Kenroy Timberman: Treating Donavon Kimrey/Extender: [name]: 13 Clinic Level of Care Assessment Items TOOL 4 Quantity Score X- 1 0 Use when only an EandM is performed on FOLLOW-UP visit ASSESSMENTS - Nursing Assessment / Reassessment X- 1 10 Reassessment of Co-morbidities (includes updates in patient status) X- 1 5 Reassessment of Adherence to Treatment Plan ASSESSMENTS - Wound and Skin A ssessment / Reassessment X - Simple Wound Assessment / Reassessment - one wound 1 5 X- 1 5 Complex Wound Assessment / Reassessment - multiple wounds X- 1 10 Dermatologic / Skin Assessment (not related to wound area) ASSESSMENTS - Focused Assessment []  - 0 Circumferential Edema Measurements - multi extremities []  - 0 Nutritional Assessment / Counseling / Intervention [name] ([id number]) [id number] Page 2 of 8 []  - 0 Lower Extremity Assessment (monofilament, tuning fork, pulses) []  - 0 Peripheral Arterial Disease Assessment (using hand held doppler) ASSESSMENTS - Ostomy and/or Continence Assessment and Care []  - 0 Incontinence Assessment and Management []  - 0 Ostomy Care Assessment and Management (repouching, etc.) PROCESS - Coordination of Care X - Simple Patient / Family Education for ongoing care 1 15 []  - 0 Complex (extensive) Patient / Family Education for ongoing care X- 1 10 Staff obtains Chiropractor, Records, T Results / Process Orders est []  - 0 Staff telephones HHA, Nursing Homes / Clarify orders / etc []  - 0 Routine Transfer to another Facility (non-emergent condition) []  - 0 Routine Hospital Admission (non-emergent condition) []  - 0 New Admissions / Manufacturing engineer / Ordering NPWT Apligraf, etc. , []  - 0 Emergency Hospital Admission (emergent condition) X- 1 10 Simple Discharge Coordination []  - 0 Complex (extensive) Discharge Coordination PROCESS - Special Needs []  - 0 Pediatric / Minor Patient Management []  - 0 Isolation Patient  Management []  - 0 Hearing / Language / Visual special needs []  - 0 Assessment of Community assistance (transportation, D/C planning, etc.) []  - 0 Additional assistance / Altered mentation []  - 0 Support Surface(s) Assessment (bed, cushion, seat, etc.)  INTERVENTIONS - Wound Cleansing / Measurement X - Simple Wound Cleansing - one wound 1 5 []  - 0 Complex Wound Cleansing - multiple wounds []  - 0 Wound Imaging (photographs - any number of wounds) []  - 0 Wound Tracing (instead of photographs) X- 1 5 Simple Wound Measurement - one wound []  - 0 Complex Wound Measurement - multiple wounds INTERVENTIONS - Wound Dressings []  - 0 Small Wound Dressing one or multiple wounds X- 1 15 Medium Wound Dressing one or multiple wounds []  - 0 Large Wound Dressing one or multiple wounds []  - 0 Application of Medications - topical []  - 0 Application of Medications - injection INTERVENTIONS - Miscellaneous []  - 0 External ear exam []  - 0 Specimen Collection (cultures, biopsies, blood, body fluids, etc.) []  - 0 Specimen(s) / Culture(s) sent or taken to Lab for analysis []  - 0 Patient Transfer (multiple staff / Nurse, adult / Similar devices) []  - 0 Simple Staple / Suture removal (25 or less) []  - 0 Complex Staple / Suture removal (26 or more) []  - 0 Hypo / Hyperglycemic Management (close monitor of Blood Glucose) [name] ([id number]) [id number] Page 3 of 8 []  - 0 Ankle / Brachial Index (ABI) - do not check if billed separately X- 1 5 Vital Signs Has the patient been seen at the hospital within the last three years: Yes Total Score: 100 Level Of Care: New/Established - Level 3 Electronic Signature(s) Signed: 03/05/2023 2:03:55 PM By: Brenton Grills Entered By: Brenton Grills on 02/12/2023 16:25:56 -------------------------------------------------------------------------------- Encounter Discharge Information Details Patient Name: Date of Service: [name], LO RNA 02/12/2023 3:45 PM Medical Record Number: [id number] Patient Account Number: [id number] Date of Birth/Sex: Treating RN: [birth year] (89 y.o. [name] Primary Care Meeyah Ovitt: Ardean Larsen Other Clinician: Referring Arthur Aydelotte: Treating Maison Agrusa/Extender: [name]: 67 Encounter Discharge Information Items Discharge Condition: Stable Ambulatory Status: Ambulatory Discharge Destination: Home Transportation: Private Auto Accompanied By: daughter Schedule Follow-up Appointment: Yes Clinical Summary of Care: Patient Declined Electronic Signature(s) Signed: 03/05/2023 2:03:55 PM By: Brenton Grills Entered By: Brenton Grills on 02/12/2023 16:26:48 -------------------------------------------------------------------------------- Lower Extremity Assessment Details Patient Name: Date of ServiceTorrie Faulkner RNA 02/12/2023 3:45 PM Medical Record Number: [id number] Patient Account Number: [id number] Date of Birth/Sex: Treating RN: Oct 10, 1923 (89 y.o. F) Primary Care Chenika Nevils: Ardean Larsen Other Clinician: Referring Dametria Tuzzolino: Treating Carine Nordgren/Extender: [name], [name] in Treatment: 13 Edema Assessment Assessed: [Left: No] [Right: No] Edema: [Left: N] [Right: o] Calf Left: Right: Point of Measurement: 31 cm From Medial Instep 29.5 cm Ankle Left: Right: Point of Measurement: 10 cm From Medial Instep 18.5 cm Vascular Assessment Left: [[id number].pdf Page 4 of 8Right:] Extremity colors, hair growth, and conditions: Extremity Color: [id number].pdf Page 4 of 8Hyperpigmented] Hair Growth on Extremity: [phone].pdf Page 4 of 8No] Temperature of Extremity: [id number] Page 4 of 8Warm] Capillary Refill: [id number].pdf Page 4 of 8< 3 seconds] Dependent Rubor:  [phone].pdf Page 4 of 8No No] Electronic Signature(s) Signed: 02/15/2023 12:52:02 PM By: Thayer Dallas Entered By: Thayer Dallas on 02/12/2023 15:56:08 -------------------------------------------------------------------------------- Multi Wound Chart Details Patient Name: Date of Service: [name], LO RNA 02/12/2023 3:45 PM Medical Record Number: [id number] Patient Account Number: [id number] Date of Birth/Sex: Treating RN: [birth year] (89 y.o. F) Primary Care [name]: Ardean Larsen Other Clinician: Referring Abran Gavigan: Treating Ronalda Walpole/Extender: [name], [name] in Treatment: 13 Vital Signs Height(in): 62 Pulse(bpm): 87 Weight(lbs): 120 Blood Pressure(mmHg): 158/74 Body Mass Index(BMI): 21.9 Temperature(F): 98.2 Respiratory Rate(breaths/min): 16 [2:Photos:] [N/A:N/A] Right,  Medial Ankle N/A N/A Wound Location: Gradually Appeared N/A N/A Wounding Event: Venous Leg Ulcer N/A N/A Primary Etiology: Asthma, Hypertension N/A N/A Comorbid History: 06/08/2022 N/A N/A Date Acquired: 66 N/A N/A Weeks of Treatment: Open N/A N/A Wound Status: No N/A N/A Wound Recurrence: 0.1x0.1x0.1 N/A N/A Measurements L x W x D (cm) 0.008 N/A N/A A (cm) : rea 0.001 N/A N/A Volume (cm) : 93.70% N/A N/A % Reduction in A rea: 92.30% N/A N/A % Reduction in Volume: Full Thickness Without Exposed N/A N/A Classification: Support Structures Medium N/A N/A Exudate Amount: Serosanguineous N/A N/A Exudate Type: red, brown N/A N/A Exudate Color: Distinct, outline attached N/A N/A Wound Margin: Large (67-100%) N/A N/A Granulation Amount: Small (1-33%) N/A N/A Necrotic Amount: Fat Layer (Subcutaneous Tissue): Yes N/A N/A Exposed Structures: Fascia: No Tendon: No Muscle: No Joint: No Bone: No Large (67-100%) N/A N/A Epithelialization: Excoriation: No N/A N/A Periwound Skin TextureSherre Faulkner ([id number])  [id number] Page 5 of 8 Induration: No Callus: No Crepitus: No Rash: No Scarring: No Maceration: No N/A N/A Periwound Skin Moisture: Dry/Scaly: No Atrophie Blanche: No N/A N/A Periwound Skin Color: Cyanosis: No Ecchymosis: No Erythema: No Hemosiderin Staining: No Mottled: No Pallor: No Rubor: No Treatment Notes Electronic Signature(s) Signed: 02/12/2023 5:13:05 PM By: Geralyn Corwin DO Entered By: Geralyn Corwin on 02/12/2023 16:19:22 -------------------------------------------------------------------------------- Multi-Disciplinary Care Plan Details Patient Name: Date of Service: [name], LO RNA 02/12/2023 3:45 PM Medical Record Number: [id number] Patient Account Number: [id number] Date of Birth/Sex: Treating RN: 1924-05-04 (89 y.o. [name] Primary Care Jamaul Heist: Ardean Larsen Other Clinician: Referring Dalaysia Harms: Treating Marnita Poirier/Extender: [name], [name] in Treatment: 13 Active Inactive Pain, Acute or Chronic Nursing Diagnoses: Pain, acute or chronic: actual or potential Potential alteration in comfort, pain Goals: Patient will verbalize adequate pain control and receive pain control interventions during procedures as needed Date Initiated: 11/13/2022 Date Inactivated: 12/18/2022 Target Resolution Date: 02/06/2023 Goal Status: Met Patient/caregiver will verbalize comfort level met Date Initiated: 11/13/2022 Target Resolution Date: 02/06/2023 Goal Status: Active Interventions: Assess comfort goal upon admission Encourage patient to take pain medications as prescribed Treatment Activities: Administer pain control measures as ordered : 11/13/2022 Notes: Venous Leg Ulcer Nursing Diagnoses: Knowledge deficit related to disease process and management Goals: Patient will maintain optimal edema control Date Initiated: 11/13/2022 Target Resolution Date: 02/06/2023 Goal Status: Active Interventions: Assess  peripheral edema status every visit. [place], [name] ([id number]) [id number] Page 6 of 8 Compression as ordered Provide education on venous insufficiency Notes: Wound/Skin Impairment Nursing Diagnoses: Knowledge deficit related to ulceration/compromised skin integrity Goals: Patient/caregiver will verbalize understanding of skin care regimen Date Initiated: 11/13/2022 Target Resolution Date: 02/06/2023 Goal Status: Active Interventions: Assess patient/caregiver ability to perform ulcer/skin care regimen upon admission and as needed Assess ulceration(s) every visit Provide education on ulcer and skin care Treatment Activities: Skin care regimen initiated : 11/13/2022 Topical wound management initiated : 11/13/2022 Notes: Electronic Signature(s) Signed: 03/05/2023 2:03:55 PM By: Brenton Grills Entered By: Brenton Grills on 02/12/2023 16:08:32 -------------------------------------------------------------------------------- Pain Assessment Details Patient Name: Date of ServiceTorrie Faulkner RNA 02/12/2023 3:45 PM Medical Record Number: [id number] Patient Account Number: [id number] Date of Birth/Sex: Treating RN: 1924-06-13 (89 y.o. F) Primary Care Shruthi Northrup: Ardean Larsen Other Clinician: Referring Falen Lehrmann: Treating Maanya Hippert/Extender: [name], [name] in Treatment: 31 Active Problems Location of Pain Severity and Description of Pain Patient Has Paino No Site Locations Pain Management and Medication Current Pain Management: Electronic Signature(s) [name], [name] ([id number]) [id number] Page 7 of 8  Signed: 02/15/2023 12:52:02 PM By: Thayer Dallas Entered By: Thayer Dallas on 02/12/2023 15:51:20 -------------------------------------------------------------------------------- Patient/Caregiver Education Details Patient Name: Date of Service: [name], Marton Redwood RNA 8/6/2024andnbsp3:45 PM Medical Record Number:  [id number] Patient Account Number: [id number] Date of Birth/Gender: Treating RN: Aug 28, 1923 (89 y.o. [name] Primary Care Physician: Ardean Larsen Other Clinician: Referring Physician: Treating Physician/Extender: [name]: 13 Education Assessment Education Provided To: Patient and Caregiver Education Topics Provided Wound/Skin Impairment: Methods: Explain/Verbal Responses: State content correctly Electronic Signature(s) Signed: 03/05/2023 2:03:55 PM By: Brenton Grills Entered By: Brenton Grills on 02/12/2023 16:08:53 -------------------------------------------------------------------------------- Wound Assessment Details Patient Name: Date of Service: [name] RNA 02/12/2023 3:45 PM Medical Record Number: [id number] Patient Account Number: [id number] Date of Birth/Sex: Treating RN: Oct 22, 1923 (89 y.o. F) Primary Care Garyn Waguespack: Ardean Larsen Other Clinician: Referring Mckenzie Toruno: Treating Saleha Kalp/Extender: [name], [name] in Treatment: 13 Wound Status Wound Number: 2 Primary Etiology: Venous Leg Ulcer Wound Location: Right, Medial Ankle Wound Status: Open Wounding Event: Gradually Appeared Comorbid History: Asthma, Hypertension Date Acquired: 06/08/2022 Weeks Of Treatment: 13 Clustered Wound: No Photos [name] ([id number]) [id number] Page 8 of 8 Wound Measurements Length: (cm) 0.1 Width: (cm) 0.1 Depth: (cm) 0.1 Area: (cm) 0.008 Volume: (cm) 0.001 % Reduction in Area: 93.7% % Reduction in Volume: 92.3% Epithelialization: Large (67-100%) Tunneling: No Undermining: No Wound Description Classification: Full Thickness Without Exposed Support Structures Wound Margin: Distinct, outline attached Exudate Amount: Medium Exudate Type: Serosanguineous Exudate Color: red, brown Foul Odor After Cleansing: No Slough/Fibrino No Wound Bed Granulation Amount: Large  (67-100%) Exposed Structure Necrotic Amount: Small (1-33%) Fascia Exposed: No Necrotic Quality: Adherent Slough Fat Layer (Subcutaneous Tissue) Exposed: Yes Tendon Exposed: No Muscle Exposed: No Joint Exposed: No Bone Exposed: No Periwound Skin Texture Texture Color No Abnormalities Noted: No No Abnormalities Noted: No Callus: No Atrophie Blanche: No Crepitus: No Cyanosis: No Excoriation: No Ecchymosis: No Induration: No Erythema: No Rash: No Hemosiderin Staining: No Scarring: No Mottled: No Pallor: No Moisture Rubor: No No Abnormalities Noted: No Dry / Scaly: No Maceration: No Electronic Signature(s) Signed: 02/15/2023 12:52:02 PM By: Thayer Dallas Entered By: Thayer Dallas on 02/12/2023 16:03:15 -------------------------------------------------------------------------------- Vitals Details Patient Name: Date of Service: [name], LO RNA 02/12/2023 3:45 PM Medical Record Number: [id number] Patient Account Number: [id number] Date of Birth/Sex: Treating RN: 10/31/1923 (89 y.o. F) Primary Care Kayani Rapaport: Ardean Larsen Other Clinician: Referring Syncere Eble: Treating Deyona Soza/Extender: [name], [name] in Treatment: 13 Vital Signs Time Taken: 15:57 Temperature (F): 98.2 Height (in): 62 Pulse (bpm): 87 Weight (lbs): 120 Respiratory Rate (breaths/min): 16 Body Mass Index (BMI): 21.9 Blood Pressure (mmHg): 158/74 Reference Range: 80 - 120 mg / dl Electronic Signature(s) Signed: 02/15/2023 12:52:02 PM By: Thayer Dallas Entered By: Thayer Dallas on 02/12/2023 15:58:02

## 2023-03-05 NOTE — Progress Notes (Signed)
[name] ([id number]) [id number] Page 1 of 7 Visit Report for 02/12/2023 Chief Complaint Document Details Patient Name: Date of Service: [name] RNA 02/12/2023 3:45 PM Medical Record Number: [id number] Patient Account Number: [id number] Date of Birth/Sex: Treating RN: 1924/03/30 (89 y.o. F) Primary Care Provider: Ardean Larsen Other Clinician: Referring Provider: Treating Provider/Extender: [name], [name] in Treatment: 13 Information Obtained from: Patient Chief Complaint 11/13/2022; patient is here for review of wounds on her right lateral and right medial ankle Electronic Signature(s) Signed: 02/12/2023 5:13:05 PM By: Geralyn Corwin DO Entered By: Geralyn Corwin on 02/12/2023 16:19:31 -------------------------------------------------------------------------------- HPI Details Patient Name: Date of Service: [name], LO RNA 02/12/2023 3:45 PM Medical Record Number: [id number] Patient Account Number: [id number] Date of Birth/Sex: Treating RN: Nov 07, 1923 (89 y.o. F) Primary Care Provider: Ardean Larsen Other Clinician: Referring Provider: Treating Provider/Extender: [name], [name] in Treatment: 13 History of Present Illness HPI Description: ADMISSION 11/12/2021 This is a 89 year old independent woman who arrives for review of wounds on her lateral and medial ankle. It is a bit difficult to get a precise history here but it is quite clear that these have been present for many months. She states that she normally wore stockings however the wound laterally started to drain too much so she has not been wearing them. She has developed a circular erythematous area around the wounds on the lateral malleolus and her primary care doctor has given her samples of Nuzyra to use for cellulitis. Past medical history is actually very limited to hypertension, hearing loss. She had a rectal resection for cancer in 2008. In  2023 she had a small bowel obstruction. She also says she has a history of cellulitis of both legs ABIs in our clinic were difficult to obtain at 0.61 bilaterally 5/16; patient presents for follow-up. We have been using Hydrofera Blue under Kerlix/Coban to the right lower extremity. She is scheduled to have her ABIs done next week. She has no issues or complaints. She tolerated the wrap well. 5/28; patient presents for follow-up. We have been using Hydrofera Blue under Kerlix/Coban to the right lower extremity wounds. She took the wraps off to have her ABIs and TBI's completed. She has been without the wrap or dressings for the past 5 days. She has irritation to the right lateral leg. Her ABIs were noted to be 0.96 on the right with monophasic waveforms throughout and a TBI of 0.3. 6/4; right medial malleolus and just behind the right lateral malleolus. We have been using topical antibiotics and Hydrofera Blue under kerlix Coban, can compression. There was a referral I think placed to vein and vascular for follow-up of her arterial studies 6/11; patient presents for follow-up. We have been using antibiotic ointment and Hydrofera Blue under Kerlix/Coban to the right lateral and medial feet wounds. She still has not heard from vein and vascular for follow-up of arterial study results. 6/24; patient presents for follow-up. We have been using antibiotic ointment and Hydrofera Blue under Kerlix/Coban to the feet wounds. The right lateral foot wound has healed. She has been contacted by vein and vascular however patient does not want to follow-up. 7/16; patient presents for follow-up. We have been using antibiotic ointment with Hydrofera Blue under Kerlix/Coban. The medial right ankle wound is smaller. [name] ([id number]) [id number] Page 2 of 7 7/22; patient presents for follow-up. We have been using antibiotic ointment with Hydrofera Blue under Kerlix/Coban to the  medial right ankle wound. This wound is smaller. Patient  has no issues or complaints today. 7/29; patient presents for follow-up. We have been using antibiotic ointment with Hydrofera Blue under Kerlix/Coban to the medial right ankle wound. Wound is much smaller today. She has no issues or complaints. 8/6; patient presents for follow-up. We have been using collagen to the right medial ankle wound under compression wrap. Wound is smaller. She is scheduled to see vein and vascular tomorrow due to results of ABIs. Electronic Signature(s) Signed: 02/12/2023 5:13:05 PM By: Geralyn Corwin DO Entered By: Geralyn Corwin on 02/12/2023 16:23:06 -------------------------------------------------------------------------------- Physical Exam Details Patient Name: Date of ServiceTorrie Faulkner RNA 02/12/2023 3:45 PM Medical Record Number: [id number] Patient Account Number: [id number] Date of Birth/Sex: Treating RN: 02-20-1924 (89 y.o. F) Primary Care Provider: Ardean Larsen Other Clinician: Referring Provider: Treating Provider/Extender: [name], [name] in Treatment: 13 Constitutional respirations regular, non-labored and within target range for patient.. Cardiovascular 2+ dorsalis pedis/posterior tibialis pulses. Psychiatric pleasant and cooperative. Notes Small open wound to the right medial malleolus with granulation tissue. No signs of infection. Significant venous stasis dermatitis and varicosities throughout the leg. Good edema control. Electronic Signature(s) Signed: 02/12/2023 5:13:05 PM By: Geralyn Corwin DO Entered By: Geralyn Corwin on 02/12/2023 16:22:05 -------------------------------------------------------------------------------- Physician Orders Details Patient Name: Date of Service: [name], LO RNA 02/12/2023 3:45 PM Medical Record Number: [id number] Patient Account Number: [id number] Date of Birth/Sex: Treating RN: Apr 07, 1924 (89 y.o. [name] Primary Care Provider: Ardean Larsen Other Clinician: Referring Provider: Treating Provider/Extender: [name]: 68 Verbal / Phone Orders: No Diagnosis Coding Follow-up Appointments ppointment in 1 week. - Dr. Mikey Bussing Room 9, Monday August 12th at 3:30pm Return A Other: - Vein and Vascular appt 02/13/2023 ensure you go. Anesthetic (In clinic) Topical Lidocaine 4% applied to wound bed [address] [place], [place] ([id number]) [id number] Page 3 of 7 May shower with protection but do not get wound dressing(s) wet. Protect dressing(s) with water repellant cover (for example, large plastic bag) or a cast cover and may then take shower. Edema Control - Lymphedema / SCD / Other Elevate legs to the level of the heart or above for 30 minutes daily and/or when sitting for 3-4 times a day throughout the day. Avoid standing for long periods of time. Exercise regularly Wound Treatment Wound #2 - Ankle Wound Laterality: Right, Medial Cleanser: Soap and Water 1 x Per Week/30 Days Discharge Instructions: May shower and wash wound with dial antibacterial soap and water prior to dressing change. Cleanser: Vashe 5.8 (oz) 1 x Per Week/30 Days Discharge Instructions: Cleanse the wound with Vashe prior to applying a clean dressing using gauze sponges, not tissue or cotton balls. Peri-Wound Care: Triamcinolone 15 (g) 1 x Per Week/30 Days Discharge Instructions: Use triamcinolone 15 (g) as directed Peri-Wound Care: Sween Lotion (Moisturizing lotion) 1 x Per Week/30 Days Discharge Instructions: Apply moisturizing lotion as directed Prim Dressing: Promogran Prisma Matrix, 4.34 (sq in) (silver collagen) 1 x Per Week/30 Days ary Discharge Instructions: Moisten collagen with saline or hydrogel Secondary Dressing: Zetuvit Plus Silicone Border Dressing 7x7(in/in) 1 x Per Week/30 Days Discharge Instructions: Apply silicone  border over primary dressing as directed. Compression Wrap: Tubigrip 1 x Per Week/30 Days Electronic Signature(s) Signed: 02/12/2023 5:13:05 PM By: Geralyn Corwin DO Entered By: Geralyn Corwin on 02/12/2023 16:22:11 -------------------------------------------------------------------------------- Problem List Details Patient Name: Date of Service: [name], Marton Redwood RNA 02/12/2023 3:45 PM Medical Record Number: [id number] Patient Account Number: [id number] Date of Birth/Sex: Treating RN: [birth year] (89 y.o.  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 11/13/2022 No Yes I10 Essential (primary) hypertension 11/13/2022 No Yes [name] ([id number]) [id number] Page 4 of 7 Inactive Problems Resolved Problems Electronic Signature(s) Signed: 02/12/2023 5:13:05 PM By: Geralyn Corwin DO Entered By: Geralyn Corwin on 02/12/2023 16:19:16 -------------------------------------------------------------------------------- Progress Note Details Patient Name: Date of Service: [name], LO RNA 02/12/2023 3:45 PM Medical Record Number: [id number] Patient Account Number: [id number] Date of Birth/Sex: Treating RN: 1923/11/18 (89 y.o. F) Primary Care Provider: Ardean Larsen Other Clinician: Referring Provider: Treating Provider/Extender: [name], [name] in Treatment: 13 Subjective Chief Complaint Information obtained from Patient 11/13/2022; patient is here for  review of wounds on her right lateral and right medial ankle History of Present Illness (HPI) ADMISSION 11/12/2021 This is a 89 year old independent woman who arrives for review of wounds on her lateral and medial ankle. It is a bit difficult to get a precise history here but it is quite clear that these have been present for many months. She states that she normally wore stockings however the wound laterally started to drain too much so she has not been wearing them. She has developed a circular erythematous area around the wounds on the lateral malleolus and her primary care doctor has given her samples of Nuzyra to use for cellulitis. Past medical history is actually very limited to hypertension, hearing loss. She had a rectal resection for cancer in 2008. In 2023 she had a small bowel obstruction. She also says she has a history of cellulitis of both legs ABIs in our clinic were difficult to obtain at 0.61 bilaterally 5/16; patient presents for follow-up. We have been using Hydrofera Blue under Kerlix/Coban to the right lower extremity. She is scheduled to have her ABIs done next week. She has no issues or complaints. She tolerated the wrap well. 5/28; patient presents for follow-up. We have been using Hydrofera Blue under Kerlix/Coban to the right lower extremity wounds. She took the wraps off to have her ABIs and TBI's completed. She has been without the wrap or dressings for the past 5 days. She has irritation to the right lateral leg. Her ABIs were noted to be 0.96 on the right with monophasic waveforms throughout and a TBI of 0.3. 6/4; right medial malleolus and just behind the right lateral malleolus. We have been using topical antibiotics and Hydrofera Blue under kerlix Coban, can compression. There was a referral I think placed to vein and vascular for follow-up of her arterial studies 6/11; patient presents for follow-up. We have been using antibiotic ointment and Hydrofera Blue under  Kerlix/Coban to the right lateral and medial feet wounds. She still has not heard from vein and vascular for follow-up of arterial study results. 6/24; patient presents for follow-up. We have been using antibiotic ointment and Hydrofera Blue under Kerlix/Coban to the feet wounds. The right lateral foot wound has healed. She has been contacted by vein and vascular however patient does not want to follow-up. 7/16; patient presents for follow-up. We have been using antibiotic ointment with Hydrofera Blue under Kerlix/Coban. The medial right ankle wound is smaller. 7/22;  patient presents for follow-up. We have been using antibiotic ointment with Hydrofera Blue under Kerlix/Coban to the medial right ankle wound. This wound is smaller. Patient has no issues or complaints today. 7/29; patient presents for follow-up. We have been using antibiotic ointment with Hydrofera Blue under Kerlix/Coban to the medial right ankle wound. Wound is much smaller today. She has no issues or complaints. 8/6; patient presents for follow-up. We have been using collagen to the right medial ankle wound under compression wrap. Wound is smaller. She is scheduled to see vein and vascular tomorrow due to results of ABIs. Patient History Social History Never smoker, Alcohol Use - Never, Drug Use - No History, Caffeine Use - Never. Medical History Respiratory Patient has history of Asthma Cardiovascular Patient has history of Hypertension Oncologic MYSTICA, CEDERQUIST ([id number]) [id number] Page 5 of 7 Denies history of Received Chemotherapy, Received Radiation Hospitalization/Surgery History - 2023 SBO, inguinal hernia, gangrene, FTT- bowel resection. - tonsillectomy and adenoidectomy. Medical A Surgical History Notes nd Cardiovascular aortic stenosis Gastrointestinal rectal Ca Integumentary (Skin) left lower leg cellulitis Oncologic 2008 rectal Ca Objective Constitutional respirations  regular, non-labored and within target range for patient.. Vitals Time Taken: 3:57 PM, Height: 62 in, Weight: 120 lbs, BMI: 21.9, Temperature: 98.2 F, Pulse: 87 bpm, Respiratory Rate: 16 breaths/min, Blood Pressure: 158/74 mmHg. Cardiovascular 2+ dorsalis pedis/posterior tibialis pulses. Psychiatric pleasant and cooperative. General Notes: Small open wound to the right medial malleolus with granulation tissue. No signs of infection. Significant venous stasis dermatitis and varicosities throughout the leg. Good edema control. Integumentary (Hair, Skin) Wound #2 status is Open. Original cause of wound was Gradually Appeared. The date acquired was: 06/08/2022. The wound has been in treatment 13 weeks. The wound is located on the Right,Medial Ankle. The wound measures 0.1cm length x 0.1cm width x 0.1cm depth; 0.008cm^2 area and 0.001cm^3 volume. There is Fat Layer (Subcutaneous Tissue) exposed. There is no tunneling or undermining noted. There is a medium amount of serosanguineous drainage noted. The wound margin is distinct with the outline attached to the wound base. There is large (67-100%) granulation within the wound bed. There is a small (1-33%) amount of necrotic tissue within the wound bed including Adherent Slough. The periwound skin appearance did not exhibit: Callus, Crepitus, Excoriation, Induration, Rash, Scarring, Dry/Scaly, Maceration, Atrophie Blanche, Cyanosis, Ecchymosis, Hemosiderin Staining, Mottled, Pallor, Rubor, Erythema. 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 We have been using collagen  under compression wrap. Since she is scheduled to see vein and vascular tomorrow we will not wrap her today but I recommended continuing collagen and using Tubigrip for compression. Follow-up early next week for potential wrap replacement. Plan Follow-up Appointments: Return Appointment in 1 week. - Dr. Mikey Bussing Room 9, Monday August 12th at 3:30pm Other: - Vein and Vascular appt 02/13/2023 ensure you go. Anesthetic: (In clinic) Topical Lidocaine 4% applied to wound bed Bathing/ Shower/ Hygiene: May shower with protection but do not get wound dressing(s) wet. Protect dressing(s) with water repellant cover (for example, large plastic bag) or a cast cover and may then take shower. Edema Control - Lymphedema / SCD / Other: Elevate legs to the level of the heart or above for 30 minutes daily and/or  when sitting for 3-4 times a day throughout the day. Avoid standing for long periods of time. Exercise regularly ENZLEE, URSUA ([id number]) [id number] Page 6 of 7 WOUND #2: - Ankle Wound Laterality: Right, Medial Cleanser: Soap and Water 1 x Per Week/30 Days Discharge Instructions: May shower and wash wound with dial antibacterial soap and water prior to dressing change. Cleanser: Vashe 5.8 (oz) 1 x Per Week/30 Days Discharge Instructions: Cleanse the wound with Vashe prior to applying a clean dressing using gauze sponges, not tissue or cotton balls. Peri-Wound Care: Triamcinolone 15 (g) 1 x Per Week/30 Days Discharge Instructions: Use triamcinolone 15 (g) as directed Peri-Wound Care: Sween Lotion (Moisturizing lotion) 1 x Per Week/30 Days Discharge Instructions: Apply moisturizing lotion as directed Prim Dressing: Promogran Prisma Matrix, 4.34 (sq in) (silver collagen) 1 x Per Week/30 Days ary Discharge Instructions: Moisten collagen with saline or hydrogel Secondary Dressing: Zetuvit Plus Silicone Border Dressing 7x7(in/in) 1 x Per Week/30 Days Discharge Instructions:  Apply silicone border over primary dressing as directed. Com pression Wrap: Tubigrip 1 x Per Week/30 Days 1. Collagen 2. Tubigrip 3. Follow-up next week Electronic Signature(s) Signed: 02/12/2023 5:13:05 PM By: Geralyn Corwin DO Entered By: Geralyn Corwin on 02/12/2023 16:24:12 -------------------------------------------------------------------------------- HxROS Details Patient Name: Date of Service: [name], LO RNA 02/12/2023 3:45 PM Medical Record Number: [id number] Patient Account Number: [id number] Date of Birth/Sex: Treating RN: 01-18-1924 (89 y.o. F) Primary Care Provider: Ardean Larsen Other Clinician: Referring Provider: Treating Provider/Extender: [name], [name] in Treatment: 13 Respiratory Medical History: Positive for: Asthma Cardiovascular Medical History: Positive for: Hypertension Past Medical History Notes: aortic stenosis Gastrointestinal Medical History: Past Medical History Notes: rectal Ca Integumentary (Skin) Medical History: Past Medical History Notes: left lower leg cellulitis Oncologic Medical History: Negative for: Received Chemotherapy; Received Radiation Past Medical History Notes: 2008 rectal Ca Immunizations Pneumococcal Vaccine: Received Pneumococcal Vaccination: No [name] ([id number]) [id number] Page 7 of 7 Implantable Devices No devices added Hospitalization / Surgery History Type of Hospitalization/Surgery 2023 SBO, inguinal hernia, gangrene, FTT- bowel resection tonsillectomy and adenoidectomy Family and Social History Never smoker; Alcohol Use: Never; Drug Use: No History; Caffeine Use: Never; Financial Concerns: No; Food, Clothing or Shelter Needs: No; Support System Lacking: No; Transportation Concerns: No Electronic Signature(s) Signed: 02/12/2023 5:13:05 PM By: Geralyn Corwin DO Entered By: Geralyn Corwin on 02/12/2023  16:21:49 -------------------------------------------------------------------------------- SuperBill Details Patient Name: Date of Service: [name], LO RNA 02/12/2023 Medical Record Number: [id number] Patient Account Number: [id number] Date of Birth/Sex: Treating RN: [birth year] (89 y.o. 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 I10 Essential (primary) hypertension Facility Procedures : CPT4 Code: [id number] Description: 99213 - WOUND CARE VISIT-LEV 3 EST PT Modifier: Quantity: 1 Physician Procedures : CPT4 Code Description Modifier [id number] 99213 - WC PHYS LEVEL 3 - EST PT ICD-10 Diagnosis Description L97.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

## 2023-03-07 ENCOUNTER — Encounter (HOSPITAL_BASED_OUTPATIENT_CLINIC_OR_DEPARTMENT_OTHER): Payer: 59 | Admitting: Internal Medicine

## 2023-03-07 DIAGNOSIS — I87331 Chronic venous hypertension (idiopathic) with ulcer and inflammation of right lower extremity: Secondary | ICD-10-CM | POA: Diagnosis not present

## 2023-03-07 DIAGNOSIS — L97311 Non-pressure chronic ulcer of right ankle limited to breakdown of skin: Secondary | ICD-10-CM

## 2023-03-07 DIAGNOSIS — I10 Essential (primary) hypertension: Secondary | ICD-10-CM

## 2023-03-07 DIAGNOSIS — I70233 Atherosclerosis of native arteries of right leg with ulceration of ankle: Secondary | ICD-10-CM | POA: Diagnosis not present

## 2023-03-07 NOTE — Progress Notes (Signed)
[name] ([id number]) [id number] Page 1 of 7 Visit Report for 03/07/2023 Chief Complaint Document Details Patient Name: Date of ServiceTorrie Faulkner RNA 03/07/2023 3:30 PM Medical Record Number: [id number] Patient Account Number: [id number] Date of Birth/Sex: Treating RN: 1924-02-17 (89 y.o. F) Primary Care Provider: Ardean Faulkner Other Clinician: Referring Provider: Treating Provider/Extender: [name], [name] in Treatment: 16 Information Obtained from: Patient Chief Complaint 11/13/2022; patient is here for review of wounds on her right lateral and right medial ankle Electronic Signature(s) Signed: 03/07/2023 4:50:14 PM By: Geralyn Corwin DO Entered By: Geralyn Corwin on 03/07/2023 16:06:45 -------------------------------------------------------------------------------- HPI Details Patient Name: Date of Service: [name], LO RNA 03/07/2023 3:30 PM Medical Record Number: [id number] Patient Account Number: [id number] Date of Birth/Sex: Treating RN: Sep 21, 1923 (89 y.o. F) Primary Care Provider: Ardean Faulkner Other Clinician: Referring Provider: Treating Provider/Extender: [name], [name] in Treatment: 16 History of Present Illness HPI Description: ADMISSION 11/12/2021 This is a 89 year old independent woman who arrives for review of wounds on her lateral and medial ankle. It is a bit difficult to get a precise history here but it is quite clear that these have been present for many months. She states that she normally wore stockings however the wound laterally started to drain too much so she has not been wearing them. She has developed a circular erythematous area around the wounds on the lateral malleolus and her primary care doctor has given her samples of Nuzyra to use for cellulitis. Past medical history is actually very limited to hypertension, hearing loss. She had a rectal resection for cancer in  2008. In 2023 she had a small bowel obstruction. She also says she has a history of cellulitis of both legs ABIs in our clinic were difficult to obtain at 0.61 bilaterally 5/16; patient presents for follow-up. We have been using Hydrofera Blue under Kerlix/Coban to the right lower extremity. She is scheduled to have her ABIs done next week. She has no issues or complaints. She tolerated the wrap well. 5/28; patient presents for follow-up. We have been using Hydrofera Blue under Kerlix/Coban to the right lower extremity wounds. She took the wraps off to have her ABIs and TBI's completed. She has been without the wrap or dressings for the past 5 days. She has irritation to the right lateral leg. Her ABIs were noted to be 0.96 on the right with monophasic waveforms throughout and a TBI of 0.3. 6/4; right medial malleolus and just behind the right lateral malleolus. We have been using topical antibiotics and Hydrofera Blue under kerlix Coban, can compression. There was a referral I think placed to vein and vascular for follow-up of her arterial studies 6/11; patient presents for follow-up. We have been using antibiotic ointment and Hydrofera Blue under Kerlix/Coban to the right lateral and medial feet wounds. She still has not heard from vein and vascular for follow-up of arterial study results. 6/24; patient presents for follow-up. We have been using antibiotic ointment and Hydrofera Blue under Kerlix/Coban to the feet wounds. The right lateral foot wound has healed. She has been contacted by vein and vascular however patient does not want to follow-up. 7/16; patient presents for follow-up. We have been using antibiotic ointment with Hydrofera Blue under Kerlix/Coban. The medial right ankle wound is smaller. [name] ([id number]) [id number] Page 2 of 7 7/22; patient presents for follow-up. We have been using antibiotic ointment with Hydrofera Blue under Kerlix/Coban to  the medial right ankle wound. This wound is smaller. Patient  has no issues or complaints today. 7/29; patient presents for follow-up. We have been using antibiotic ointment with Hydrofera Blue under Kerlix/Coban to the medial right ankle wound. Wound is much smaller today. She has no issues or complaints. 8/6; patient presents for follow-up. We have been using collagen to the right medial ankle wound under compression wrap. Wound is smaller. She is scheduled to see vein and vascular tomorrow due to results of ABIs. 8/12; patient presents for follow-up. She saw vein and vascular on 8/7 and no need for revascularization. Recommended continuing with current wound care. Patient has no issues or complaints. We have traditionally been using collagen under her Kerlix/Coban. 8/29; patient presents for follow-up. We have been using collagen under Kerlix/Coban to the right lower extremity. Her wound is stable. Electronic Signature(s) Signed: 03/07/2023 4:50:14 PM By: Geralyn Corwin DO Entered By: Geralyn Corwin on 03/07/2023 16:07:16 -------------------------------------------------------------------------------- Physical Exam Details Patient Name: Date of ServiceTorrie Faulkner RNA 03/07/2023 3:30 PM Medical Record Number: [id number] Patient Account Number: [id number] Date of Birth/Sex: Treating RN: 10-Mar-1924 (89 y.o. F) Primary Care Provider: Ardean Faulkner Other Clinician: Referring Provider: Treating Provider/Extender: [name], [name] in Treatment: 16 Constitutional respirations regular, non-labored and within target range for patient.. Cardiovascular 2+ dorsalis pedis/posterior tibialis pulses. Psychiatric pleasant and cooperative. Notes Small open wound to the right medial malleolus with granulation tissue. No signs of infection. Significant venous stasis dermatitis and varicosities throughout the leg. Good edema control. Electronic Signature(s) Signed: 03/07/2023  4:50:14 PM By: Geralyn Corwin DO Entered By: Geralyn Corwin on 03/07/2023 16:07:59 -------------------------------------------------------------------------------- Physician Orders Details Patient Name: Date of Service: [name], LO RNA 03/07/2023 3:30 PM Medical Record Number: [id number] Patient Account Number: [id number] Date of Birth/Sex: Treating RN: 1923/12/24 (89 y.o. [name] Primary Care Provider: Ardean Faulkner Other Clinician: Referring Provider: Treating Provider/Extender: [name] in Treatment: 56 Verbal / Phone Orders: No Diagnosis Coding Follow-up Appointments ppointment in 1 week. - Dr. Mikey Bussing Room 9 Return A Anesthetic [place], [name] ([id number]) [phone].pdf Page 3 of 7 (In clinic) Topical Lidocaine 4% applied to wound bed Bathing/ Shower/ Hygiene May shower with protection but do not get wound dressing(s) wet. Protect dressing(s) with water repellant cover (for example, large plastic bag) or a cast cover and may then take shower. Edema Control - Lymphedema / SCD / Other Elevate legs to the level of the heart or above for 30 minutes daily and/or when sitting for 3-4 times a day throughout the day. Avoid standing for long periods of time. Exercise regularly Wound Treatment Wound #2 - Ankle Wound Laterality: Right, Medial Cleanser: Soap and Water 1 x Per Week/30 Days Discharge Instructions: May shower and wash wound with dial antibacterial soap and water prior to dressing change. Cleanser: Vashe 5.8 (oz) 1 x Per Week/30 Days Discharge Instructions: Cleanse the wound with Vashe prior to applying a clean dressing using gauze sponges, not tissue or cotton balls. Peri-Wound Care: Triamcinolone 15 (g) 1 x Per Week/30 Days Discharge Instructions: Use triamcinolone 15 (g) as directed Peri-Wound Care: Sween Lotion (Moisturizing lotion) 1 x Per Week/30 Days Discharge Instructions: Apply moisturizing lotion  as directed Prim Dressing: Promogran Prisma Matrix, 4.34 (sq in) (silver collagen) 1 x Per Week/30 Days ary Discharge Instructions: Moisten collagen with saline or hydrogel Secondary Dressing: Woven Gauze Sponge, Non-Sterile 4x4 in 1 x Per Week/30 Days Discharge Instructions: Apply over primary dressing as directed. Compression Wrap: Kerlix Roll 4.5x3.1 (in/yd) 1 x Per Week/30 Days Discharge Instructions: Apply Kerlix  and Coban compression as directed. Compression Wrap: Coban Self-Adherent Wrap 4x5 (in/yd) 1 x Per Week/30 Days Discharge Instructions: Apply over Kerlix as directed. Electronic Signature(s) Signed: 03/07/2023 4:50:14 PM By: Geralyn Corwin DO Entered By: Geralyn Corwin on 03/07/2023 16:08:07 -------------------------------------------------------------------------------- Problem List Details Patient Name: Date of Service: [name], LO RNA 03/07/2023 3:30 PM Medical Record Number: [id number] Patient Account Number: [id number] Date of Birth/Sex: Treating RN: Oct 18, 1923 (89 y.o. 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 11/13/2022 No Yes [name], [name] ([id number]) [id number] Page 4 of 7 I10 Essential (primary) hypertension 11/13/2022 No Yes Inactive Problems Resolved Problems Electronic Signature(s) Signed: 03/07/2023 4:50:14 PM By: Geralyn Corwin DO Entered By: Geralyn Corwin on 03/07/2023  16:06:13 -------------------------------------------------------------------------------- Progress Note Details Patient Name: Date of Service: [name], LO RNA 03/07/2023 3:30 PM Medical Record Number: [id number] Patient Account Number: [id number] Date of Birth/Sex: Treating RN: [birth year] (89 y.o. F) Primary Care Provider: Ardean Faulkner Other Clinician: Referring Provider: Treating Provider/Extender: [name], [name] in Treatment: 16 Subjective Chief Complaint Information obtained from Patient 11/13/2022; patient is here for review of wounds on her right lateral and right medial ankle History of Present Illness (HPI) ADMISSION 11/12/2021 This is a 89 year old independent woman who arrives for review of wounds on her lateral and medial ankle. It is a bit difficult to get a precise history here but it is quite clear that these have been present for many months. She states that she normally wore stockings however the wound laterally started to drain too much so she has not been wearing them. She has developed a circular erythematous area around the wounds on the lateral malleolus and her primary care doctor has given her samples of Nuzyra to use for cellulitis. Past medical history is actually very limited to hypertension, hearing loss. She had a rectal resection for cancer in 2008. In 2023 she had a small bowel obstruction. She also says she has a history of cellulitis of both legs ABIs in our clinic were difficult to obtain at 0.61 bilaterally 5/16; patient presents for follow-up. We have been using Hydrofera Blue under Kerlix/Coban to the right lower extremity. She is scheduled to have her ABIs done next week. She has no issues or complaints. She tolerated the wrap well. 5/28; patient presents for follow-up. We have been using Hydrofera Blue under Kerlix/Coban to the right lower extremity wounds. She took the wraps off to have her ABIs and TBI's completed. She has been  without the wrap or dressings for the past 5 days. She has irritation to the right lateral leg. Her ABIs were noted to be 0.96 on the right with monophasic waveforms throughout and a TBI of 0.3. 6/4; right medial malleolus and just behind the right lateral malleolus. We have been using topical antibiotics and Hydrofera Blue under kerlix Coban, can compression. There was a referral I think placed to vein and vascular for follow-up of her arterial studies 6/11; patient presents for follow-up. We have been using antibiotic ointment and Hydrofera Blue under Kerlix/Coban to the right lateral and medial feet wounds. She still has not heard from vein and vascular for  follow-up of arterial study results. 6/24; patient presents for follow-up. We have been using antibiotic ointment and Hydrofera Blue under Kerlix/Coban to the feet wounds. The right lateral foot wound has healed. She has been contacted by vein and vascular however patient does not want to follow-up. 7/16; patient presents for follow-up. We have been using antibiotic ointment with Hydrofera Blue under Kerlix/Coban. The medial right ankle wound is smaller. 7/22; patient presents for follow-up. We have been using antibiotic ointment with Hydrofera Blue under Kerlix/Coban to the medial right ankle wound. This wound is smaller. Patient has no issues or complaints today. 7/29; patient presents for follow-up. We have been using antibiotic ointment with Hydrofera Blue under Kerlix/Coban to the medial right ankle wound. Wound is much smaller today. She has no issues or complaints. 8/6; patient presents for follow-up. We have been using collagen to the right medial ankle wound under compression wrap. Wound is smaller. She is scheduled to see vein and vascular tomorrow due to results of ABIs. 8/12; patient presents for follow-up. She saw vein and vascular on 8/7 and no need for revascularization. Recommended continuing with current wound care. Patient has  no issues or complaints. We have traditionally been using collagen under her Kerlix/Coban. [name] ([id number]) [id number] Page 5 of 7 8/29; patient presents for follow-up. We have been using collagen under Kerlix/Coban to the right lower extremity. Her wound is stable. Patient History Social History Never smoker, Alcohol Use - Never, Drug Use - No History, Caffeine Use - Never. Medical History Respiratory Patient has history of Asthma Cardiovascular Patient has history of Hypertension Oncologic Denies history of Received Chemotherapy, Received Radiation Hospitalization/Surgery History - 2023 SBO, inguinal hernia, gangrene, FTT- bowel resection. - tonsillectomy and adenoidectomy. Medical A Surgical History Notes nd Cardiovascular aortic stenosis Gastrointestinal rectal Ca Integumentary (Skin) left lower leg cellulitis Oncologic 2008 rectal Ca Objective Constitutional respirations regular, non-labored and within target range for patient.. Vitals Time Taken: 3:49 PM, Height: 62 in, Weight: 120 lbs, BMI: 21.9, Temperature: 98.6 F, Pulse: 78 bpm, Respiratory Rate: 18 breaths/min, Blood Pressure: 174/77 mmHg. Cardiovascular 2+ dorsalis pedis/posterior tibialis pulses. Psychiatric pleasant and cooperative. General Notes: Small open wound to the right medial malleolus with granulation tissue. No signs of infection. Significant venous stasis dermatitis and varicosities throughout the leg. Good edema control. Integumentary (Hair, Skin) Wound #2 status is Open. Original cause of wound was Gradually Appeared. The date acquired was: 06/08/2022. The wound has been in treatment 16 Faulkner. The wound is located on the Right,Medial Ankle. The wound measures 0.1cm length x 0.1cm width x 0.1cm depth; 0.008cm^2 area and 0.001cm^3 volume. There is no tunneling or undermining noted. There is a none present amount of drainage noted. There is no granulation within the  wound bed. There is no necrotic tissue within the wound bed. 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 It is fairly small. I recommended continuing with collagen but adding some antibiotic ointment and continuing the wrap. Plan Follow-up Appointments: Return Appointment in 1 week. - Dr. Mikey Bussing Room 9 Anesthetic: [name] ([id number]) [id number] Page 6 of 7 (In clinic) Topical Lidocaine 4% applied to wound bed Bathing/ Shower/ Hygiene: May shower with protection but do not get wound dressing(s) wet. Protect dressing(s) with water repellant cover (for example, large plastic bag)  or a cast cover and may then take shower. Edema Control - Lymphedema / SCD / Other: Elevate legs to the level of the heart or above for 30 minutes daily and/or when sitting for 3-4 times a day throughout the day. Avoid standing for long periods of time. Exercise regularly WOUND #2: - Ankle Wound Laterality: Right, Medial Cleanser: Soap and Water 1 x Per Week/30 Days Discharge Instructions: May shower and wash wound with dial antibacterial soap and water prior to dressing change. Cleanser: Vashe 5.8 (oz) 1 x Per Week/30 Days Discharge Instructions: Cleanse the wound with Vashe prior to applying a clean dressing using gauze sponges, not tissue or cotton balls. Peri-Wound Care: Triamcinolone 15 (g) 1 x Per Week/30 Days Discharge Instructions: Use triamcinolone 15 (g) as directed Peri-Wound Care: Sween Lotion (Moisturizing lotion) 1 x Per Week/30 Days Discharge Instructions: Apply moisturizing lotion as directed Prim Dressing: Promogran Prisma Matrix, 4.34 (sq in) (silver collagen) 1 x Per Week/30  Days ary Discharge Instructions: Moisten collagen with saline or hydrogel Secondary Dressing: Woven Gauze Sponge, Non-Sterile 4x4 in 1 x Per Week/30 Days Discharge Instructions: Apply over primary dressing as directed. Com pression Wrap: Kerlix Roll 4.5x3.1 (in/yd) 1 x Per Week/30 Days Discharge Instructions: Apply Kerlix and Coban compression as directed. Com pression Wrap: Coban Self-Adherent Wrap 4x5 (in/yd) 1 x Per Week/30 Days Discharge Instructions: Apply over Kerlix as directed. 1. Collagen with antibiotic ointment under Kerlix/Coban to the right lower extremity 2. Follow-up in 1 week Electronic Signature(s) Signed: 03/07/2023 4:50:14 PM By: Geralyn Corwin DO Entered By: Geralyn Corwin on 03/07/2023 16:08:53 -------------------------------------------------------------------------------- HxROS Details Patient Name: Date of Service: [name], LO RNA 03/07/2023 3:30 PM Medical Record Number: [id number] Patient Account Number: [id number] Date of Birth/Sex: Treating RN: 1923/10/21 (89 y.o. F) Primary Care Provider: Ardean Faulkner Other Clinician: Referring Provider: Treating Provider/Extender: [name], [name] in Treatment: 16 Respiratory Medical History: Positive for: Asthma Cardiovascular Medical History: Positive for: Hypertension Past Medical History Notes: aortic stenosis Gastrointestinal Medical History: Past Medical History Notes: rectal Ca Integumentary (Skin) Medical History: Past Medical History Notes: left lower leg cellulitis Oncologic Medical History: Negative for: Received Chemotherapy; Received Radiation Past Medical History NotesMarland Kitchen [name], [name] ([id number]) [id number] Page 7 of 7 2008 rectal Ca Immunizations Pneumococcal Vaccine: Received Pneumococcal Vaccination: No Implantable Devices No devices added Hospitalization / Surgery History Type of Hospitalization/Surgery 2023 SBO, inguinal hernia,  gangrene, FTT- bowel resection tonsillectomy and adenoidectomy Family and Social History Never smoker; Alcohol Use: Never; Drug Use: No History; Caffeine Use: Never; Financial Concerns: No; Food, Clothing or Shelter Needs: No; Support System Lacking: No; Transportation Concerns: No Electronic Signature(s) Signed: 03/07/2023 4:50:14 PM By: Geralyn Corwin DO Entered By: Geralyn Corwin on 03/07/2023 16:07:33 -------------------------------------------------------------------------------- SuperBill Details Patient Name: Date of Service: [name], LO RNA 03/07/2023 Medical Record Number: [id number] Patient Account Number: [id number] Date of Birth/Sex: Treating RN: [birth year] (89 y.o. 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 I10 Essential (primary) hypertension Facility Procedures : CPT4 Code: [id number] Description: 99213 - WOUND CARE VISIT-LEV 3 EST PT Modifier: Quantity: 1 Physician Procedures : CPT4 Code Description Modifier [id number] - WC PHYS LEVEL 3 - EST PT ICD-10 Diagnosis Description  L97.311 Non-pressure chronic ulcer of right ankle limited to breakdown of skin I87.331 Chronic venous hypertension (idiopathic) with ulcer and  inflammation of right lower extremity I70.233 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

## 2023-03-07 NOTE — Progress Notes (Signed)
Wardner, [name] ([id number]) [id number] Page 1 of 9 Visit Report for 03/07/2023 Arrival Information Details Patient Name: Date of ServiceCorinna Faulkner, [name] RNA 03/07/2023 3:30 PM Medical Record Number: [id number] Patient Account Number: [id number] Date of Birth/Sex: Treating RN: [birth year] (89 y.o. [name] Primary Care Taneah Masri: Ardean Larsen Other Clinician: Referring Torian Thoennes: Treating Danisa Kopec/Extender: [name]: 16 Visit Information History Since Last Visit Added or deleted any medications: No Patient Arrived: Ambulatory Any new allergies or adverse reactions: No Arrival Time: 15:48 Had a fall or experienced change in No Accompanied By: daughter activities of daily living that may affect Transfer Assistance: None risk of falls: Patient Identification Verified: Yes Signs or symptoms of abuse/neglect since last visito No Secondary Verification Process Completed: Yes Hospitalized since last visit: No Patient Requires Transmission-Based Precautions: No Implantable device outside of the clinic excluding No Patient Has Alerts: Yes cellular tissue based products placed in the center Patient Alerts: ABI R 0.96 (11/29/22) since last visit: ABI L 1.25 (11/29/22) Has Dressing in Place as Prescribed: Yes Has Compression in Place as Prescribed: Yes Pain Present Now: No Electronic Signature(s) Signed: 03/07/2023 4:52:01 PM By: Redmond Pulling RN, BSN Entered By: Redmond Pulling on 03/07/2023 15:49:15 -------------------------------------------------------------------------------- Clinic Level of Care Assessment Details Patient Name: Date of ServiceCorinna Faulkner, [name] RNA 03/07/2023 3:30 PM Medical Record Number: [id number] Patient Account Number: [id number] Date of Birth/Sex: Treating RN: 10-04-1923 (89 y.o. [name] Primary Care Aarav Burgett: Ardean Larsen Other Clinician: Referring Daphyne Miguez: Treating  Atilla Zollner/Extender: [name]: 16 Clinic Level of Care Assessment Items TOOL 4 Quantity Score X- 1 0 Use when only an EandM is performed on FOLLOW-UP visit ASSESSMENTS - Nursing Assessment / Reassessment X- 1 10 Reassessment of Co-morbidities (includes updates in patient status) X- 1 5 Reassessment of Adherence to Treatment Plan ASSESSMENTS - Wound and Skin A ssessment / Reassessment X - Simple Wound Assessment / Reassessment - one wound 1 5 []  - 0 Complex Wound Assessment / Reassessment - multiple wounds []  - 0 Dermatologic / Skin Assessment (not related to wound area) ASSESSMENTS - Focused Assessment X- 1 5 Circumferential Edema Measurements - multi extremities []  - 0 Nutritional Assessment / Counseling / Intervention [name] ([id number]) [id number] Page 2 of 9 []  - 0 Lower Extremity Assessment (monofilament, tuning fork, pulses) []  - 0 Peripheral Arterial Disease Assessment (using hand held doppler) ASSESSMENTS - Ostomy and/or Continence Assessment and Care []  - 0 Incontinence Assessment and Management []  - 0 Ostomy Care Assessment and Management (repouching, etc.) PROCESS - Coordination of Care X - Simple Patient / Family Education for ongoing care 1 15 []  - 0 Complex (extensive) Patient / Family Education for ongoing care X- 1 10 Staff obtains Chiropractor, Records, T Results / Process Orders est []  - 0 Staff telephones HHA, Nursing Homes / Clarify orders / etc []  - 0 Routine Transfer to another Facility (non-emergent condition) []  - 0 Routine Hospital Admission (non-emergent condition) []  - 0 New Admissions / Manufacturing engineer / Ordering NPWT Apligraf, etc. , []  - 0 Emergency Hospital Admission (emergent condition) []  - 0 Simple Discharge Coordination []  - 0 Complex (extensive) Discharge Coordination PROCESS - Special Needs []  - 0 Pediatric / Minor Patient Management []  -  0 Isolation Patient Management []  - 0 Hearing / Language / Visual special needs []  - 0 Assessment of Community assistance (transportation, D/C planning, etc.) []  - 0 Additional assistance / Altered mentation []  - 0 Support Surface(s) Assessment (  bed, cushion, seat, etc.) INTERVENTIONS - Wound Cleansing / Measurement X - Simple Wound Cleansing - one wound 1 5 []  - 0 Complex Wound Cleansing - multiple wounds X- 1 5 Wound Imaging (photographs - any number of wounds) []  - 0 Wound Tracing (instead of photographs) X- 1 5 Simple Wound Measurement - one wound []  - 0 Complex Wound Measurement - multiple wounds INTERVENTIONS - Wound Dressings []  - 0 Small Wound Dressing one or multiple wounds []  - 0 Medium Wound Dressing one or multiple wounds X- 1 20 Large Wound Dressing one or multiple wounds X- 1 5 Application of Medications - topical []  - 0 Application of Medications - injection INTERVENTIONS - Miscellaneous []  - 0 External ear exam []  - 0 Specimen Collection (cultures, biopsies, blood, body fluids, etc.) []  - 0 Specimen(s) / Culture(s) sent or taken to Lab for analysis []  - 0 Patient Transfer (multiple staff / Nurse, adult / Similar devices) []  - 0 Simple Staple / Suture removal (25 or less) []  - 0 Complex Staple / Suture removal (26 or more) []  - 0 Hypo / Hyperglycemic Management (close monitor of Blood Glucose) [name] ([id number]) [id number] Page 3 of 9 []  - 0 Ankle / Brachial Index (ABI) - do not check if billed separately X- 1 5 Vital Signs Has the patient been seen at the hospital within the last three years: Yes Total Score: 95 Level Of Care: New/Established - Level 3 Electronic Signature(s) Signed: 03/07/2023 4:52:01 PM By: Redmond Pulling RN, BSN Entered By: Redmond Pulling on 03/07/2023 16:25:56 -------------------------------------------------------------------------------- Encounter Discharge Information Details Patient  Name: Date of Service: [name], [place] RNA 03/07/2023 3:30 PM Medical Record Number: [id number] Patient Account Number: [id number] Date of Birth/Sex: Treating RN: 1923/07/13 (89 y.o. [name] Primary Care Lillyen Schow: Ardean Larsen Other Clinician: Referring Sandeep Radell: Treating Manford Sprong/Extender: [name]: 80 Encounter Discharge Information Items Discharge Condition: Stable Ambulatory Status: Ambulatory Discharge Destination: Home Transportation: Private Auto Accompanied By: daughter Schedule Follow-up Appointment: Yes Clinical Summary of Care: Patient Declined Electronic Signature(s) Signed: 03/07/2023 4:52:01 PM By: Redmond Pulling RN, BSN Entered By: Redmond Pulling on 03/07/2023 16:26:45 -------------------------------------------------------------------------------- Lower Extremity Assessment Details Patient Name: Date of Service: [name], [place] RNA 03/07/2023 3:30 PM Medical Record Number: [id number] Patient Account Number: [id number] Date of Birth/Sex: Treating RN: [birth year] (89 y.o. [name] Primary Care Deagan Sevin: Ardean Larsen Other Clinician: Referring Shenika Quint: Treating Takiesha Mcdevitt/Extender: [name], [name] in Treatment: 16 Edema Assessment Assessed: [Left: No] [Right: No] Edema: [Left: N] [Right: o] Calf Left: Right: Point of Measurement: 31 cm From Medial Instep 28.8 cm Ankle Left: Right: Point of Measurement: 10 cm From Medial Instep 20.2 cm Vascular Assessment Left: [[id number] Page 4 of 9Right:] Pulses: Dorsalis Pedis Palpable: [phone].pdf Page 4 of 9Yes] Extremity colors, hair growth, and conditions: Extremity Color: [phone].pdf Page 4 of 9Hyperpigmented] Hair Growth on Extremity: [phone].pdf Page 4 of 9No] Temperature of Extremity: [phone].pdf Page 4 of  9Warm] Capillary Refill: [phone].pdf Page 4 of 9< 3 seconds] Dependent Rubor: [phone].pdf Page 4 of 9No No] Electronic Signature(s) Signed: 03/07/2023 4:52:01 PM By: Redmond Pulling RN, BSN Entered By: Redmond Pulling on 03/07/2023 15:50:25 -------------------------------------------------------------------------------- Multi Wound Chart Details Patient Name: Date of Service: [name], [place] RNA 03/07/2023 3:30 PM Medical Record Number: [id number] Patient Account Number: [id number] Date of Birth/Sex: Treating RN: 1923-08-28 (89 y.o. F) Primary Care Arleatha Philipps: Ardean Larsen Other Clinician: Referring Carola Viramontes: Treating Bridgitte Felicetti/Extender: [name], [name] in Treatment: 16 Vital Signs  Height(in): 62 Pulse(bpm): 78 Weight(lbs): 120 Blood Pressure(mmHg): 174/77 Body Mass Index(BMI): 21.9 Temperature(F): 98.6 Respiratory Rate(breaths/min): 18 [2:Photos:] [N/A:N/A] Right, Medial Ankle N/A N/A Wound Location: Gradually Appeared N/A N/A Wounding Event: Venous Leg Ulcer N/A N/A Primary Etiology: Asthma, Hypertension N/A N/A Comorbid History: 06/08/2022 N/A N/A Date Acquired: 3 N/A N/A Weeks of Treatment: Open N/A N/A Wound Status: No N/A N/A Wound Recurrence: 0.1x0.1x0.1 N/A N/A Measurements L x W x D (cm) 0.008 N/A N/A A (cm) : rea 0.001 N/A N/A Volume (cm) : 93.70% N/A N/A % Reduction in A rea: 92.30% N/A N/A % Reduction in Volume: Full Thickness Without Exposed N/A N/A Classification: Support Structures None Present N/A N/A Exudate Amount: None Present (0%) N/A N/A Granulation Amount: None Present (0%) N/A N/A Necrotic Amount: Fascia: No N/A N/A Exposed Structures: Fat Layer (Subcutaneous Tissue): No Tendon: No Muscle: No Joint: No Bone: No None N/A N/A EpithelializationSherre Faulkner ([id number]) [id number] Page 5 of 9 Treatment Notes Electronic  Signature(s) Signed: 03/07/2023 4:50:14 PM By: Geralyn Corwin DO Entered By: Geralyn Corwin on 03/07/2023 16:06:19 -------------------------------------------------------------------------------- Multi-Disciplinary Care Plan Details Patient Name: Date of Service: [name], [name] RNA 03/07/2023 3:30 PM Medical Record Number: [id number] Patient Account Number: [id number] Date of Birth/Sex: Treating RN: 11-10-1923 (89 y.o. [name] Primary Care Yasheka Fossett: Ardean Larsen Other Clinician: Referring Tiffancy Moger: Treating Anthonia Monger/Extender: [name], [name] in Treatment: 30 Active Inactive Pain, Acute or Chronic Nursing Diagnoses: Pain, acute or chronic: actual or potential Potential alteration in comfort, pain Goals: Patient will verbalize adequate pain control and receive pain control interventions during procedures as needed Date Initiated: 11/13/2022 Date Inactivated: 12/18/2022 Target Resolution Date: 02/06/2023 Goal Status: Met Patient/caregiver will verbalize comfort level met Date Initiated: 11/13/2022 Target Resolution Date: 03/08/2023 Goal Status: Active Interventions: Assess comfort goal upon admission Encourage patient to take pain medications as prescribed Treatment Activities: Administer pain control measures as ordered : 11/13/2022 Notes: Venous Leg Ulcer Nursing Diagnoses: Knowledge deficit related to disease process and management Goals: Patient will maintain optimal edema control Date Initiated: 11/13/2022 Target Resolution Date: 03/08/2023 Goal Status: Active Interventions: Assess peripheral edema status every visit. Compression as ordered Provide education on venous insufficiency Notes: Wound/Skin Impairment Nursing Diagnoses: Knowledge deficit related to ulceration/compromised skin integrity Goals: Patient/caregiver will verbalize understanding of skin care regimen Date Initiated: 11/13/2022 Target Resolution Date: 03/08/2023 Goal Status:  Active [name], [name] ([id number]) [id number] Page 6 of 9 Interventions: Assess patient/caregiver ability to perform ulcer/skin care regimen upon admission and as needed Assess ulceration(s) every visit Provide education on ulcer and skin care Treatment Activities: Skin care regimen initiated : 11/13/2022 Topical wound management initiated : 11/13/2022 Notes: Electronic Signature(s) Signed: 03/07/2023 4:52:01 PM By: Redmond Pulling RN, BSN Entered By: Redmond Pulling on 03/07/2023 15:54:08 -------------------------------------------------------------------------------- Pain Assessment Details Patient Name: Date of Service: [name], [place] RNA 03/07/2023 3:30 PM Medical Record Number: [id number] Patient Account Number: [id number] Date of Birth/Sex: Treating RN: February 06, 1924 (89 y.o. [name] Primary Care Kaleeya Hancock: Ardean Larsen Other Clinician: Referring Varonica Siharath: Treating Fanta Wimberley/Extender: [name], [name] in Treatment: 16 Active Problems Location of Pain Severity and Description of Pain Patient Has Paino No Site Locations Pain Management and Medication Current Pain Management: Electronic Signature(s) Signed: 03/07/2023 4:52:01 PM By: Redmond Pulling RN, BSN Entered By: Redmond Pulling on 03/07/2023 15:50:19 Patient/Caregiver Education Details -------------------------------------------------------------------------------- [name] ([id number]) [id number] Page 7 of 9 Patient Name: Date of ServiceTorrie Faulkner RNA 8/29/2024andnbsp3:30 PM Medical Record Number: [id number] Patient Account Number: [id number] Date  of Birth/Gender: Treating RN: [birth year] (89 y.o. [name] Primary Care Physician: Ardean Larsen Other Clinician: Referring Physician: Treating Physician/Extender: [name]: 16 Education Assessment Education Provided To: Patient Education  Topics Provided Wound/Skin Impairment: Methods: Explain/Verbal Responses: State content correctly [employer]) Signed: 03/07/2023 4:52:01 PM By: Redmond Pulling RN, BSN Entered By: Redmond Pulling on 03/07/2023 15:53:11 -------------------------------------------------------------------------------- Wound Assessment Details Patient Name: Date of Service: [name], [place] RNA 03/07/2023 3:30 PM Medical Record Number: [id number] Patient Account Number: [id number] Date of Birth/Sex: Treating RN: January 30, 1924 (89 y.o. [name] Primary Care Rosamond Andress: Ardean Larsen Other Clinician: Referring Issiah Huffaker: Treating Deepak Bless/Extender: [name], [name] in Treatment: 16 Wound Status Wound Number: 2 Primary Etiology: Venous Leg Ulcer Wound Location: Right, Medial Ankle Wound Status: Open Wounding Event: Gradually Appeared Comorbid History: Asthma, Hypertension Date Acquired: 06/08/2022 Weeks Of Treatment: 16 Clustered Wound: No Photos Wound Measurements Length: (cm) 0.1 Width: (cm) 0.1 Depth: (cm) 0.1 Area: (cm) 0.008 Volume: (cm) 0.001 % Reduction in Area: 93.7% % Reduction in Volume: 92.3% Epithelialization: None Tunneling: No Undermining: No Wound Description Classification: Full Thickness Without Exposed Support Structures Exudate Amount: None Present [name], [name] ([id number]) Foul Odor After Cleansing: No Slough/Fibrino No [id number] Page 8 of 9 Wound Bed Granulation Amount: None Present (0%) Exposed Structure Necrotic Amount: None Present (0%) Fascia Exposed: No Fat Layer (Subcutaneous Tissue) Exposed: No Tendon Exposed: No Muscle Exposed: No Joint Exposed: No Bone Exposed: No Periwound Skin Texture Texture Color No Abnormalities Noted: No No Abnormalities Noted: No Moisture No Abnormalities Noted: No Treatment Notes Wound #2 (Ankle) Wound Laterality: Right, Medial Cleanser Soap and Water Discharge  Instruction: May shower and wash wound with dial antibacterial soap and water prior to dressing change. Vashe 5.8 (oz) Discharge Instruction: Cleanse the wound with Vashe prior to applying a clean dressing using gauze sponges, not tissue or cotton balls. Peri-Wound Care Triamcinolone 15 (g) Discharge Instruction: Use triamcinolone 15 (g) as directed Sween Lotion (Moisturizing lotion) Discharge Instruction: Apply moisturizing lotion as directed Topical Primary Dressing Promogran Prisma Matrix, 4.34 (sq in) (silver collagen) Discharge Instruction: Moisten collagen with saline or hydrogel Secondary Dressing Woven Gauze Sponge, Non-Sterile 4x4 in Discharge Instruction: Apply over primary dressing as directed. Secured With Compression Wrap Kerlix Roll 4.5x3.1 (in/yd) Discharge Instruction: Apply Kerlix and Coban compression as directed. Coban Self-Adherent Wrap 4x5 (in/yd) Discharge Instruction: Apply over Kerlix as directed. Compression Stockings Add-Ons Electronic Signature(s) Signed: 03/07/2023 4:52:01 PM By: Redmond Pulling RN, BSN Entered By: Redmond Pulling on 03/07/2023 15:56:33 -------------------------------------------------------------------------------- Vitals Details Patient Name: Date of Service: [name], [place] RNA 03/07/2023 3:30 PM Medical Record Number: [id number] Patient Account Number: [id number] Date of Birth/Sex: Treating RN: 01/29/1924 (89 y.o. [name] Primary Care Carilyn Woolston: Ardean Larsen Other Clinician: Referring Arieal Cuoco: Treating Verina Galeno/Extender: [name], [name] in Treatment: [address] [place] ([id number]) [id number] Page 9 of 9 Vital Signs Time Taken: 15:49 Temperature (F): 98.6 Height (in): 62 Pulse (bpm): 78 Weight (lbs): 120 Respiratory Rate (breaths/min): 18 Body Mass Index (BMI): 21.9 Blood Pressure (mmHg): 174/77 Reference Range: 80 - 120 mg / dl Electronic Signature(s) Signed: 03/07/2023  4:52:01 PM By: Redmond Pulling RN, BSN Entered By: Redmond Pulling on 03/07/2023 15:50:09

## 2023-03-14 ENCOUNTER — Encounter (HOSPITAL_BASED_OUTPATIENT_CLINIC_OR_DEPARTMENT_OTHER): Payer: 59 | Attending: Internal Medicine | Admitting: Internal Medicine

## 2023-03-14 DIAGNOSIS — Z85048 Personal history of other malignant neoplasm of rectum, rectosigmoid junction, and anus: Secondary | ICD-10-CM | POA: Insufficient documentation

## 2023-03-14 DIAGNOSIS — L97311 Non-pressure chronic ulcer of right ankle limited to breakdown of skin: Secondary | ICD-10-CM | POA: Insufficient documentation

## 2023-03-14 DIAGNOSIS — I10 Essential (primary) hypertension: Secondary | ICD-10-CM | POA: Diagnosis not present

## 2023-03-14 DIAGNOSIS — I70233 Atherosclerosis of native arteries of right leg with ulceration of ankle: Secondary | ICD-10-CM | POA: Diagnosis not present

## 2023-03-14 DIAGNOSIS — I87331 Chronic venous hypertension (idiopathic) with ulcer and inflammation of right lower extremity: Secondary | ICD-10-CM | POA: Insufficient documentation

## 2023-03-15 NOTE — Progress Notes (Signed)
[name] ([id number]) [id number] Page 1 of 7 Visit Report for 03/14/2023 Chief Complaint Document Details Patient Name: Date of Service: [name], [name] RNA 03/14/2023 2:45 PM Medical Record Number: [id number] Patient Account Number: [id number] Date of Birth/Sex: Treating RN: 09/08/1923 (89 y.o. F) Primary Care Provider: Ardean Larsen Other Clinician: Referring Provider: Treating Provider/Extender: [name], [name] in Treatment: 17 Information Obtained from: Patient Chief Complaint 11/13/2022; patient is here for review of wounds on her right lateral and right medial ankle Electronic Signature(s) Signed: 03/14/2023 5:29:38 PM By: Geralyn Corwin DO Entered By: Geralyn Corwin on 03/14/2023 14:01:17 -------------------------------------------------------------------------------- HPI Details Patient Name: Date of Service: [name], [place] RNA 03/14/2023 2:45 PM Medical Record Number: [id number] Patient Account Number: [id number] Date of Birth/Sex: Treating RN: [birth year] (89 y.o. F) Primary Care Provider: Ardean Larsen Other Clinician: Referring Provider: Treating Provider/Extender: [name], [name] in Treatment: 17 History of Present Illness HPI Description: ADMISSION 11/12/2021 This is a 89 year old independent woman who arrives for review of wounds on her lateral and medial ankle. It is a bit difficult to get a precise history here but it is quite clear that these have been present for many months. She states that she normally wore stockings however the wound laterally started to drain too much so she has not been wearing them. She has developed a circular erythematous area around the wounds on the lateral malleolus and her primary care doctor has given her samples of Nuzyra to use for cellulitis. Past medical history is actually very limited to hypertension, hearing loss. She had a rectal resection for cancer in 2008. In  2023 she had a small bowel obstruction. She also says she has a history of cellulitis of both legs ABIs in our clinic were difficult to obtain at 0.61 bilaterally 5/16; patient presents for follow-up. We have been using Hydrofera Blue under Kerlix/Coban to the right lower extremity. She is scheduled to have her ABIs done next week. She has no issues or complaints. She tolerated the wrap well. 5/28; patient presents for follow-up. We have been using Hydrofera Blue under Kerlix/Coban to the right lower extremity wounds. She took the wraps off to have her ABIs and TBI's completed. She has been without the wrap or dressings for the past 5 days. She has irritation to the right lateral leg. Her ABIs were noted to be 0.96 on the right with monophasic waveforms throughout and a TBI of 0.3. 6/4; right medial malleolus and just behind the right lateral malleolus. We have been using topical antibiotics and Hydrofera Blue under kerlix Coban, can compression. There was a referral I think placed to vein and vascular for follow-up of her arterial studies 6/11; patient presents for follow-up. We have been using antibiotic ointment and Hydrofera Blue under Kerlix/Coban to the right lateral and medial feet wounds. She still has not heard from vein and vascular for follow-up of arterial study results. 6/24; patient presents for follow-up. We have been using antibiotic ointment and Hydrofera Blue under Kerlix/Coban to the feet wounds. The right lateral foot wound has healed. She has been contacted by vein and vascular however patient does not want to follow-up. 7/16; patient presents for follow-up. We have been using antibiotic ointment with Hydrofera Blue under Kerlix/Coban. The medial right ankle wound is smaller. [name] ([id number]) [id number] Page 2 of 7 7/22; patient presents for follow-up. We have been using antibiotic ointment with Hydrofera Blue under Kerlix/Coban to the  medial right ankle wound. This wound is smaller. Patient  has no issues or complaints today. 7/29; patient presents for follow-up. We have been using antibiotic ointment with Hydrofera Blue under Kerlix/Coban to the medial right ankle wound. Wound is much smaller today. She has no issues or complaints. 8/6; patient presents for follow-up. We have been using collagen to the right medial ankle wound under compression wrap. Wound is smaller. She is scheduled to see vein and vascular tomorrow due to results of ABIs. 8/12; patient presents for follow-up. She saw vein and vascular on 8/7 and no need for revascularization. Recommended continuing with current wound care. Patient has no issues or complaints. We have traditionally been using collagen under her Kerlix/Coban. 8/29; patient presents for follow-up. We have been using collagen under Kerlix/Coban to the right lower extremity. Her wound is stable. 9/5; patient presents for follow-up. We have been using collagen under Kerlix/Coban to the right lower extremity. Wound is healed. She has compression stockings at home. Electronic Signature(s) Signed: 03/14/2023 5:29:38 PM By: Geralyn Corwin DO Entered By: Geralyn Corwin on 03/14/2023 14:01:59 -------------------------------------------------------------------------------- Physical Exam Details Patient Name: Date of ServiceTorrie Faulkner RNA 03/14/2023 2:45 PM Medical Record Number: [id number] Patient Account Number: [id number] Date of Birth/Sex: Treating RN: 1923-08-01 (89 y.o. F) Primary Care Provider: Ardean Larsen Other Clinician: Referring Provider: Treating Provider/Extender: [name], [name] in Treatment: 17 Constitutional respirations regular, non-labored and within target range for patient.. Cardiovascular 2+ dorsalis pedis/posterior tibialis pulses. Psychiatric pleasant and cooperative. Notes Small scab to the right medial malleolus. Appears well-healed.  Significant venous stasis dermatitis and varicosities throughout the leg. Good edema control. Electronic Signature(s) Signed: 03/14/2023 5:29:38 PM By: Geralyn Corwin DO Entered By: Geralyn Corwin on 03/14/2023 14:02:42 -------------------------------------------------------------------------------- Physician Orders Details Patient Name: Date of Service: [name], [place] RNA 03/14/2023 2:45 PM Medical Record Number: [id number] Patient Account Number: [id number] Date of Birth/Sex: Treating RN: 04-16-1924 (89 y.o. [name] Primary Care Provider: Ardean Larsen Other Clinician: Referring Provider: Treating Provider/Extender: [name]: 94 Verbal / Phone Orders: No Diagnosis Coding Discharge From [facility] Discharge from [facility] - Congratulations!!! [place], [place] ([id number]) [id number] Page 3 of 7 Anesthetic (In clinic) Topical Lidocaine 4% applied to wound bed Bathing/ Shower/ Hygiene May shower and wash wound with soap and water. Edema Control - Lymphedema / SCD / Other Elevate legs to the level of the heart or above for 30 minutes daily and/or when sitting for 3-4 times a day throughout the day. Avoid standing for long periods of time. Patient to wear own compression stockings every day. Exercise regularly Moisturize legs daily. Electronic Signature(s) Signed: 03/14/2023 5:29:38 PM By: Geralyn Corwin DO Previous Signature: 03/14/2023 3:54:16 PM Version By: Redmond Pulling RN, BSN Entered By: Geralyn Corwin on 03/14/2023 14:02:50 -------------------------------------------------------------------------------- Problem List Details Patient Name: Date of Service: [name], [place] RNA 03/14/2023 2:45 PM Medical Record Number: [id number] Patient Account Number: [id number] Date of Birth/Sex: Treating RN: 07/18/1923 (89 y.o. 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 11/13/2022 No Yes I10 Essential (primary) hypertension 11/13/2022 No Yes Inactive Problems Resolved Problems Electronic Signature(s) Signed: 03/14/2023 5:29:38 PM By: Mikey Bussing,  Shanda Bumps DO Entered By: Geralyn Corwin on 03/14/2023 14:01:04 [name] ([id number]) [id number] Page 4 of 7 -------------------------------------------------------------------------------- Progress Note Details Patient Name: Date of ServiceTorrie Faulkner RNA 03/14/2023 2:45 PM Medical Record Number: [id number] Patient Account Number: [id number] Date of Birth/Sex: Treating RN: [birth year] (89 y.o. F) Primary Care Provider: Ardean Larsen Other Clinician: Referring Provider: Treating Provider/Extender: [name], [name] in Treatment: 17 Subjective Chief Complaint Information obtained from Patient 11/13/2022; patient is here for review of wounds on her right lateral and right medial ankle History of Present Illness (HPI) ADMISSION 11/12/2021 This is a 89 year old independent woman who arrives for review of wounds on her lateral and medial ankle. It is a bit difficult to get a precise history here but it is quite clear that these have been present for many months. She states that she normally wore stockings however the wound laterally started to drain too much so she has not been wearing them. She has developed a circular erythematous area around the wounds on the lateral malleolus and her primary care doctor has  given her samples of Nuzyra to use for cellulitis. Past medical history is actually very limited to hypertension, hearing loss. She had a rectal resection for cancer in 2008. In 2023 she had a small bowel obstruction. She also says she has a history of cellulitis of both legs ABIs in our clinic were difficult to obtain at 0.61 bilaterally 5/16; patient presents for follow-up. We have been using Hydrofera Blue under Kerlix/Coban to the right lower extremity. She is scheduled to have her ABIs done next week. She has no issues or complaints. She tolerated the wrap well. 5/28; patient presents for follow-up. We have been using Hydrofera Blue under Kerlix/Coban to the right lower extremity wounds. She took the wraps off to have her ABIs and TBI's completed. She has been without the wrap or dressings for the past 5 days. She has irritation to the right lateral leg. Her ABIs were noted to be 0.96 on the right with monophasic waveforms throughout and a TBI of 0.3. 6/4; right medial malleolus and just behind the right lateral malleolus. We have been using topical antibiotics and Hydrofera Blue under kerlix Coban, can compression. There was a referral I think placed to vein and vascular for follow-up of her arterial studies 6/11; patient presents for follow-up. We have been using antibiotic ointment and Hydrofera Blue under Kerlix/Coban to the right lateral and medial feet wounds. She still has not heard from vein and vascular for follow-up of arterial study results. 6/24; patient presents for follow-up. We have been using antibiotic ointment and Hydrofera Blue under Kerlix/Coban to the feet wounds. The right lateral foot wound has healed. She has been contacted by vein and vascular however patient does not want to follow-up. 7/16; patient presents for follow-up. We have been using antibiotic ointment with Hydrofera Blue under Kerlix/Coban. The medial right ankle wound is smaller. 7/22; patient presents for  follow-up. We have been using antibiotic ointment with Hydrofera Blue under Kerlix/Coban to the medial right ankle wound. This wound is smaller. Patient has no issues or complaints today. 7/29; patient presents for follow-up. We have been using antibiotic ointment with Hydrofera Blue under Kerlix/Coban to the medial right ankle wound. Wound is much smaller today. She has no issues or complaints. 8/6; patient presents for follow-up. We have been using collagen to the right medial ankle wound under compression wrap. Wound is smaller. She is scheduled to see vein and vascular tomorrow due  to results of ABIs. 8/12; patient presents for follow-up. She saw vein and vascular on 8/7 and no need for revascularization. Recommended continuing with current wound care. Patient has no issues or complaints. We have traditionally been using collagen under her Kerlix/Coban. 8/29; patient presents for follow-up. We have been using collagen under Kerlix/Coban to the right lower extremity. Her wound is stable. 9/5; patient presents for follow-up. We have been using collagen under Kerlix/Coban to the right lower extremity. Wound is healed. She has compression stockings at home. Patient History Social History Never smoker, Alcohol Use - Never, Drug Use - No History, Caffeine Use - Never. Medical History Respiratory Patient has history of Asthma Cardiovascular Patient has history of Hypertension Oncologic Denies history of Received Chemotherapy, Received Radiation Hospitalization/Surgery History - 2023 SBO, inguinal hernia, gangrene, FTT- bowel resection. - tonsillectomy and adenoidectomy. Medical A Surgical History Notes nd Cardiovascular aortic stenosis Gastrointestinal rectal Ca Integumentary (Skin) left lower leg cellulitis Oncologic [address] [place], [place] ([id number]) [id number] Page 5 of 7 Objective Constitutional respirations regular, non-labored and within  target range for patient.. Vitals Time Taken: 2:44 PM, Height: 62 in, Weight: 120 lbs, BMI: 21.9, Temperature: 98.0 F, Pulse: 81 bpm, Respiratory Rate: 18 breaths/min, Blood Pressure: 145/73 mmHg. Cardiovascular 2+ dorsalis pedis/posterior tibialis pulses. Psychiatric pleasant and cooperative. General Notes: Small scab to the right medial malleolus. Appears well-healed. Significant venous stasis dermatitis and varicosities throughout the leg. Good edema control. Integumentary (Hair, Skin) Wound #2 status is Healed - Epithelialized. Original cause of wound was Gradually Appeared. The date acquired was: 06/08/2022. The wound has been in treatment 17 weeks. The wound is located on the Right,Medial Ankle. The wound measures 0cm length x 0cm width x 0cm depth; 0cm^2 area and 0cm^3 volume. There is no tunneling or undermining noted. There is a none present amount of drainage noted. There is no granulation within the wound bed. There is no necrotic tissue within the wound bed. The periwound skin appearance exhibited: Erythema. The surrounding wound skin color is noted with erythema which is circumferential. Periwound temperature was noted as No Abnormality. 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 Her wound On her right medial ankle is healed. I recommend she wear her compression stockings daily. We gave her Tubigrip in office to use temporarily. Follow-up as needed. Plan Discharge From [facility] Services: [facility] - Congratulations!!! Anesthetic: (In clinic) Topical Lidocaine 4% applied to wound bed Bathing/ Shower/ Hygiene: May shower and wash wound with  soap and water. Edema Control - Lymphedema / SCD / Other: Elevate legs to the level of the heart or above for 30 minutes daily and/or when sitting for 3-4 times a day throughout the day. Avoid standing for long periods of time. Patient to wear own compression stockings every day. Exercise regularly Moisturize legs daily. 1. Tubigrip daily until she starts her compression stockings daily 2. Follow-up as needed 3. Discharge from clinic due to closed wound Electronic Signature(s) Signed: 03/14/2023 5:29:38 PM By: Geralyn Corwin DO Entered By: Geralyn Corwin on 03/14/2023 14:12:13 [name] ([id number]) [id number] Page 6 of 7 -------------------------------------------------------------------------------- HxROS Details Patient Name: Date of ServiceTorrie Faulkner RNA 03/14/2023 2:45 PM Medical Record Number: [id number] Patient Account Number: [id number] Date of Birth/Sex: Treating  RN: 1924-07-07 (89 y.o. F) Primary Care Provider: Ardean Larsen Other Clinician: Referring Provider: Treating Provider/Extender: [name], [name] in Treatment: 17 Respiratory Medical History: Positive for: Asthma Cardiovascular Medical History: Positive for: Hypertension Past Medical History Notes: aortic stenosis Gastrointestinal Medical History: Past Medical History Notes: rectal Ca Integumentary (Skin) Medical History: Past Medical History Notes: left lower leg cellulitis Oncologic Medical History: Negative for: Received Chemotherapy; Received Radiation Past Medical History Notes: 2008 rectal Ca Immunizations Pneumococcal Vaccine: Received Pneumococcal Vaccination: No Implantable Devices No devices added Hospitalization / Surgery History Type of Hospitalization/Surgery 2023 SBO, inguinal hernia, gangrene, FTT- bowel resection tonsillectomy and adenoidectomy Family and Social History Never smoker; Alcohol Use: Never; Drug Use: No  History; Caffeine Use: Never; Financial Concerns: No; Food, Clothing or Shelter Needs: No; Support System Lacking: No; Transportation Concerns: No Electronic Signature(s) Signed: 03/14/2023 5:29:38 PM By: Geralyn Corwin DO Entered By: Geralyn Corwin on 03/14/2023 14:02:06 [name] ([id number]) [id number] Page 7 of 7 -------------------------------------------------------------------------------- SuperBill Details Patient Name: Date of ServiceCorinna Faulkner, [name] RNA 03/14/2023 Medical Record Number: [id number] Patient Account Number: [id number] Date of Birth/Sex: Treating RN: 04/05/1924 (89 y.o. 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 I10 Essential (primary) hypertension Facility Procedures : CPT4 Code: [id number] Description: 64403 - WOUND CARE VISIT-LEV 2 EST PT Modifier: Quantity: 1 Physician Procedures : CPT4 Code Description Modifier [id number] - WC PHYS LEVEL 3 - EST PT ICD-10 Diagnosis Description L97.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

## 2023-03-20 NOTE — Progress Notes (Signed)
[name] ([id number]) [id number] Page 1 of 8 Visit Report for 03/14/2023 Arrival Information Details Patient Name: Date of ServiceCorinna Faulkner, [name] RNA 03/14/2023 2:45 PM Medical Record Number: [id number] Patient Account Number: [id number] Date of Birth/Sex: Treating RN: [birth year] (89 y.o. F) Primary Care [name]: [name] Other Clinician: Referring [name]: Treating [name]/Extender: [name], [name] in Treatment: 17 Visit Information History Since Last Visit Added or deleted any medications: No Patient Arrived: Ambulatory Any new allergies or adverse reactions: No Arrival Time: 14:36 Had a fall or experienced change in No Accompanied By: daughter activities of daily living that may affect Transfer Assistance: None risk of falls: Patient Identification Verified: Yes Signs or symptoms of abuse/neglect since last visito No Secondary Verification Process Completed: Yes Hospitalized since last visit: No Patient Requires Transmission-Based Precautions: No Implantable device outside of the clinic excluding No Patient Has Alerts: Yes cellular tissue based products placed in the center Patient Alerts: ABI R 0.96 (11/29/22) since last visit: ABI L 1.25 (11/29/22) Has Dressing in Place as Prescribed: Yes Pain Present Now: No Electronic Signature(s) Signed: 03/20/2023 4:43:35 PM By: Thayer Dallas Entered By: Thayer Dallas on 03/14/2023 14:37:10 -------------------------------------------------------------------------------- Clinic Level of Care Assessment Details Patient Name: Date of ServiceTorrie Faulkner RNA 03/14/2023 2:45 PM Medical Record Number: [id number] Patient Account Number: [id number] Date of Birth/Sex: Treating RN: 05-09-1924 (89 y.o. [name] Primary Care [name]: [name] Other Clinician: Referring [name]: Treating [name]/Extender: [name] in Treatment: 17 Clinic  Level of Care Assessment Items TOOL 4 Quantity Score X- 1 0 Use when only an EandM is performed on FOLLOW-UP visit ASSESSMENTS - Nursing Assessment / Reassessment X- 1 10 Reassessment of Co-morbidities (includes updates in patient status) X- 1 5 Reassessment of Adherence to Treatment Plan ASSESSMENTS - Wound and Skin A ssessment / Reassessment X - Simple Wound Assessment / Reassessment - one wound 1 5 []  - 0 Complex Wound Assessment / Reassessment - multiple wounds []  - 0 Dermatologic / Skin Assessment (not related to wound area) ASSESSMENTS - Focused Assessment X- 1 5 Circumferential Edema Measurements - multi extremities []  - 0 Nutritional Assessment / Counseling / Intervention [name] ([id number]) [id number] Page 2 of 8 []  - 0 Lower Extremity Assessment (monofilament, tuning fork, pulses) []  - 0 Peripheral Arterial Disease Assessment (using hand held doppler) ASSESSMENTS - Ostomy and/or Continence Assessment and Care []  - 0 Incontinence Assessment and Management []  - 0 Ostomy Care Assessment and Management (repouching, etc.) PROCESS - Coordination of Care []  - 0 Simple Patient / Family Education for ongoing care []  - 0 Complex (extensive) Patient / Family Education for ongoing care X- 1 10 Staff obtains Chiropractor, Records, T Results / Process Orders est []  - 0 Staff telephones HHA, Nursing Homes / Clarify orders / etc []  - 0 Routine Transfer to another Facility (non-emergent condition) []  - 0 Routine Hospital Admission (non-emergent condition) []  - 0 New Admissions / Manufacturing engineer / Ordering NPWT Apligraf, etc. , []  - 0 Emergency Hospital Admission (emergent condition) X- 1 10 Simple Discharge Coordination []  - 0 Complex (extensive) Discharge Coordination PROCESS - Special Needs []  - 0 Pediatric / Minor Patient Management []  - 0 Isolation Patient Management []  - 0 Hearing / Language / Visual special needs []  -  0 Assessment of Community assistance (transportation, D/C planning, etc.) []  - 0 Additional assistance / Altered mentation []  - 0 Support Surface(s) Assessment (bed, cushion, seat, etc.) INTERVENTIONS - Wound Cleansing / Measurement X -  Simple Wound Cleansing - one wound 1 5 []  - 0 Complex Wound Cleansing - multiple wounds X- 1 5 Wound Imaging (photographs - any number of wounds) []  - 0 Wound Tracing (instead of photographs) X- 1 5 Simple Wound Measurement - one wound []  - 0 Complex Wound Measurement - multiple wounds INTERVENTIONS - Wound Dressings X - Small Wound Dressing one or multiple wounds 1 10 []  - 0 Medium Wound Dressing one or multiple wounds []  - 0 Large Wound Dressing one or multiple wounds []  - 0 Application of Medications - topical []  - 0 Application of Medications - injection INTERVENTIONS - Miscellaneous []  - 0 External ear exam []  - 0 Specimen Collection (cultures, biopsies, blood, body fluids, etc.) []  - 0 Specimen(s) / Culture(s) sent or taken to Lab for analysis []  - 0 Patient Transfer (multiple staff / Nurse, adult / Similar devices) []  - 0 Simple Staple / Suture removal (25 or less) []  - 0 Complex Staple / Suture removal (26 or more) []  - 0 Hypo / Hyperglycemic Management (close monitor of Blood Glucose) [name] ([id number]) [phone].pdf Page 3 of 8 []  - 0 Ankle / Brachial Index (ABI) - do not check if billed separately X- 1 5 Vital Signs Has the patient been seen at the hospital within the last three years: Yes Total Score: 75 Level Of Care: New/Established - Level 2 Electronic Signature(s) Signed: 03/14/2023 3:54:16 PM By: Redmond Pulling RN, BSN Entered By: Redmond Pulling on 03/14/2023 15:10:33 -------------------------------------------------------------------------------- Encounter Discharge Information Details Patient Name: Date of Service: [name], [place] RNA 03/14/2023 2:45 PM Medical Record Number:  [id number] Patient Account Number: [id number] Date of Birth/Sex: Treating RN: [birth year] (89 y.o. [name] Primary Care Aryeh Butterfield: [name] Other Clinician: Referring Elah Avellino: Treating Jasmia Angst/Extender: [name] in Treatment: 58 Encounter Discharge Information Items Discharge Condition: Stable Ambulatory Status: Ambulatory Discharge Destination: Home Transportation: Private Auto Accompanied By: self Schedule Follow-up Appointment: Yes Clinical Summary of Care: Patient Declined Electronic Signature(s) Signed: 03/14/2023 3:54:16 PM By: Redmond Pulling RN, BSN Entered By: Redmond Pulling on 03/14/2023 15:04:26 -------------------------------------------------------------------------------- Lower Extremity Assessment Details Patient Name: Date of Service: [name], [place] RNA 03/14/2023 2:45 PM Medical Record Number: [id number] Patient Account Number: [id number] Date of Birth/Sex: Treating RN: 30-Aug-1923 (89 y.o. F) Primary Care Orvile Corona: [name] Other Clinician: Referring Kymia Simi: Treating Sharonlee Nine/Extender: [name], [name] in Treatment: 17 Edema Assessment Assessed: [Left: No] [Right: No] Edema: [Left: N] [Right: o] Calf Left: Right: Point of Measurement: 31 cm From Medial Instep 29 cm Ankle Left: Right: Point of Measurement: 10 cm From Medial Instep 18 cm Vascular Assessment [name] ([id number]) [Right:[id number] Page 4 of 8] Pulses: Dorsalis Pedis Palpable: [Right:Yes] Extremity colors, hair growth, and conditions: Extremity Color: [Right:Hyperpigmented] Hair Growth on Extremity: [Right:No] Temperature of Extremity: [Right:Warm] Capillary Refill: [Right:< 3 seconds] Dependent Rubor: [Right:No No] Toe Nail Assessment Left: Right: Thick: No Discolored: No Deformed: No Improper Length and Hygiene: Yes Electronic Signature(s) Signed: 03/20/2023 4:43:35 PM By: Thayer Dallas Entered By: Thayer Dallas on 03/14/2023 14:41:03 -------------------------------------------------------------------------------- Multi Wound Chart Details Patient Name: Date of Service: [name], LO RNA 03/14/2023 2:45 PM Medical Record Number: [id number] Patient Account Number: [id number] Date of Birth/Sex: Treating RN: 1923/08/26 (89 y.o. F) Primary Care Petra Dumler: [name] Other Clinician: Referring Olamae Ferrara: Treating Danny Yackley/Extender: [name], [name] in Treatment: 17 Vital Signs Height(in): 62 Pulse(bpm): 81 Weight(lbs): 120 Blood Pressure(mmHg): 145/73 Body Mass Index(BMI): 21.9 Temperature(F): 98.0 Respiratory Rate(breaths/min): 18 [2:Photos:] [N/A:N/A] Right,  Medial Ankle N/A N/A Wound Location: Gradually Appeared N/A N/A Wounding Event: Venous Leg Ulcer N/A N/A Primary Etiology: Asthma, Hypertension N/A N/A Comorbid History: 06/08/2022 N/A N/A Date Acquired: 80 N/A N/A Faulkner of Treatment: Healed - Epithelialized N/A N/A Wound Status: No N/A N/A Wound Recurrence: 0x0x0 N/A N/A Measurements L x W x D (cm) 0 N/A N/A A (cm) : rea 0 N/A N/A Volume (cm) : 100.00% N/A N/A % Reduction in A rea: 100.00% N/A N/A % Reduction in Volume: Full Thickness Without Exposed N/A N/A Classification: Support Structures None Present N/A N/A Exudate Amount: None Present (0%) N/A N/A Granulation Amount: None Present (0%) N/A N/A Necrotic Amount: Fascia: No N/A N/A Exposed Structures: [name] ([id number]) [id number] Page 5 of 8 Fat Layer (Subcutaneous Tissue): No Tendon: No Muscle: No Joint: No Bone: No Large (67-100%) N/A N/A Epithelialization: Erythema: Yes N/A N/A Periwound Skin Color: Circumferential N/A N/A Erythema Location: No Abnormality N/A N/A Temperature: Treatment Notes Electronic Signature(s) Signed: 03/14/2023 5:29:38 PM By: Geralyn Corwin DO Entered By: Geralyn Corwin on  03/14/2023 17:01:09 -------------------------------------------------------------------------------- Multi-Disciplinary Care Plan Details Patient Name: Date of Service: [name], LO RNA 03/14/2023 2:45 PM Medical Record Number: [id number] Patient Account Number: [id number] Date of Birth/Sex: Treating RN: 1924/03/03 (89 y.o. [name] Primary Care Dhani Dannemiller: [name] Other Clinician: Referring Lucyle Alumbaugh: Treating Krishay Faro/Extender: [name] in Treatment: 17 Active Inactive Electronic Signature(s) Signed: 03/14/2023 3:54:16 PM By: Redmond Pulling RN, BSN Entered By: Redmond Pulling on 03/14/2023 15:03:07 -------------------------------------------------------------------------------- Pain Assessment Details Patient Name: Date of Service: [name], LO RNA 03/14/2023 2:45 PM Medical Record Number: [id number] Patient Account Number: [id number] Date of Birth/Sex: Treating RN: 04-29-1924 (89 y.o. F) Primary Care Takeshi Teasdale: [name] Other Clinician: Referring Burna Atlas: Treating Jamae Tison/Extender: [name], [name] in Treatment: 17 Active Problems Location of Pain Severity and Description of Pain Patient Has Paino No Site Locations [place], [place] ([id number]) [id number] Page 6 of 8 Pain Management and Medication Current Pain Management: Notes has pain at night Electronic Signature(s) Signed: 03/20/2023 4:43:35 PM By: Thayer Dallas Entered By: Thayer Dallas on 03/14/2023 14:37:27 -------------------------------------------------------------------------------- Patient/Caregiver Education Details Patient Name: Date of Service: [name], Marton Redwood RNA 9/5/2024andnbsp2:45 PM Medical Record Number: [id number] Patient Account Number: [id number] Date of Birth/Gender: Treating RN: 1924/02/16 (89 y.o. [name] Primary Care Physician: [name] Other Clinician: Referring Physician: Treating  Physician/Extender: [name] in Treatment: 77 Education Assessment Education Provided To: Patient Education Topics Provided Wound/Skin Impairment: Methods: Explain/Verbal Responses: State content correctly [employer]) Signed: 03/14/2023 3:54:16 PM By: Redmond Pulling RN, BSN Entered By: Redmond Pulling on 03/14/2023 15:03:22 -------------------------------------------------------------------------------- Wound Assessment Details Patient Name: Date of Service: [name], LO RNA 03/14/2023 2:45 PM Medical Record Number: [id number] Patient Account Number: [id number] STORI, PARAYNO ([id number]) [id number] Page 7 of 8 Date of Birth/Sex: Treating RN: 08-06-1923 (89 y.o. [name] Primary Care Brason Berthelot: Other Clinician: Ardean Faulkner Referring Briley Sulton: Treating Demi Trieu/Extender: [name], [name] in Treatment: 17 Wound Status Wound Number: 2 Primary Etiology: Venous Leg Ulcer Wound Location: Right, Medial Ankle Wound Status: Healed - Epithelialized Wounding Event: Gradually Appeared Comorbid History: Asthma, Hypertension Date Acquired: 06/08/2022 Faulkner Of Treatment: 17 Clustered Wound: No Photos Wound Measurements Length: (cm) Width: (cm) Depth: (cm) Area: (cm) Volume: (cm) 0 % Reduction in Area: 100% 0 % Reduction in Volume: 100% 0 Epithelialization: Large (67-100%) 0 Tunneling: No 0 Undermining: No Wound Description Classification: Full Thickness Without Exposed Suppor Exudate Amount: None Present t  Structures Foul Odor After Cleansing: No Slough/Fibrino No Wound Bed Granulation Amount: None Present (0%) Exposed Structure Necrotic Amount: None Present (0%) Fascia Exposed: No Fat Layer (Subcutaneous Tissue) Exposed: No Tendon Exposed: No Muscle Exposed: No Joint Exposed: No Bone Exposed: No Periwound Skin Texture Texture Color No Abnormalities Noted: No No Abnormalities  Noted: No Erythema: Yes Moisture Erythema Location: Circumferential No Abnormalities Noted: No Temperature / Pain Temperature: No Abnormality Electronic Signature(s) Signed: 03/14/2023 3:54:16 PM By: Redmond Pulling RN, BSN Entered By: Redmond Pulling on 03/14/2023 15:01:30 -------------------------------------------------------------------------------- Vitals Details Patient Name: Date of Service: [name], [place] RNA 03/14/2023 2:45 PM Medical Record Number: [id number] Patient Account Number: [id number] Date of Birth/Sex: Treating RN: 03/14/1924 (89 y.o. Arcelia Jew, [name] ([id number]) [id number] Page 8 of 8 Primary Care Arelys Glassco: [name] Other Clinician: Referring Ocie Stanzione: Treating Malanie Koloski/Extender: [name], [name] in Treatment: 17 Vital Signs Time Taken: 14:44 Temperature (F): 98.0 Height (in): 62 Pulse (bpm): 81 Weight (lbs): 120 Respiratory Rate (breaths/min): 18 Body Mass Index (BMI): 21.9 Blood Pressure (mmHg): 145/73 Reference Range: 80 - 120 mg / dl Electronic Signature(s) Signed: 03/20/2023 4:43:35 PM By: Thayer Dallas Entered By: Thayer Dallas on 03/14/2023 14:44:58

## 2023-09-19 ENCOUNTER — Encounter (HOSPITAL_BASED_OUTPATIENT_CLINIC_OR_DEPARTMENT_OTHER): Attending: Internal Medicine | Admitting: Internal Medicine

## 2023-09-19 DIAGNOSIS — L03115 Cellulitis of right lower limb: Secondary | ICD-10-CM | POA: Diagnosis not present

## 2023-09-19 DIAGNOSIS — L97818 Non-pressure chronic ulcer of other part of right lower leg with other specified severity: Secondary | ICD-10-CM | POA: Insufficient documentation

## 2023-09-19 DIAGNOSIS — I739 Peripheral vascular disease, unspecified: Secondary | ICD-10-CM | POA: Diagnosis not present

## 2023-09-19 DIAGNOSIS — T798XXA Other early complications of trauma, initial encounter: Secondary | ICD-10-CM | POA: Diagnosis not present

## 2023-09-19 DIAGNOSIS — I87331 Chronic venous hypertension (idiopathic) with ulcer and inflammation of right lower extremity: Secondary | ICD-10-CM | POA: Insufficient documentation

## 2023-09-26 ENCOUNTER — Encounter (HOSPITAL_BASED_OUTPATIENT_CLINIC_OR_DEPARTMENT_OTHER): Admitting: Internal Medicine

## 2023-09-26 DIAGNOSIS — L97818 Non-pressure chronic ulcer of other part of right lower leg with other specified severity: Secondary | ICD-10-CM | POA: Diagnosis not present

## 2023-09-26 DIAGNOSIS — I87331 Chronic venous hypertension (idiopathic) with ulcer and inflammation of right lower extremity: Secondary | ICD-10-CM

## 2023-10-02 ENCOUNTER — Ambulatory Visit (HOSPITAL_BASED_OUTPATIENT_CLINIC_OR_DEPARTMENT_OTHER): Admitting: Internal Medicine

## 2023-10-07 ENCOUNTER — Encounter (HOSPITAL_BASED_OUTPATIENT_CLINIC_OR_DEPARTMENT_OTHER): Admitting: Internal Medicine

## 2023-10-07 DIAGNOSIS — T798XXA Other early complications of trauma, initial encounter: Secondary | ICD-10-CM | POA: Diagnosis not present

## 2023-10-07 DIAGNOSIS — I87331 Chronic venous hypertension (idiopathic) with ulcer and inflammation of right lower extremity: Secondary | ICD-10-CM

## 2023-10-07 DIAGNOSIS — L97818 Non-pressure chronic ulcer of other part of right lower leg with other specified severity: Secondary | ICD-10-CM | POA: Diagnosis not present

## 2023-10-21 ENCOUNTER — Encounter (HOSPITAL_BASED_OUTPATIENT_CLINIC_OR_DEPARTMENT_OTHER): Payer: 59 | Admitting: Internal Medicine

## 2024-05-06 ENCOUNTER — Ambulatory Visit (HOSPITAL_BASED_OUTPATIENT_CLINIC_OR_DEPARTMENT_OTHER): Admitting: General Surgery

## 2024-05-27 ENCOUNTER — Encounter (HOSPITAL_BASED_OUTPATIENT_CLINIC_OR_DEPARTMENT_OTHER): Attending: General Surgery | Admitting: General Surgery

## 2024-05-27 DIAGNOSIS — I739 Peripheral vascular disease, unspecified: Secondary | ICD-10-CM | POA: Insufficient documentation

## 2024-05-27 DIAGNOSIS — I890 Lymphedema, not elsewhere classified: Secondary | ICD-10-CM | POA: Insufficient documentation

## 2024-05-27 DIAGNOSIS — L97319 Non-pressure chronic ulcer of right ankle with unspecified severity: Secondary | ICD-10-CM | POA: Insufficient documentation

## 2024-05-27 DIAGNOSIS — L97519 Non-pressure chronic ulcer of other part of right foot with unspecified severity: Secondary | ICD-10-CM | POA: Insufficient documentation

## 2024-05-27 DIAGNOSIS — I872 Venous insufficiency (chronic) (peripheral): Secondary | ICD-10-CM | POA: Insufficient documentation

## 2024-06-11 ENCOUNTER — Encounter (HOSPITAL_BASED_OUTPATIENT_CLINIC_OR_DEPARTMENT_OTHER): Admitting: General Surgery

## 2024-06-11 DIAGNOSIS — Z85048 Personal history of other malignant neoplasm of rectum, rectosigmoid junction, and anus: Secondary | ICD-10-CM | POA: Diagnosis not present

## 2024-06-11 DIAGNOSIS — H9190 Unspecified hearing loss, unspecified ear: Secondary | ICD-10-CM | POA: Diagnosis not present

## 2024-06-11 DIAGNOSIS — I890 Lymphedema, not elsewhere classified: Secondary | ICD-10-CM | POA: Diagnosis not present

## 2024-06-11 DIAGNOSIS — I739 Peripheral vascular disease, unspecified: Secondary | ICD-10-CM | POA: Insufficient documentation

## 2024-06-11 DIAGNOSIS — Z8719 Personal history of other diseases of the digestive system: Secondary | ICD-10-CM | POA: Insufficient documentation

## 2024-06-11 DIAGNOSIS — Z9049 Acquired absence of other specified parts of digestive tract: Secondary | ICD-10-CM | POA: Insufficient documentation

## 2024-06-11 DIAGNOSIS — I872 Venous insufficiency (chronic) (peripheral): Secondary | ICD-10-CM | POA: Diagnosis not present

## 2024-06-11 DIAGNOSIS — L97319 Non-pressure chronic ulcer of right ankle with unspecified severity: Secondary | ICD-10-CM | POA: Insufficient documentation

## 2024-06-11 DIAGNOSIS — L97519 Non-pressure chronic ulcer of other part of right foot with unspecified severity: Secondary | ICD-10-CM | POA: Insufficient documentation

## 2024-06-29 ENCOUNTER — Encounter (HOSPITAL_BASED_OUTPATIENT_CLINIC_OR_DEPARTMENT_OTHER): Admitting: General Surgery

## 2024-06-29 DIAGNOSIS — L97519 Non-pressure chronic ulcer of other part of right foot with unspecified severity: Secondary | ICD-10-CM | POA: Diagnosis not present

## 2024-07-13 ENCOUNTER — Encounter (HOSPITAL_BASED_OUTPATIENT_CLINIC_OR_DEPARTMENT_OTHER): Attending: General Surgery | Admitting: General Surgery

## 2024-07-30 ENCOUNTER — Other Ambulatory Visit: Payer: Self-pay
